# Patient Record
Sex: Female | Born: 1995
Health system: Southern US, Community
[De-identification: ages and names within clinical notes are randomized; demographics above are authoritative.]

## PROBLEM LIST (undated history)

## (undated) ENCOUNTER — Ambulatory Visit: Admission: EM | Discharge status: Absent without leave | Payer: BC Managed Care – PPO

## (undated) ENCOUNTER — Inpatient Hospital Stay (HOSPITAL_COMMUNITY): Payer: Self-pay

## (undated) DIAGNOSIS — F419 Anxiety disorder, unspecified: Secondary | ICD-10-CM

## (undated) DIAGNOSIS — F32A Depression, unspecified: Secondary | ICD-10-CM

## (undated) DIAGNOSIS — O039 Complete or unspecified spontaneous abortion without complication: Secondary | ICD-10-CM

## (undated) DIAGNOSIS — K219 Gastro-esophageal reflux disease without esophagitis: Secondary | ICD-10-CM

## (undated) DIAGNOSIS — R42 Dizziness and giddiness: Secondary | ICD-10-CM

## (undated) DIAGNOSIS — Z309 Encounter for contraceptive management, unspecified: Secondary | ICD-10-CM

## (undated) HISTORY — DX: Complete or unspecified spontaneous abortion without complication: O03.9

## (undated) HISTORY — DX: Anxiety disorder, unspecified: F41.9

## (undated) HISTORY — DX: Dizziness and giddiness: R42

## (undated) HISTORY — DX: Depression, unspecified: F32.A

## (undated) HISTORY — DX: Gastro-esophageal reflux disease without esophagitis: K21.9

## (undated) HISTORY — DX: Encounter for contraceptive management, unspecified: Z30.9

---
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
## 2016-02-15 ENCOUNTER — Ambulatory Visit (INDEPENDENT_AMBULATORY_CARE_PROVIDER_SITE_OTHER): Payer: BLUE CROSS/BLUE SHIELD | Admitting: Orthopedic Surgery

## 2016-02-15 ENCOUNTER — Ambulatory Visit (INDEPENDENT_AMBULATORY_CARE_PROVIDER_SITE_OTHER): Payer: BLUE CROSS/BLUE SHIELD

## 2016-02-15 ENCOUNTER — Encounter: Payer: Self-pay | Admitting: Orthopedic Surgery

## 2016-02-15 VITALS — BP 110/75 | Ht 62.0 in | Wt 140.0 lb

## 2016-02-15 DIAGNOSIS — M25531 Pain in right wrist: Secondary | ICD-10-CM

## 2016-02-15 DIAGNOSIS — M25331 Other instability, right wrist: Secondary | ICD-10-CM

## 2016-02-15 MED ORDER — ETODOLAC 300 MG PO CAPS
300.0000 mg | ORAL_CAPSULE | Freq: Every day | ORAL | Status: DC
Start: 2016-02-15 — End: 2016-04-25

## 2016-02-15 NOTE — Patient Instructions (Addendum)
We will schedule MRI for you and call you with appt New medication sent to your pharmacy

## 2016-02-15 NOTE — Progress Notes (Signed)
Patient ID: Morgan [name], female   DOB: [birth year], 20 y.o.   MRN: [id number]  Chief Complaint  Patient presents with  . Wrist Pain    Rt wrist pain, no recent injury    HPI Morgan [name] is a 20 y.o. female.  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 She has popping dorsal wrist pain catching giving way inability to hold her hand in extension. She's been on Aleve off and on for 3 years and consistently for 3 weeks nothing is made it better at this point    Revi ew of Systems Review of Systems  She denies any medical problems   No previous surgeries   Family History  Problem Relation Age of Onset  . Diabetes    . Diabetes Paternal Aunt   . Cancer Paternal Aunt   . Diabetes Maternal Grandmother   . Cancer Paternal Grandmother     Social History Social History  Substance Use Topics  . Smoking status: Never Smoker   . Smokeless tobacco: Never Used  . Alcohol Use: No    No Known Allergies  Current Outpatient Prescriptions  Medication Sig Dispense Refill  . norethindrone-ethinyl estradiol (JUNEL FE 1/20) 1-20 MG-MCG tablet Take 1 tablet by mouth daily. 1 Package 11   No current facility-administered medications for this visit.       Physical Exam Physical Exam Blood pressure 110/75, height  (1.575 m), weight 140 lb (63.504 kg). Appearance, there are no abnormalities in terms of appearance the patient was well-developed and well-nourished. The grooming and hygiene were normal.  Mental status orientation, there was normal alertness and orientation Mood pleasant Ambulatory status normal with no assistive  devices  Examination of theright wrist is examined first.  nspecTenderness over the dorsal wrist joint  range of motionpainful but full range of motion pain with extension  tests for stabilWatson test is positive motor strength  5/5 Skin warm dry and intact without laceration or ulceration or erythema Neurologic examination normal sensation Vascular examination normal pulses with warm extremity and normal capillary refill  The opposite extremleft wrist normal range of motion stability and strength normal alignment  Data Review  x-ray show signet ring sign but normal lunate alignment  Right wrist instability  Plan  Splint, Lodine 300 mg daily, MRI with arthrogram right wrist

## 2016-02-21 ENCOUNTER — Telehealth: Payer: Self-pay | Admitting: *Deleted

## 2016-02-21 NOTE — Telephone Encounter (Signed)
PATIENT'S INSURANCE DENIED MRI

## 2016-04-03 ENCOUNTER — Encounter: Payer: Self-pay | Admitting: Adult Health

## 2016-04-03 ENCOUNTER — Ambulatory Visit: Payer: Self-pay | Admitting: Adult Health

## 2016-04-25 ENCOUNTER — Ambulatory Visit (INDEPENDENT_AMBULATORY_CARE_PROVIDER_SITE_OTHER): Payer: BLUE CROSS/BLUE SHIELD | Admitting: Adult Health

## 2016-04-25 ENCOUNTER — Ambulatory Visit: Payer: Self-pay | Admitting: Adult Health

## 2016-04-25 ENCOUNTER — Encounter: Payer: Self-pay | Admitting: Adult Health

## 2016-04-25 VITALS — BP 130/60 | HR 82 | Ht 62.0 in | Wt 143.0 lb

## 2016-04-25 DIAGNOSIS — Z30013 Encounter for initial prescription of injectable contraceptive: Secondary | ICD-10-CM | POA: Diagnosis not present

## 2016-04-25 DIAGNOSIS — Z309 Encounter for contraceptive management, unspecified: Secondary | ICD-10-CM

## 2016-04-25 DIAGNOSIS — Z113 Encounter for screening for infections with a predominantly sexual mode of transmission: Secondary | ICD-10-CM

## 2016-04-25 HISTORY — DX: Encounter for contraceptive management, unspecified: Z30.9

## 2016-04-25 MED ORDER — MEDROXYPROGESTERONE ACETATE 150 MG/ML IM SUSP: 150.0000 mg | INTRAMUSCULAR | Status: DC

## 2016-04-25 NOTE — Progress Notes (Signed)
Subjective:     Patient ID: Morgan [name], female   DOB: 03-Apr-1996, 20 y.o.   MRN: [id number]  HPI Macario CarlsKelsea is a 20 year old biracial female, in wanting to switch from OCs to depo, she forgets to take the pill sometimes, she says she has not had sex since first of April and would like to be checked for GC/CHL. Denies any discharge, itching or burning or odor. See graduates this year.  Review of Systems Patient denies any headaches, hearing loss, fatigue, blurred vision, shortness of breath, chest pain, abdominal pain, problems with bowel movements, urination, or intercourse. No joint pain or mood swings. See HPI. Reviewed past medical,surgical, social and family history. Reviewed medications and allergies.     Objective:   Physical Exam BP 130/60 mmHg  Pulse 82  Ht 5\' 2"  (1.575 m)  Wt 143 lb (64.864 kg)  BMI 26.15 kg/m2  LMP 04/09/2016 Skin warm and dry, no rashes. Neck: mid line trachea, normal thyroid, good ROM, no lymphadenopathy noted. Lungs: clear to ausculation bilaterally. Cardiovascular: regular rate and rhythm. Pelvic: external genitalia is normal in appearance no lesions, vagina:pink with good moisture and rugae,urethra has no lesions or masses noted, cervix:smooth, uterus: normal size, shape and contour, non tender, no masses felt, adnexa: no masses or tenderness noted. Bladder is non tender and no masses felt.  GC/CHL obtained.     Assessment:     Contraceptive management STD screening    Plan:    No sex Continue OCs GC/CHL sent  Rx Depoprovera 150 mg # 1 vial, with 4 refills for Im injection every 12 weeks [facility] am and return in pm for depo(lab order given) Use condoms when has sex

## 2016-04-25 NOTE — Patient Instructions (Signed)
No sex Return in 1 week for stat QHCg in am and depo in pm

## 2016-04-27 LAB — GC/CHLAMYDIA PROBE AMP
Chlamydia trachomatis, NAA: NEGATIVE
Neisseria gonorrhoeae by PCR: NEGATIVE

## 2016-05-02 ENCOUNTER — Ambulatory Visit: Payer: BLUE CROSS/BLUE SHIELD

## 2016-05-02 DIAGNOSIS — Z30013 Encounter for initial prescription of injectable contraceptive: Secondary | ICD-10-CM | POA: Diagnosis not present

## 2016-05-02 LAB — BETA HCG QUANT (REF LAB): hCG Quant: <1 m[IU]/mL

## 2016-08-28 ENCOUNTER — Encounter: Payer: Self-pay | Admitting: Adult Health

## 2016-08-28 ENCOUNTER — Ambulatory Visit (INDEPENDENT_AMBULATORY_CARE_PROVIDER_SITE_OTHER): Payer: BLUE CROSS/BLUE SHIELD | Admitting: Adult Health

## 2016-08-28 VITALS — BP 108/50 | HR 78 | Ht 62.0 in | Wt 141.0 lb

## 2016-08-28 DIAGNOSIS — Z3202 Encounter for pregnancy test, result negative: Secondary | ICD-10-CM | POA: Diagnosis not present

## 2016-08-28 DIAGNOSIS — Z3009 Encounter for other general counseling and advice on contraception: Secondary | ICD-10-CM

## 2016-08-28 DIAGNOSIS — Z113 Encounter for screening for infections with a predominantly sexual mode of transmission: Secondary | ICD-10-CM

## 2016-08-28 LAB — POCT URINE PREGNANCY: Preg Test, Ur: NEGATIVE

## 2016-08-28 NOTE — Patient Instructions (Signed)
Call with period for nexplanon insertion

## 2016-08-28 NOTE — Progress Notes (Signed)
Subjective:     Patient ID: Morgan [name], female   DOB: 11/28/1996, 20 y.o.   MRN: [id number]  HPI Morgan [name] is a 20 year old biracial female, in for STD screening and wants to talk birth control options, either wants nexplanon or depo.   Review of Systems Patient denies any headaches, hearing loss, fatigue, blurred vision, shortness of breath, chest pain, abdominal pain, problems with bowel movements, urination, or intercourse. No joint pain or mood swings.   Reviewed past medical,surgical, social and family history. Reviewed medications and allergies.  Objective:   Physical Exam BP (!) 108/50 (BP Location: Left Arm, Patient Position: Sitting, Cuff Size: Normal)   Pulse 78   Ht 5\' 2"  (1.575 m)   Wt 141 lb (64 kg)   LMP 07/17/2016   BMI 25.79 kg/m UPT negative.  Skin warm and dry.Pelvic: external genitalia is normal in appearance, has razor bumps, vagina: scant discharge without odor,urethra has no lesions or masses noted, cervix:smooth, uterus: normal size, shape and contour, non tender, no masses felt, adnexa: no masses or tenderness noted. Bladder is non tender and no masses felt.  GC/CHL obtained.    Discussed nexplanon and depo, she has had depo before but wants something that is covered by insurance.Use clippers and bar soap in peri area, do not shave.  Face time 15 minutes, with 50% counseling.   Assessment:     Screening examination for STD (sexually transmitted disease) - Plan: GC/Chlamydia Probe Amp, HIV antibody, HSV 2 antibody, IgG, RPR  Encounter for education about contraceptive use  Pregnancy examination or test, negative result - Plan: POCT urine pregnancy     Plan:     GC/CHL sent Check HIV,RPR and HSV 2 Call with next period for nexplanon insertion Will have Angie check insurance on nexplanon and depo   Review handout on nexplanon

## 2016-08-29 LAB — GC/CHLAMYDIA PROBE AMP
Chlamydia trachomatis, NAA: NEGATIVE
NEISSERIA GONORRHOEAE BY PCR: NEGATIVE

## 2016-08-29 LAB — RPR: RPR: NONREACTIVE

## 2016-08-29 LAB — HIV ANTIBODY (ROUTINE TESTING W REFLEX): HIV SCREEN 4TH GENERATION: NONREACTIVE

## 2016-08-29 LAB — HSV 2 ANTIBODY, IGG

## 2016-10-08 ENCOUNTER — Encounter (HOSPITAL_COMMUNITY): Payer: Self-pay | Admitting: Emergency Medicine

## 2016-10-08 ENCOUNTER — Emergency Department (HOSPITAL_COMMUNITY): Payer: BLUE CROSS/BLUE SHIELD

## 2016-10-08 ENCOUNTER — Emergency Department (HOSPITAL_COMMUNITY)
Admission: EM | Admit: 2016-10-08 | Discharge: 2016-10-08 | Disposition: A | Discharge status: Home / Self Care | Payer: BLUE CROSS/BLUE SHIELD | Attending: Emergency Medicine | Admitting: Emergency Medicine

## 2016-10-08 DIAGNOSIS — J209 Acute bronchitis, unspecified: Secondary | ICD-10-CM | POA: Diagnosis not present

## 2016-10-08 DIAGNOSIS — J40 Bronchitis, not specified as acute or chronic: Secondary | ICD-10-CM | POA: Diagnosis not present

## 2016-10-08 DIAGNOSIS — R0602 Shortness of breath: Secondary | ICD-10-CM | POA: Diagnosis not present

## 2016-10-08 DIAGNOSIS — R05 Cough: Secondary | ICD-10-CM | POA: Diagnosis not present

## 2016-10-08 DIAGNOSIS — J9801 Acute bronchospasm: Secondary | ICD-10-CM | POA: Diagnosis not present

## 2016-10-08 MED ORDER — ALBUTEROL SULFATE HFA 108 (90 BASE) MCG/ACT IN AERS
2.0000 | INHALATION_SPRAY | Freq: Once | RESPIRATORY_TRACT | Status: AC
  Administered 2016-10-08: 2 via RESPIRATORY_TRACT
  Filled 2016-10-08: qty 6.7

## 2016-10-08 MED ORDER — DEXAMETHASONE 4 MG PO TABS: 4.0000 mg | ORAL_TABLET | Freq: Two times a day (BID) | ORAL | 0 refills | Status: DC

## 2016-10-08 MED ORDER — PREDNISONE 20 MG PO TABS
40.0000 mg | ORAL_TABLET | Freq: Once | ORAL | Status: AC
  Administered 2016-10-08: 40 mg via ORAL
  Filled 2016-10-08: qty 2

## 2016-10-08 MED ORDER — PROMETHAZINE-PHENYLEPH-CODEINE 6.25-5-10 MG/5ML PO SYRP: 5.0000 mL | ORAL_SOLUTION | Freq: Four times a day (QID) | ORAL | 0 refills | Status: DC

## 2016-10-08 MED ORDER — ONDANSETRON HCL 4 MG PO TABS
4.0000 mg | ORAL_TABLET | Freq: Once | ORAL | Status: AC
  Administered 2016-10-08: 4 mg via ORAL
  Filled 2016-10-08: qty 1

## 2016-10-08 NOTE — Discharge Instructions (Signed)
Your oxygen level is 100%. You seem to have some spasm with prolonged coughing. Please use 2 puffs of albuterol every 4 hours as needed for cough or spasm. Please use Decadron 2 times daily until all taken. Use promethazine codeine cough medication for cough. This medication may cause drowsiness, please use with caution. Please see Dr Rulon SeraE. Hawkins for lung evaluation/management if symptoms continue after using the medications prescribed.

## 2016-10-08 NOTE — ED Triage Notes (Signed)
Pt reports cough x 2 months, non productive. Pt states she has difficulty catching her breath after she coughs, sometimes causing her to vomit.

## 2016-10-08 NOTE — ED Provider Notes (Signed)
[facility] DEPT Provider Note   CSN: [id number] Arrival date & time: 10/08/16  1939     History   Chief Complaint Chief Complaint  Patient presents with  . Cough    HPI Morgan [name] is a 20 y.o. female.  The history is provided by the patient.  Cough  This is a new problem. The current episode started more than 1 week ago. The problem has been gradually worsening. The cough is non-productive. There has been no fever. Associated symptoms include myalgias and shortness of breath. She has tried nothing for the symptoms. Risk factors include travel to endemic areas. She is not a smoker. Her past medical history does not include pneumonia or asthma.    Past Medical History:  Diagnosis Date  . Contraceptive management 04/25/2016    Patient Active Problem List   Diagnosis Date Noted  . Contraceptive management 04/25/2016  . GERD (gastroesophageal reflux disease) 11/18/2013  . Radial styloid fracture 02/24/2013  . Scaphoid fracture of wrist 02/24/2013  . CLOSED FRACTURE OF CUBOID BONE 11/07/2009    History reviewed. No pertinent surgical history.  OB History    Gravida Para Term Preterm AB Living   0 0 0 0 0 0   SAB TAB Ectopic Multiple Live Births   0 0 0 0         Home Medications    Prior to Admission medications   Not on File    Family History Family History  Problem Relation Age of Onset  . Diabetes Paternal Aunt   . Cancer Paternal Aunt   . Alzheimer's disease Paternal Aunt   . Diabetes Maternal Grandmother   . Cancer Paternal Grandmother   . Stroke Paternal Grandmother   . Heart attack Paternal Grandmother   . Diabetes Paternal Grandmother   . Diabetes      Social History Social History  Substance Use Topics  . Smoking status: Never Smoker  . Smokeless tobacco: Never Used  . Alcohol use No     Allergies   Review of patient's allergies indicates no known allergies.   Review of Systems Review of Systems  Respiratory: Positive for  cough and shortness of breath.   Musculoskeletal: Positive for myalgias.  All other systems reviewed and are negative.    Physical Exam Updated Vital Signs BP 121/77 (BP Location: Right Arm)   Pulse 88   Temp 98.6 F (37 C) (Oral)   Resp 17   Ht 5\' 2"  (1.575 m)   Wt 65.8 kg   LMP 09/27/2016   SpO2 100%   BMI 26.52 kg/m   Physical Exam  Constitutional: Vital signs are normal. She appears well-developed and well-nourished. She is active.  HENT:  Head: Normocephalic and atraumatic.  Right Ear: Tympanic membrane, external ear and ear canal normal.  Left Ear: Tympanic membrane, external ear and ear canal normal.  Nose: Nose normal.  Mouth/Throat: Uvula is midline, oropharynx is clear and moist and mucous membranes are normal.  Eyes: Conjunctivae, EOM and lids are normal. Pupils are equal, round, and reactive to light.  Neck: Trachea normal, normal range of motion and phonation normal. Neck supple. Carotid bruit is not present.  Cardiovascular: Normal rate, regular rhythm and normal pulses.   Pulmonary/Chest:  Course breath sounds present. Cough noted with deep breathing. Spasm noted with cough.  Abdominal: Soft. Normal appearance and bowel sounds are normal.  Lymphadenopathy:       Head (right side): No submental, no preauricular and no posterior auricular adenopathy  present.       Head (left side): No submental, no preauricular and no posterior auricular adenopathy present.    She has no cervical adenopathy.  Neurological: She is alert. She has normal strength. No cranial nerve deficit or sensory deficit. GCS eye subscore is 4. GCS verbal subscore is 5. GCS motor subscore is 6.  Skin: Skin is warm and dry.  Psychiatric: Her speech is normal.     ED Treatments / Results  Labs (all labs ordered are listed, but only abnormal results are displayed) Labs Reviewed - No data to display  EKG  EKG Interpretation None       Radiology Dg Chest 2 View  Result Date:  10/08/2016 CLINICAL DATA:  Nonproductive cough and shortness of breath for 2 months, initial encounter EXAM: CHEST  2 VIEW COMPARISON:  None. FINDINGS: The heart size and mediastinal contours are within normal limits. Both lungs are clear. The visualized skeletal structures are unremarkable. IMPRESSION: No active cardiopulmonary disease. Electronically Signed   By: Alcide CleverMark  Lukens M.D.   On: 10/08/2016 20:32    Procedures Procedures (including critical care time)  Medications Ordered in ED Medications - No data to display   Initial Impression / Assessment and Plan / ED Course  I have reviewed the triage vital signs and the nursing notes.  Pertinent labs & imaging results that were available during my care of the patient were reviewed by me and considered in my medical decision making (see chart for details).  Clinical Course    *I have reviewed nursing notes, vital signs, and all appropriate lab and imaging results for this patient.**  Final Clinical Impressions(s) / ED Diagnoses  Vital signs within normal limits. Pulse oximetry is 100% on room air. The patient has spasm with cough, and has to regain her breathing pattern. The patient will be started on albuterol inhaler and short course of steroid. Patient will also receive medication for cough. The patient will see Dr. Ileana Roupit Hawkins for pulmonary evaluation if not improving.    Final diagnoses:  None    New Prescriptions New Prescriptions   No medications on file     Ivery QualeHobson Dorinne Graeff, PA-C 10/08/16 [id number]    Marily MemosJason Mesner, MD 10/08/16 2350

## 2016-10-08 NOTE — ED Notes (Signed)
Signature pad in room not working. Pt given discharge instructions, verbalized understanding.

## 2016-10-21 ENCOUNTER — Emergency Department (HOSPITAL_COMMUNITY): Payer: BLUE CROSS/BLUE SHIELD

## 2016-10-21 ENCOUNTER — Emergency Department (HOSPITAL_COMMUNITY)
Admission: EM | Admit: 2016-10-21 | Discharge: 2016-10-21 | Disposition: A | Discharge status: Home / Self Care | Payer: BLUE CROSS/BLUE SHIELD | Attending: Emergency Medicine | Admitting: Emergency Medicine

## 2016-10-21 ENCOUNTER — Encounter (HOSPITAL_COMMUNITY): Payer: Self-pay | Admitting: *Deleted

## 2016-10-21 DIAGNOSIS — N739 Female pelvic inflammatory disease, unspecified: Secondary | ICD-10-CM | POA: Diagnosis not present

## 2016-10-21 DIAGNOSIS — Z79899 Other long term (current) drug therapy: Secondary | ICD-10-CM | POA: Insufficient documentation

## 2016-10-21 DIAGNOSIS — N12 Tubulo-interstitial nephritis, not specified as acute or chronic: Secondary | ICD-10-CM | POA: Diagnosis not present

## 2016-10-21 DIAGNOSIS — K5900 Constipation, unspecified: Secondary | ICD-10-CM | POA: Diagnosis not present

## 2016-10-21 DIAGNOSIS — R1032 Left lower quadrant pain: Secondary | ICD-10-CM | POA: Diagnosis not present

## 2016-10-21 DIAGNOSIS — R1030 Lower abdominal pain, unspecified: Secondary | ICD-10-CM | POA: Diagnosis present

## 2016-10-21 DIAGNOSIS — N730 Acute parametritis and pelvic cellulitis: Secondary | ICD-10-CM

## 2016-10-21 LAB — COMPREHENSIVE METABOLIC PANEL
ALT: 14 U/L (ref 14–54)
ANION GAP: 6 (ref 5–15)
AST: 13 U/L — ABNORMAL LOW (ref 15–41)
Albumin: 3.1 g/dL — ABNORMAL LOW (ref 3.5–5.0)
Alkaline Phosphatase: 63 U/L (ref 38–126)
BUN: 9 mg/dL (ref 6–20)
CHLORIDE: 103 mmol/L (ref 101–111)
CO2: 25 mmol/L (ref 22–32)
Calcium: 8.3 mg/dL — ABNORMAL LOW (ref 8.9–10.3)
Creatinine, Ser: 0.9 mg/dL (ref 0.44–1.00)
GFR calc non Af Amer: >60 mL/min (ref >60)
Glucose, Bld: 86 mg/dL (ref 65–99)
Potassium: 3.4 mmol/L — ABNORMAL LOW (ref 3.5–5.1)
SODIUM: 134 mmol/L — AB (ref 135–145)
Total Bilirubin: 0.4 mg/dL (ref 0.3–1.2)
Total Protein: 7 g/dL (ref 6.5–8.1)

## 2016-10-21 LAB — URINALYSIS, ROUTINE W REFLEX MICROSCOPIC
Bilirubin Urine: NEGATIVE
GLUCOSE, UA: NEGATIVE mg/dL
HGB URINE DIPSTICK: NEGATIVE
Ketones, ur: NEGATIVE mg/dL
Nitrite: NEGATIVE
SPECIFIC GRAVITY, URINE: 1.02 (ref 1.005–1.030)
pH: 5.5 (ref 5.0–8.0)

## 2016-10-21 LAB — WET PREP, GENITAL
SPERM: NONE SEEN
TRICH WET PREP: NONE SEEN
YEAST WET PREP: NONE SEEN

## 2016-10-21 LAB — CBC WITH DIFFERENTIAL/PLATELET
BASOS PCT: 0 %
Basophils Absolute: 0 10*3/uL (ref 0.0–0.1)
EOS ABS: 0 10*3/uL (ref 0.0–0.7)
EOS PCT: 0 %
HCT: 32.6 % — ABNORMAL LOW (ref 36.0–46.0)
Hemoglobin: 10.9 g/dL — ABNORMAL LOW (ref 12.0–15.0)
LYMPHS PCT: 16 %
Lymphs Abs: 1.6 10*3/uL (ref 0.7–4.0)
MCH: 28.2 pg (ref 26.0–34.0)
MCHC: 33.4 g/dL (ref 30.0–36.0)
MCV: 84.5 fL (ref 78.0–100.0)
MONO ABS: 1.3 10*3/uL — AB (ref 0.1–1.0)
Monocytes Relative: 13 %
NEUTROS ABS: 7.2 10*3/uL (ref 1.7–7.7)
NEUTROS PCT: 71 %
PLATELETS: 196 10*3/uL (ref 150–400)
RBC: 3.86 MIL/uL — AB (ref 3.87–5.11)
RDW: 13.9 % (ref 11.5–15.5)
WBC: 10.2 10*3/uL (ref 4.0–10.5)

## 2016-10-21 LAB — URINE MICROSCOPIC-ADD ON: RBC / HPF: NONE SEEN RBC/hpf (ref 0–5)

## 2016-10-21 LAB — LIPASE, BLOOD: Lipase: 15 U/L (ref 11–51)

## 2016-10-21 LAB — PREGNANCY, URINE: PREG TEST UR: NEGATIVE

## 2016-10-21 MED ORDER — CEFTRIAXONE SODIUM 250 MG IJ SOLR
250.0000 mg | Freq: Once | INTRAMUSCULAR | Status: AC
  Administered 2016-10-21: 250 mg via INTRAMUSCULAR
  Filled 2016-10-21: qty 250

## 2016-10-21 MED ORDER — DOXYCYCLINE HYCLATE 100 MG PO TABS
100.0000 mg | ORAL_TABLET | Freq: Once | ORAL | Status: AC
  Administered 2016-10-21: 100 mg via ORAL
  Filled 2016-10-21: qty 1

## 2016-10-21 MED ORDER — ONDANSETRON HCL 4 MG/2ML IJ SOLN
4.0000 mg | Freq: Once | INTRAMUSCULAR | Status: AC
  Administered 2016-10-21: 4 mg via INTRAVENOUS
  Filled 2016-10-21: qty 2

## 2016-10-21 MED ORDER — METRONIDAZOLE 500 MG PO TABS: 500.0000 mg | ORAL_TABLET | Freq: Two times a day (BID) | ORAL | 0 refills | Status: DC

## 2016-10-21 MED ORDER — LEVOFLOXACIN 500 MG PO TABS: 500.0000 mg | ORAL_TABLET | Freq: Every day | ORAL | 0 refills | Status: DC

## 2016-10-21 MED ORDER — AZITHROMYCIN 250 MG PO TABS
2000.0000 mg | ORAL_TABLET | Freq: Once | ORAL | Status: AC
  Administered 2016-10-21: 2000 mg via ORAL
  Filled 2016-10-21: qty 8

## 2016-10-21 MED ORDER — ONDANSETRON 4 MG PO TBDP: 4.0000 mg | ORAL_TABLET | Freq: Three times a day (TID) | ORAL | 0 refills | Status: DC | PRN

## 2016-10-21 MED ORDER — LIDOCAINE HCL (PF) 1 % IJ SOLN
INTRAMUSCULAR | Status: AC
  Administered 2016-10-21: 5 mL
  Filled 2016-10-21: qty 5

## 2016-10-21 MED ORDER — METRONIDAZOLE 500 MG PO TABS
500.0000 mg | ORAL_TABLET | Freq: Once | ORAL | Status: AC
  Administered 2016-10-21: 500 mg via ORAL
  Filled 2016-10-21: qty 1

## 2016-10-21 MED ORDER — IBUPROFEN 800 MG PO TABS: 800.0000 mg | ORAL_TABLET | Freq: Three times a day (TID) | ORAL | 0 refills | Status: DC | PRN

## 2016-10-21 MED ORDER — SODIUM CHLORIDE 0.9 % IV BOLUS (SEPSIS)
1000.0000 mL | Freq: Once | INTRAVENOUS | Status: AC
  Administered 2016-10-21: 1000 mL via INTRAVENOUS

## 2016-10-21 MED ORDER — KETOROLAC TROMETHAMINE 30 MG/ML IJ SOLN
30.0000 mg | Freq: Once | INTRAMUSCULAR | Status: AC
  Administered 2016-10-21: 30 mg via INTRAVENOUS
  Filled 2016-10-21: qty 1

## 2016-10-21 NOTE — ED Notes (Signed)
Patient returned to room. 

## 2016-10-21 NOTE — ED Triage Notes (Addendum)
Pt reports flank pain that started 4 days ago. Also increase in frequency. _Pt says she also has a headache that started the same time the symptoms started.

## 2016-10-21 NOTE — Discharge Instructions (Signed)
To find a primary care or specialty doctor please call [phone] or [phone] to access "[facility] Find a Doctor Service." ° °You may also go on the [facility] website at [email or web address] ° °There are also multiple [facility], [facility] and [facility] practices throughout the [place] that are frequently accepting new patients. You may find a clinic that is close to your home and contact them. ° °[facility] -  °[address] °[place] [address] °[phone] ° ° °[facility] -  °[address] °[place] [address] [phone] ° ° °[facility] - °[address]  °[place] [address] °[phone] ° ° °

## 2016-10-21 NOTE — ED Notes (Signed)
Report given to Dahlia ByesSusan Frye RN.

## 2016-10-21 NOTE — ED Notes (Signed)
Patient to ultrasound

## 2016-10-21 NOTE — ED Provider Notes (Addendum)
TIME SEEN: 1:40 AM  CHIEF COMPLAINT: Left-sided abdominal pain  HPI: Pt is a 20 y.o. female with no significant past medical history who presents to the emergency department with left-sided lower abdominal pain and flank pain for "months". Worsened over the past 4 days and she has had associated subjective fever, chills, headache, nausea and vomiting. No diarrhea, dysuria or hematuria. No vaginal bleeding or discharge. Last menstrual period was October 27. She has never been pregnant. Has had history of Chlamydia that was treated. No history of abdominal surgeries. No history of kidney stones.  ROS: See HPI Constitutional: no fever  Eyes: no drainage  ENT: no runny nose   Cardiovascular:  no chest pain  Resp: no SOB  GI: no vomiting GU: no dysuria Integumentary: no rash  Allergy: no hives  Musculoskeletal: no leg swelling  Neurological: no slurred speech ROS otherwise negative  PAST MEDICAL HISTORY/PAST SURGICAL HISTORY:  Past Medical History:  Diagnosis Date  . Contraceptive management 04/25/2016    MEDICATIONS:  Prior to Admission medications   Medication Sig Start Date End Date Taking? Authorizing Provider  dexamethasone (DECADRON) 4 MG tablet Take 1 tablet (4 mg total) by mouth 2 (two) times daily with a meal. 10/08/16   Ivery QualeHobson Bryant, PA-C  Promethazine-Phenyleph-Codeine 6.25-5-10 MG/5ML SYRP Take 5 mLs by mouth every 6 (six) hours. 10/08/16   Ivery QualeHobson Bryant, PA-C    ALLERGIES:  No Known Allergies  SOCIAL HISTORY:  Social History  Substance Use Topics  . Smoking status: Never Smoker  . Smokeless tobacco: Never Used  . Alcohol use No    FAMILY HISTORY: Family History  Problem Relation Age of Onset  . Diabetes Paternal Aunt   . Cancer Paternal Aunt   . Alzheimer's disease Paternal Aunt   . Diabetes Maternal Grandmother   . Cancer Paternal Grandmother   . Stroke Paternal Grandmother   . Heart attack Paternal Grandmother   . Diabetes Paternal Grandmother   .  Diabetes      EXAM: BP 103/91 (BP Location: Left Arm)   Pulse 96   Temp 99.2 F (37.3 C) (Oral)   Resp 20   Ht 5\' 2"  (1.575 m)   Wt 145 lb (65.8 kg)   LMP 10/17/2016   SpO2 98%   BMI 26.52 kg/m  CONSTITUTIONAL: Alert and oriented and responds appropriately to questions. Well-appearing; well-nourished HEAD: Normocephalic EYES: Conjunctivae clear, PERRL ENT: normal nose; no rhinorrhea; moist mucous membranes NECK: Supple, no meningismus, no LAD, no nuchal rigidity  CARD: RRR; S1 and S2 appreciated; no murmurs, no clicks, no rubs, no gallops RESP: Normal chest excursion without splinting or tachypnea; breath sounds clear and equal bilaterally; no wheezes, no rhonchi, no rales, no hypoxia or respiratory distress, speaking full sentences ABD/GI: Normal bowel sounds; non-distended; soft, tender throughout her left abdomen with some intermittent voluntary guarding, no rebound, no peritoneal signs GU:  Normal external genitalia. No lesions, rashes noted. Patient has no vaginal bleeding on exam. No significant vaginal discharge.  No adnexal mass or fullness. Patient has very minimal right adnexal tenderness. She has significant cervical motion tenderness and left adnexal tenderness. Cervix is not appear friable.  Cervix is closed.  Chaperone present for exam. BACK:  The back appears normal and is non-tender to palpation, there is no CVA tenderness EXT: Normal ROM in all joints; non-tender to palpation; no edema; normal capillary refill; no cyanosis, no calf tenderness or swelling    SKIN: Normal color for age and race; warm; no rash NEURO:  Moves all extremities equally, sensation to light touch intact diffusely, cranial nerves II through XII intact PSYCH: The patient's mood and manner are appropriate. Grooming and personal hygiene are appropriate.  MEDICAL DECISION MAKING: Patient here with left-sided abdominal pain that she states she has had for months. Worsened over the past several days.  Differential diagnosis includes UTI, pyelonephritis, kidney stone, less likely diverticulitis. She is not tender at McBurney's point. Doubt appendicitis. Patient also seems to have some pelvic tenderness on exam but no genital symptoms. We'll perform pelvic exam. Will treat symptoms with IV fluids, Toradol, Zofran and reassess.  ED PROGRESS: 2:30 AM  Patient has left adnexal tenderness on exam. Still diffusely tender throughout her entire left abdomen and into her left flank. I feel she will need an ultrasound to rule out ectopic but also a CT scan to evaluate for possible kidney stone, diverticulitis, colitis, pyelonephritis. Urine shows leukocytes and many bacteria but no other sign of infection. We'll send urine culture.  3:05 AM  Pt's labs are unremarkable. Wet prep is positive for clue cells. We'll treat with Flagyl. We'll also cover for PID with IM ceftriaxone and doxycycline.  3:45 AM Pt's CT scan shows mild edema of the left kidney and minimal fullness of the renal collecting system with no hydronephrosis. No stone identified but this could represent pyelonephritis versus recently passed stone. Her urine shows trace protein and trace leukocytes with no red blood cells, only 0-5 white blood cells and many bacteria. Culture has been sent.  5:15 AM Pt's transvaginal ultrasound shows no acute abnormalities. Normal flow to both ovaries. Given her urine has large amount of bacteria, subjective fevers and chills, I am concerned for possible pyelonephritis rather than kidney stone. She has no known history of kidney stone. [facility]  so that I can start her on appropriate regimen to cover PID, bacterial vaginosis and pyelonephritis. He recommends giving patient 2 g of azithromycin now and then discharging her on Levaquin 500 mg once a day for 2 weeks as well as Flagyl 500 mg twice a day for 1 week. This would cover her for bacterial vaginosis, pyelonephritis and  PID. She reports feeling better after Toradol and Zofran. Drinking without difficulty. No further vomiting. Will give her outpatient PCP and OB/GYN follow-up. Have advised her to avoid sexual activity for the next 2 weeks. Discussed with her return precautions. We'll discharge with prescriptions of ibuprofen for pain and Zofran for nausea. She is comfortable with this plan.   At this time, I do not feel there is any life-threatening condition present. I have reviewed and discussed all results (EKG, imaging, lab, urine as appropriate), exam findings with patient/family. I have reviewed nursing notes and appropriate previous records.  I feel the patient is safe to be discharged home without further emergent workup and can continue workup as an outpatient as needed. Discussed usual and customary return precautions. Patient/family verbalize understanding and are comfortable with this plan.  Outpatient follow-up has been provided. All questions have been answered.    Layla MawKristen N Ward, DO 10/21/16 [id number]    Layla MawKristen N Ward, DO 10/21/16 [id number]

## 2016-10-22 LAB — GC/CHLAMYDIA PROBE AMP (~~LOC~~) NOT AT ARMC
Chlamydia: NEGATIVE
Neisseria Gonorrhea: NEGATIVE

## 2016-10-24 LAB — URINE CULTURE: Culture: >=100000 — AB

## 2016-10-25 ENCOUNTER — Telehealth (HOSPITAL_COMMUNITY): Payer: Self-pay

## 2016-10-25 NOTE — Telephone Encounter (Signed)
Post ED Visit - Positive Culture Follow-up  Culture report reviewed by antimicrobial stewardship pharmacist:  []  Enzo BiNathan Batchelder, Pharm.D. []  Celedonio MiyamotoJeremy Frens, Pharm.D., BCPS []  Garvin FilaMike Maccia, Pharm.D. []  Georgina PillionElizabeth Martin, Pharm.D., BCPS []  BookerMinh Pham, [address]Pharm[address]D., BCPS, AAHIVP []  Estella HuskMichelle Turner, Pharm.D., BCPS, AAHIVP []  Tennis Mustassie Stewart, Pharm.D. []  Sherle Poeob Vincent, [address]Pharm[address] Candida PeelingX  Caroline Welles, Pharm.D.  Positive urine culture, >/= 100,000 colonies -> E Coli Treated with Levofloxacin, organism sensitive to the same and no further patient follow-up is required at this time.  Arvid RightClark, Khloe Hunkele Dorn 10/25/2016, 10:13 AM

## 2016-11-07 ENCOUNTER — Ambulatory Visit (INDEPENDENT_AMBULATORY_CARE_PROVIDER_SITE_OTHER): Payer: BLUE CROSS/BLUE SHIELD | Admitting: Obstetrics and Gynecology

## 2016-11-07 ENCOUNTER — Encounter: Payer: Self-pay | Admitting: Obstetrics and Gynecology

## 2016-11-07 DIAGNOSIS — Z113 Encounter for screening for infections with a predominantly sexual mode of transmission: Secondary | ICD-10-CM

## 2016-11-07 DIAGNOSIS — N10 Acute tubulo-interstitial nephritis: Secondary | ICD-10-CM | POA: Insufficient documentation

## 2016-11-07 NOTE — Progress Notes (Signed)
Patient ID: Morgan [name], female   DOB: 12-Feb-1996, 20 y.o.   MRN: [id number]   [facility]  @DATE @            [name] MRN [id number]  Date of birth: 12-Feb-1996  CC & HPI:   Chief Complaint  Patient presents with  . Follow-up    seemER on 10-21-16/ for PID     Morgan [name] is a 20 y.o. female presenting today for ER f/u on 10/21/16. Urine culture noted E. Coli growth. She was treated for pyelonephritis and covered for PID and BV with 2 g Azithromycin, bid 500 mg Levaquin x2w and qd 500 mg Flagyl x1w. Pt states her pain has resolved since treatment. She states she is sexually active and her birth control method is intermittent condom use. LMP ~10/12/16.   ROS:  ROS No complaints, symptoms resolved   Pertinent History Reviewed:   Reviewed Medical         Past Medical History:  Diagnosis Date  . Contraceptive management 04/25/2016                              Surgical Hx:   History reviewed. No pertinent surgical history. Medications: Reviewed & Updated - see associated section                      No current outpatient prescriptions on file.   Social History: Reviewed -  reports that she has never smoked. She has never used smokeless tobacco.  Objective Findings:  Vitals: Blood pressure 100/80, pulse 88, height 5\' 2"  (1.575 m), weight 143 lb (64.9 kg), last menstrual period 10/17/2016.  Physical Examination: General appearance - alert, well appearing, and in no distress Mental status - alert, oriented to person, place, and time Abdomen - soft, nontender, nondistended, no masses or organomegaly Pelvic -  VULVA: normal appearing vulva with no masses, tenderness or lesions,  VAGINA: normal appearing vagina with normal color and discharge, no lesions,  CERVIX: normal appearing cervix without discharge or lesions, normal appearing cervix  UTERUS: uterus is normal size, shape, consistency and nontender,  ADNEXA: normal adnexa in size,  nontender and no masses   Assessment & Plan:   A:  1. F/u from ER for tx of pyelonephritis resolved Pyelo 2. GC/CHL, urine preg negative in ED       sti screen completed, also proof of cure from [facility] P:  1. Pt to sign up for MyChart for results  2. Obtain HIV/RPR   By signing my name below, I, Doreatha MartinEva Mathews, attest that this documentation has been prepared under the direction and in the presence of Tilda BurrowJohn V Ivanell Deshotel, MD. Electronically Signed: Doreatha MartinEva Mathews, ED Scribe. 11/07/16. 11:06 AM. I personally performed the services described in this documentation, which was SCRIBED in my presence. The recorded information has been reviewed and considered accurate. It has been edited as necessary during review. Tilda BurrowFERGUSON,Palmer Fahrner V, MD   jfs

## 2016-11-08 LAB — RPR: RPR Ser Ql: NONREACTIVE

## 2016-11-08 LAB — HIV ANTIBODY (ROUTINE TESTING W REFLEX): HIV SCREEN 4TH GENERATION: NONREACTIVE

## 2017-01-25 ENCOUNTER — Ambulatory Visit: Payer: BLUE CROSS/BLUE SHIELD | Admitting: Adult Health

## 2017-01-30 ENCOUNTER — Ambulatory Visit (INDEPENDENT_AMBULATORY_CARE_PROVIDER_SITE_OTHER): Payer: BLUE CROSS/BLUE SHIELD | Admitting: Adult Health

## 2017-01-30 ENCOUNTER — Encounter: Payer: Self-pay | Admitting: Adult Health

## 2017-01-30 VITALS — BP 110/64 | HR 72 | Ht 62.0 in | Wt 136.8 lb

## 2017-01-30 DIAGNOSIS — Z30013 Encounter for initial prescription of injectable contraceptive: Secondary | ICD-10-CM

## 2017-01-30 DIAGNOSIS — N941 Unspecified dyspareunia: Secondary | ICD-10-CM | POA: Diagnosis not present

## 2017-01-30 DIAGNOSIS — N898 Other specified noninflammatory disorders of vagina: Secondary | ICD-10-CM | POA: Diagnosis not present

## 2017-01-30 DIAGNOSIS — Z113 Encounter for screening for infections with a predominantly sexual mode of transmission: Secondary | ICD-10-CM | POA: Diagnosis not present

## 2017-01-30 DIAGNOSIS — Z8742 Personal history of other diseases of the female genital tract: Secondary | ICD-10-CM

## 2017-01-30 MED ORDER — MEDROXYPROGESTERONE ACETATE 150 MG/ML IM SUSP: 150.0000 mg | INTRAMUSCULAR | 4 refills | Status: DC

## 2017-01-30 NOTE — Progress Notes (Signed)
Subjective:     Patient ID: Morgan NuttingKelsea A Karstens, female   DOB: [birth year], 21 y.o.   MRN: [id number]  HPI Macario CarlsKelsea is a 21 year old female in complaining of vaginal discharge with odor about 2 weeks ago, not now.She wants to get on depo too, has been using condoms.  Review of Systems Had vaginal discharge with odor 2 weeks ago Pain with sex at times  Reviewed past medical,surgical, social and family history. Reviewed medications and allergies.     Objective:   Physical Exam BP 110/64 (BP Location: Right Arm, Patient Position: Sitting, Cuff Size: Normal)   Pulse 72   Ht 5\' 2"  (1.575 m)   Wt 136 lb 12.8 oz (62.1 kg)   LMP 01/06/2017 (Exact Date)   BMI 25.02 kg/m  Skin warm and dry. Lungs: clear to ausculation bilaterally. Cardiovascular: regular rate and rhythm. Pelvic: external genitalia is normal in appearance no lesions, vagina: scant discharge without odor,urethra has no lesions or masses noted, cervix:smooth, uterus: normal size, shape and contour, non tender, no masses felt, adnexa: no masses or tenderness noted. Bladder is non tender and no masses felt.  GC/CHL obtained. Change positions with sex and try good lubricate.    PHQ 9 score 8, says not depressed but maybe sometimes, declines meds.Denies any suicidal ideations.  Assessment:     1. Encounter for initial prescription of injectable contraceptive   2. Screening for STD (sexually transmitted disease)   3.       History of vaginal discharge, with odor     Plan:     GC/CHL sent Meds ordered this encounter  Medications  . medroxyPROGESTERone (DEPO-PROVERA) 150 MG/ML injection    Sig: Inject 1 mL (150 mg total) into the muscle every 3 (three) months.    Dispense:  1 mL    Refill:  4    Order Specific Question:   Supervising Provider    Answer:   Lazaro ArmsEURE, LUTHER H [[id number]]  Call with period for depo Call if discharge with odor returns

## 2017-02-01 LAB — GC/CHLAMYDIA PROBE AMP
Chlamydia trachomatis, NAA: NEGATIVE
NEISSERIA GONORRHOEAE BY PCR: NEGATIVE

## 2017-03-11 ENCOUNTER — Ambulatory Visit (INDEPENDENT_AMBULATORY_CARE_PROVIDER_SITE_OTHER): Payer: BLUE CROSS/BLUE SHIELD | Admitting: *Deleted

## 2017-03-11 ENCOUNTER — Encounter: Payer: Self-pay | Admitting: Obstetrics and Gynecology

## 2017-03-11 ENCOUNTER — Encounter: Payer: Self-pay | Admitting: *Deleted

## 2017-03-11 DIAGNOSIS — Z3202 Encounter for pregnancy test, result negative: Secondary | ICD-10-CM | POA: Diagnosis not present

## 2017-03-11 DIAGNOSIS — Z3042 Encounter for surveillance of injectable contraceptive: Secondary | ICD-10-CM

## 2017-03-11 DIAGNOSIS — Z308 Encounter for other contraceptive management: Secondary | ICD-10-CM

## 2017-03-11 HISTORY — PX: ROOT CANAL: SHX2363

## 2017-03-11 LAB — POCT URINE PREGNANCY: PREG TEST UR: NEGATIVE

## 2017-03-11 MED ORDER — MEDROXYPROGESTERONE ACETATE 150 MG/ML IM SUSP
150.0000 mg | Freq: Once | INTRAMUSCULAR | Status: AC
  Administered 2017-03-11: 150 mg via INTRAMUSCULAR

## 2017-03-11 NOTE — Progress Notes (Signed)
Pt here for Depo. Pt tolerated shot well. Return in 12 weeks for next shot. JSY 

## 2017-04-09 ENCOUNTER — Encounter: Payer: Self-pay | Admitting: Adult Health

## 2017-04-09 ENCOUNTER — Ambulatory Visit (INDEPENDENT_AMBULATORY_CARE_PROVIDER_SITE_OTHER): Payer: BLUE CROSS/BLUE SHIELD | Admitting: Adult Health

## 2017-04-09 VITALS — BP 118/74 | HR 73 | Ht 62.0 in | Wt 137.0 lb

## 2017-04-09 DIAGNOSIS — B9689 Other specified bacterial agents as the cause of diseases classified elsewhere: Secondary | ICD-10-CM | POA: Insufficient documentation

## 2017-04-09 DIAGNOSIS — R300 Dysuria: Secondary | ICD-10-CM | POA: Insufficient documentation

## 2017-04-09 DIAGNOSIS — N898 Other specified noninflammatory disorders of vagina: Secondary | ICD-10-CM | POA: Diagnosis not present

## 2017-04-09 DIAGNOSIS — Z113 Encounter for screening for infections with a predominantly sexual mode of transmission: Secondary | ICD-10-CM | POA: Diagnosis not present

## 2017-04-09 DIAGNOSIS — N390 Urinary tract infection, site not specified: Secondary | ICD-10-CM | POA: Diagnosis not present

## 2017-04-09 DIAGNOSIS — N760 Acute vaginitis: Secondary | ICD-10-CM | POA: Insufficient documentation

## 2017-04-09 DIAGNOSIS — Z1389 Encounter for screening for other disorder: Secondary | ICD-10-CM

## 2017-04-09 LAB — POCT URINALYSIS DIPSTICK
GLUCOSE UA: NEGATIVE
KETONES UA: NEGATIVE
Nitrite, UA: POSITIVE
Protein, UA: NEGATIVE
RBC UA: NEGATIVE

## 2017-04-09 LAB — POCT WET PREP (WET MOUNT)
Clue Cells Wet Prep Whiff POC: POSITIVE
WBC, Wet Prep HPF POC: POSITIVE

## 2017-04-09 MED ORDER — SULFAMETHOXAZOLE-TRIMETHOPRIM 800-160 MG PO TABS: 1.0000 | ORAL_TABLET | Freq: Two times a day (BID) | ORAL | 0 refills | Status: DC

## 2017-04-09 MED ORDER — METRONIDAZOLE 500 MG PO TABS: 500.0000 mg | ORAL_TABLET | Freq: Two times a day (BID) | ORAL | 0 refills | Status: DC

## 2017-04-09 NOTE — Patient Instructions (Signed)
Bacterial Vaginosis Bacterial vaginosis is a vaginal infection that occurs when the normal balance of bacteria in the vagina is disrupted. It results from an overgrowth of certain bacteria. This is the most common vaginal infection among women ages 15-44. Because bacterial vaginosis increases your risk for STIs (sexually transmitted infections), getting treated can help reduce your risk for chlamydia, gonorrhea, herpes, and HIV (human immunodeficiency virus). Treatment is also important for preventing complications in pregnant women, because this condition can cause an early (premature) delivery. What are the causes? This condition is caused by an increase in harmful bacteria that are normally present in small amounts in the vagina. However, the reason that the condition develops is not fully understood. What increases the risk? The following factors may make you more likely to develop this condition:  Having a new sexual partner or multiple sexual partners.  Having unprotected sex.  Douching.  Having an intrauterine device (IUD).  Smoking.  Drug and alcohol abuse.  Taking certain antibiotic medicines.  Being pregnant.  You cannot get bacterial vaginosis from toilet seats, bedding, swimming pools, or contact with objects around you. What are the signs or symptoms? Symptoms of this condition include:  Grey or white vaginal discharge. The discharge can also be watery or foamy.  A fish-like odor with discharge, especially after sexual intercourse or during menstruation.  Itching in and around the vagina.  Burning or pain with urination.  Some women with bacterial vaginosis have no signs or symptoms. How is this diagnosed? This condition is diagnosed based on:  Your medical history.  A physical exam of the vagina.  Testing a sample of vaginal fluid under a microscope to look for a large amount of bad bacteria or abnormal cells. Your health care provider may use a cotton swab  or a small wooden spatula to collect the sample.  How is this treated? This condition is treated with antibiotics. These may be given as a pill, a vaginal cream, or a medicine that is put into the vagina (suppository). If the condition comes back after treatment, a second round of antibiotics may be needed. Follow these instructions at home: Medicines  Take over-the-counter and prescription medicines only as told by your health care provider.  Take or use your antibiotic as told by your health care provider. Do not stop taking or using the antibiotic even if you start to feel better. General instructions  If you have a female sexual partner, tell her that you have a vaginal infection. She should see her health care provider and be treated if she has symptoms. If you have a female sexual partner, he does not need treatment.  During treatment: ? Avoid sexual activity until you finish treatment. ? Do not douche. ? Avoid alcohol as directed by your health care provider. ? Avoid breastfeeding as directed by your health care provider.  Drink enough water and fluids to keep your urine clear or pale yellow.  Keep the area around your vagina and rectum clean. ? Wash the area daily with warm water. ? Wipe yourself from front to back after using the toilet.  Keep all follow-up visits as told by your health care provider. This is important. How is this prevented?  Do not douche.  Wash the outside of your vagina with warm water only.  Use protection when having sex. This includes latex condoms and dental dams.  Limit how many sexual partners you have. To help prevent bacterial vaginosis, it is best to have sex with just   one partner (monogamous).  Make sure you and your sexual partner are tested for STIs.  Wear cotton or cotton-lined underwear.  Avoid wearing tight pants and pantyhose, especially during summer.  Limit the amount of alcohol that you drink.  Do not use any products that  contain nicotine or tobacco, such as cigarettes and e-cigarettes. If you need help quitting, ask your health care provider.  Do not use illegal drugs. Where to find more information:  Centers for Disease Control and Prevention: www.cdc.gov/std  American Sexual Health Association (ASHA): www.ashastd.org  [place] Department of Health and Human Services, Office on Women's Health: www.womenshealth.gov/ or https://www.womenshealth.gov/a-z-topics/bacterial-vaginosis Contact a health care provider if:  Your symptoms do not improve, even after treatment.  You have more discharge or pain when urinating.  You have a fever.  You have pain in your abdomen.  You have pain during sex.  You have vaginal bleeding between periods. Summary  Bacterial vaginosis is a vaginal infection that occurs when the normal balance of bacteria in the vagina is disrupted.  Because bacterial vaginosis increases your risk for STIs (sexually transmitted infections), getting treated can help reduce your risk for chlamydia, gonorrhea, herpes, and HIV (human immunodeficiency virus). Treatment is also important for preventing complications in pregnant women, because the condition can cause an early (premature) delivery.  This condition is treated with antibiotic medicines. These may be given as a pill, a vaginal cream, or a medicine that is put into the vagina (suppository). This information is not intended to replace advice given to you by your health care provider. Make sure you discuss any questions you have with your health care provider. Document Released: 12/10/2005 Document Revised: 08/25/2016 Document Reviewed: 08/25/2016 Elsevier Interactive Patient Education  2017 [employer]  

## 2017-04-09 NOTE — Progress Notes (Signed)
Subjective:     Patient ID: Morgan [name], female   DOB: [birth year], 21 y.o.   MRN: [id number]  HPI Morgan [name] is a 21 year old white female in complaining of burning with urination for about 10 days and vaginal discharge with some odor and itching.   Review of Systems Burning with urination for about 10 days Vaginal discharge with some odor and itching Reviewed past medical,surgical, social and family history. Reviewed medications and allergies.     Objective:   Physical Exam BP 118/74 (BP Location: Right Arm, Patient Position: Sitting, Cuff Size: Normal)   Pulse 73   Ht  (1.575 m)   Wt 137 lb (62.1 kg)   LMP 03/11/2017 (Exact Date)   BMI 25.06 kg/m Urine dipstick: +nitrates, mod leuks.  Skin warm and dry.Pelvic: external genitalia is normal in appearance no lesions, vagina: white discharge with odor, side walls red,urethra has no lesions or masses noted, cervix:smooth, uterus: normal size, shape and contour, non tender, no masses felt, adnexa: no masses or tenderness noted. Bladder is non tender and no masses felt. Wet prep: + for clue cells and +WBCs.     Assessment:     1. Burning with urination   2. Screening for genitourinary condition   3. Vaginal discharge   4. Screening for STD (sexually transmitted disease)   5. Urinary tract infection without hematuria, site unspecified   6. BV (bacterial vaginosis)       Plan:     Meds ordered this encounter  Medications  . sulfamethoxazole-trimethoprim (BACTRIM DS,SEPTRA DS) 800-160 MG tablet    Sig: Take 1 tablet by mouth 2 (two) times daily.    Dispense:  14 tablet    Refill:  0    Order Specific Question:   Supervising Provider    Answer:   Despina Hidden, LUTHER H [[id number]]  . metroNIDAZOLE (FLAGYL) 500 MG tablet    Sig: Take 1 tablet (500 mg total) by mouth 2 (two) times daily.    Dispense:  14 tablet    Refill:  0    Order Specific Question:   Supervising Provider    Answer:   Duane Lope H [[id number]]  UA C&S sent GC/CHL sent on  urine No alcohol or sex while in meds  Review handout on BV Push fluids Use condoms Follow up prn

## 2017-04-10 LAB — GC/CHLAMYDIA PROBE AMP
CHLAMYDIA, DNA PROBE: NEGATIVE
NEISSERIA GONORRHOEAE BY PCR: NEGATIVE

## 2017-04-10 LAB — MICROSCOPIC EXAMINATION: Casts: NONE SEEN /lpf

## 2017-04-10 LAB — URINALYSIS, ROUTINE W REFLEX MICROSCOPIC
Bilirubin, UA: NEGATIVE
Glucose, UA: NEGATIVE
Ketones, UA: NEGATIVE
Nitrite, UA: POSITIVE — AB
PH UA: 5.5 (ref 5.0–7.5)
PROTEIN UA: NEGATIVE
RBC, UA: NEGATIVE
Specific Gravity, UA: 1.023 (ref 1.005–1.030)
UUROB: 0.2 mg/dL (ref 0.2–1.0)

## 2017-04-12 LAB — URINE CULTURE

## 2017-06-03 ENCOUNTER — Ambulatory Visit: Payer: BLUE CROSS/BLUE SHIELD

## 2017-06-10 ENCOUNTER — Ambulatory Visit: Payer: BLUE CROSS/BLUE SHIELD

## 2017-07-18 ENCOUNTER — Ambulatory Visit (INDEPENDENT_AMBULATORY_CARE_PROVIDER_SITE_OTHER): Payer: BLUE CROSS/BLUE SHIELD | Admitting: Adult Health

## 2017-07-18 ENCOUNTER — Encounter: Payer: Self-pay | Admitting: Adult Health

## 2017-07-18 VITALS — BP 100/58 | HR 78 | Ht 62.0 in | Wt 138.5 lb

## 2017-07-18 DIAGNOSIS — N921 Excessive and frequent menstruation with irregular cycle: Secondary | ICD-10-CM | POA: Diagnosis not present

## 2017-07-18 DIAGNOSIS — Z113 Encounter for screening for infections with a predominantly sexual mode of transmission: Secondary | ICD-10-CM

## 2017-07-18 DIAGNOSIS — Z3202 Encounter for pregnancy test, result negative: Secondary | ICD-10-CM

## 2017-07-18 DIAGNOSIS — Z30011 Encounter for initial prescription of contraceptive pills: Secondary | ICD-10-CM

## 2017-07-18 LAB — POCT URINE PREGNANCY: Preg Test, Ur: NEGATIVE

## 2017-07-18 MED ORDER — NORETHIN ACE-ETH ESTRAD-FE 1-20 MG-MCG(24) PO CAPS: 1.0000 | ORAL_CAPSULE | Freq: Every day | ORAL | 3 refills | Status: DC

## 2017-07-18 NOTE — Progress Notes (Signed)
Subjective:     Patient ID: Morgan [name], female   DOB: 1996-09-03, 21 y.o.   MRN: [id number]  HPI Morgan [name] is a 21 year old white female in complaining of bleeding with depo, but did not get last shot and wants to get on OCs, and wants STD testing.   Review of Systems Bleeding with depo No sex lately   Reviewed past medical,surgical, social and family history. Reviewed medications and allergies.     Objective:   Physical Exam BP (!) 100/58 (BP Location: Left Arm, Patient Position: Sitting, Cuff Size: Normal)   Pulse 78   Ht 5\' 2"  (1.575 m)   Wt 138 lb 8 oz (62.8 kg)   BMI 25.33 kg/m UPT negative, Skin warm and dry.Pelvic: external genitalia is normal in appearance no lesions, vagina: tan discharge without odor,urethra has no lesions or masses noted, cervix:smooth, uterus: normal size, shape and contour, non tender, no masses felt, adnexa: no masses or tenderness noted. Bladder is non tender and no masses felt. GC/CHL obtained.     Assessment:     1. Irregular intermenstrual bleeding   2. Screening for STD (sexually transmitted disease)   3. Encounter for initial prescription of contraceptive pills   4. Pregnancy examination or test, negative result       Plan:     Check HIV and RPR GC/CHL sent Meds ordered this encounter  Medications  . Norethin Ace-Eth Estrad-FE (TAYTULLA) 1-20 MG-MCG(24) CAPS    Sig: Take 1 tablet by mouth daily.    Dispense:  84 capsule    Refill:  3    Order Specific Question:   Supervising Provider    Answer:   Lazaro ArmsEURE, LUTHER H [[id number]]  Start today,3 packs given,  use condoms if has sex Follow up prn

## 2017-07-19 LAB — RPR: RPR: NONREACTIVE

## 2017-07-19 LAB — HIV ANTIBODY (ROUTINE TESTING W REFLEX): HIV Screen 4th Generation wRfx: NONREACTIVE

## 2017-07-20 LAB — GC/CHLAMYDIA PROBE AMP
CHLAMYDIA, DNA PROBE: NEGATIVE
NEISSERIA GONORRHOEAE BY PCR: NEGATIVE

## 2017-08-19 ENCOUNTER — Emergency Department (HOSPITAL_COMMUNITY)
Admission: EM | Admit: 2017-08-19 | Discharge: 2017-08-20 | Disposition: A | Discharge status: Home / Self Care | Payer: BLUE CROSS/BLUE SHIELD | Attending: Emergency Medicine | Admitting: Emergency Medicine

## 2017-08-19 ENCOUNTER — Encounter (HOSPITAL_COMMUNITY): Payer: Self-pay | Admitting: Emergency Medicine

## 2017-08-19 DIAGNOSIS — R102 Pelvic and perineal pain: Secondary | ICD-10-CM | POA: Diagnosis present

## 2017-08-19 DIAGNOSIS — Z5321 Procedure and treatment not carried out due to patient leaving prior to being seen by health care provider: Secondary | ICD-10-CM | POA: Insufficient documentation

## 2017-08-19 LAB — URINALYSIS, ROUTINE W REFLEX MICROSCOPIC
Bilirubin Urine: NEGATIVE
GLUCOSE, UA: NEGATIVE mg/dL
Hgb urine dipstick: NEGATIVE
Ketones, ur: NEGATIVE mg/dL
Leukocytes, UA: NEGATIVE
Nitrite: POSITIVE — AB
PROTEIN: NEGATIVE mg/dL
SPECIFIC GRAVITY, URINE: 1.028 (ref 1.005–1.030)
pH: 5 (ref 5.0–8.0)

## 2017-08-19 NOTE — ED Triage Notes (Signed)
Patient states she was seen by her OGBYN 2 weeks ago and screened for STDs, diagnosed with bacterial vaginosis, and given Rx for treatment. Patient states the pain is worse and wants to be screened for everything again due to pain and symptoms not improving.

## 2017-08-20 NOTE — ED Notes (Signed)
Informed registration she was leaving 

## 2017-11-20 ENCOUNTER — Encounter: Payer: Self-pay | Admitting: Obstetrics and Gynecology

## 2017-11-20 ENCOUNTER — Ambulatory Visit (INDEPENDENT_AMBULATORY_CARE_PROVIDER_SITE_OTHER): Payer: BLUE CROSS/BLUE SHIELD | Admitting: Obstetrics and Gynecology

## 2017-11-20 ENCOUNTER — Other Ambulatory Visit: Payer: Self-pay

## 2017-11-20 VITALS — BP 102/60 | HR 69 | Ht 62.0 in | Wt 140.0 lb

## 2017-11-20 DIAGNOSIS — N61 Mastitis without abscess: Secondary | ICD-10-CM

## 2017-11-20 DIAGNOSIS — N898 Other specified noninflammatory disorders of vagina: Secondary | ICD-10-CM

## 2017-11-20 DIAGNOSIS — R350 Frequency of micturition: Secondary | ICD-10-CM

## 2017-11-20 LAB — POCT URINALYSIS DIPSTICK
Glucose, UA: NEGATIVE
KETONES UA: NEGATIVE
Leukocytes, UA: NEGATIVE
Nitrite, UA: NEGATIVE
PROTEIN UA: NEGATIVE
RBC UA: NEGATIVE

## 2017-11-20 LAB — POCT WET PREP (WET MOUNT): KOH WET PREP POC: NEGATIVE

## 2017-11-20 MED ORDER — CEPHALEXIN 500 MG PO CAPS: 500.0000 mg | ORAL_CAPSULE | Freq: Four times a day (QID) | ORAL | 0 refills | Status: DC

## 2017-11-20 NOTE — Progress Notes (Signed)
The note JVF this date

## 2017-11-20 NOTE — Progress Notes (Signed)
Patient ID: Morgan [name], female   DOB: 03/22/1996, 21 y.o.   MRN: [id number]   [facility]  @DATE @            [name] MRN [id number]  Date of birth: 03/22/1996  CC & HPI:  Morgan [name] is a 21 y.o. female who complains that she has urinary frequency. Associated symptoms include a incomplete voiding, nocturia x 2, and a white, clumpy vaginal discharge. She also complains of a lump to her right breast that was found by her mother around three week ago. The patient reports having her nipples pierced in December 2017 and notes no infections to the piercing sites. She has noticed a mild discharge to the site. The lump to her breast has stayed the same size, except for when her breasts swell. She endorses pain to palpation to her breast. She is currently on birth control pills. The patient has not had intercourse for the last few months and was last checked for STD's on 07/18/2017, which resulted negative. She denies any other symptoms or complaints at this time.   ROS:  ROS +urinary frequency +white, clumpy vaginal discharge +lump to right breast +right breast discharge All systems are negative except as noted in the HPI and PMH.   Pertinent History Reviewed:   Reviewed Medical         Past Medical History:  Diagnosis Date  . Contraceptive management 04/25/2016                              Surgical Hx:    Past Surgical History:  Procedure Laterality Date  . ROOT CANAL  03/11/2017   Medications: Reviewed & Updated - see associated section                       Current Outpatient Medications:  .  ibuprofen (ADVIL,MOTRIN) 800 MG tablet, Take 800 mg by mouth every 8 (eight) hours as needed., Disp: , Rfl:  .  Norethin Ace-Eth Estrad-FE (TAYTULLA) 1-20 MG-MCG(24) CAPS, Take 1 tablet by mouth daily., Disp: 84 capsule, Rfl: 3   Social History: Reviewed -  reports that  has never smoked. she has never used smokeless tobacco.  Objective Findings:   Vitals: There were no vitals taken for this visit.  PHYSICAL EXAMINATION General appearance - alert, well appearing, and in no distress and oriented to person, place, and time Mental status - alert, oriented to person, place, and time, normal mood, behavior, speech, dress, motor activity, and thought processes, affect appropriate to mood Breasts - industrial in each nipple, with right nipple inflamed and discharge at industrial, tenderness towards axilla, no redness  PELVIC External genitalia - normal Vagina - clear, whitish discharge, normal range Wet [place] - Epitheliales only, no clue cells, KOH negative, no yeast   Assessment & Plan:   A:  1. Mastitis nipple 2. No evidence of UTI or vaginal infection  P:  1. Neosporin to right breast 2. Rx Keflex  3. F/u PRN  By signing my name below, I, Diona BrownerJennifer Gorman, attest that this documentation has been prepared under the direction and in the presence of Tilda BurrowFerguson, Cayson Kalb V, MD. Electronically Signed: Diona BrownerJennifer Gorman, Medical Scribe. 11/20/17. 1:22 PM.  I personally performed the services described in this documentation, which was SCRIBED in my presence. The recorded information has been reviewed and considered accurate. It has been edited as necessary  during review. Jonnie Kind, MD

## 2018-02-10 ENCOUNTER — Emergency Department (HOSPITAL_COMMUNITY)
Admission: EM | Admit: 2018-02-10 | Discharge: 2018-02-10 | Disposition: A | Discharge status: Home / Self Care | Payer: BLUE CROSS/BLUE SHIELD | Attending: Emergency Medicine | Admitting: Emergency Medicine

## 2018-02-10 ENCOUNTER — Other Ambulatory Visit: Payer: Self-pay

## 2018-02-10 ENCOUNTER — Encounter (HOSPITAL_COMMUNITY): Payer: Self-pay | Admitting: Emergency Medicine

## 2018-02-10 DIAGNOSIS — R05 Cough: Secondary | ICD-10-CM | POA: Insufficient documentation

## 2018-02-10 DIAGNOSIS — J3489 Other specified disorders of nose and nasal sinuses: Secondary | ICD-10-CM | POA: Diagnosis not present

## 2018-02-10 DIAGNOSIS — R0982 Postnasal drip: Secondary | ICD-10-CM | POA: Diagnosis not present

## 2018-02-10 DIAGNOSIS — R059 Cough, unspecified: Secondary | ICD-10-CM

## 2018-02-10 MED ORDER — AMOXICILLIN-POT CLAVULANATE 875-125 MG PO TABS: 1.0000 | ORAL_TABLET | Freq: Two times a day (BID) | ORAL | 0 refills | Status: DC

## 2018-02-10 MED ORDER — ONDANSETRON HCL 4 MG PO TABS: 4.0000 mg | ORAL_TABLET | Freq: Three times a day (TID) | ORAL | 0 refills | Status: DC | PRN

## 2018-02-10 MED ORDER — BENZONATATE 100 MG PO CAPS: 100.0000 mg | ORAL_CAPSULE | Freq: Three times a day (TID) | ORAL | 0 refills | Status: DC | PRN

## 2018-02-10 NOTE — ED Triage Notes (Signed)
Pt complaining of flu like symptoms since Tuesday. Pt states sister was diagnosed with flu.

## 2018-02-10 NOTE — ED Provider Notes (Signed)
Emergency Department Provider Note   I have reviewed the triage vital signs and the nursing notes.   HISTORY  Chief Complaint Cough   HPI Morgan [name] is a 22 y.o. female without significant past medical history presents the emergency department with multiple complaints.  It seems that the main reason why she is here is that she is had almost a worth of cough.  Is been associated with post nasal drip, rhinorrhea, sinus pain.  She has not measured her temperature but she has felt febrile multiple times during this.  She states that she feels that she is continually getting worse and not getting any better.  States this feels similar to previous sinus infections.  She does not smoke or have a history of any drug use.  She does have a sister who tested positive for influenza and she did not get a flu shot this year.  She does not have any body aches or joint aches.  No rashes.  Has had vomiting a couple times secondary to the drainage.  She is tried Claritin, DayQuil and NyQuil without relief. No other associated or modifying symptoms.    Past Medical History:  Diagnosis Date  . Contraceptive management 04/25/2016    Patient Active Problem List   Diagnosis Date Noted  . Burning with urination 04/09/2017  . Vaginal discharge 04/09/2017  . Urinary tract infection without hematuria 04/09/2017  . BV (bacterial vaginosis) 04/09/2017  . Pyelonephritis, acute 11/07/2016  . Screening for STD (sexually transmitted disease) 11/07/2016  . Contraceptive management 04/25/2016  . GERD (gastroesophageal reflux disease) 11/18/2013  . Radial styloid fracture 02/24/2013  . Scaphoid fracture of wrist 02/24/2013  . CLOSED FRACTURE OF CUBOID BONE 11/07/2009    Past Surgical History:  Procedure Laterality Date  . ROOT CANAL  03/11/2017    Current Outpatient Rx  . Order #: [id number] Class: Print  . Order #: [id number] Class: Print  . Order #: [id number] Class: Normal  . Order #: [id number] Class:  Print    Allergies Patient has no known allergies.  Family History  Problem Relation Age of Onset  . Diabetes Paternal Aunt   . Cancer Paternal Aunt   . Alzheimer's disease Paternal Aunt   . Diabetes Maternal Grandmother   . Cancer Paternal Grandmother   . Stroke Paternal Grandmother   . Heart attack Paternal Grandmother   . Diabetes Paternal Grandmother   . Diabetes Unknown     Social History Social History   Tobacco Use  . Smoking status: Never Smoker  . Smokeless tobacco: Never Used  Substance Use Topics  . Alcohol use: No  . Drug use: No    Review of Systems  All other systems negative except as documented in the HPI. All pertinent positives and negatives as reviewed in the HPI. ____________________________________________   PHYSICAL EXAM:  VITAL SIGNS: ED Triage Vitals  Enc Vitals Group     BP 02/10/18 0841 116/73     Pulse Rate 02/10/18 0841 79     Resp 02/10/18 0841 14     Temp 02/10/18 0841 98.2 F (36.8 C)     Temp Source 02/10/18 0841 Oral     SpO2 02/10/18 0841 100 %    Constitutional: Alert and oriented. Well appearing and in no acute distress. Eyes: Conjunctivae are normal. PERRL. EOMI. Head: Atraumatic. Nose: + rhinnorhea. Mouth/Throat: Mucous membranes are moist.  Oropharynx posterior erythema. Sinuses: Frontal sinus tenderness.  Neck: No stridor.  No meningeal signs. Mild lymphadenopathy  Cardiovascular: Normal rate,  regular rhythm. Good peripheral circulation. Grossly normal heart sounds.   Respiratory: Normal respiratory effort.  No retractions. Lungs CTAB. Gastrointestinal: Soft and nontender. No distention.  Musculoskeletal: No lower extremity tenderness nor edema. No gross deformities of extremities. Neurologic:  Normal speech and language. No gross focal neurologic deficits are appreciated.  Skin:  Skin is warm, dry and intact. No rash noted.  ____________________________________________   INITIAL IMPRESSION / ASSESSMENT AND  PLAN / ED COURSE  Suspect sinusitis.  She is almost a week into her symptoms so we will give a prescription for antibiotics and she get filled in the next couple days if she is not improving.  We will also try Tessalon and give her a prescription for Zofran to help with her nausea.  I think these are probably related to postnasal drip rather than a primary intra-abdominal pathology.  Pertinent labs & imaging results that were available during my care of the patient were reviewed by me and considered in my medical decision making (see chart for details).  ____________________________________________  FINAL CLINICAL IMPRESSION(S) / ED DIAGNOSES  Final diagnoses:  Cough    MEDICATIONS GIVEN DURING THIS VISIT:  Medications - No data to display   NEW OUTPATIENT MEDICATIONS STARTED DURING THIS VISIT:  New Prescriptions   AMOXICILLIN-CLAVULANATE (AUGMENTIN) 875-125 MG TABLET    Take 1 tablet by mouth 2 (two) times daily. One po bid x 7 days   BENZONATATE (TESSALON) 100 MG CAPSULE    Take 1 capsule (100 mg total) by mouth 3 (three) times daily as needed for cough.   ONDANSETRON (ZOFRAN) 4 MG TABLET    Take 1 tablet (4 mg total) by mouth every 8 (eight) hours as needed for nausea or vomiting.    Note:  This note was prepared with assistance of Dragon voice recognition software. Occasional wrong-word or sound-a-like substitutions may have occurred due to the inherent limitations of voice recognition software.   Georgian Mcclory, Barbara CowerJason, MD 02/10/18 0900

## 2018-02-15 ENCOUNTER — Other Ambulatory Visit: Payer: Self-pay

## 2018-02-15 ENCOUNTER — Emergency Department (HOSPITAL_COMMUNITY): Payer: BLUE CROSS/BLUE SHIELD

## 2018-02-15 ENCOUNTER — Emergency Department (HOSPITAL_COMMUNITY)
Admission: EM | Admit: 2018-02-15 | Discharge: 2018-02-15 | Disposition: A | Discharge status: Home / Self Care | Payer: BLUE CROSS/BLUE SHIELD | Attending: Emergency Medicine | Admitting: Emergency Medicine

## 2018-02-15 ENCOUNTER — Encounter (HOSPITAL_COMMUNITY): Payer: Self-pay | Admitting: Emergency Medicine

## 2018-02-15 DIAGNOSIS — M25522 Pain in left elbow: Secondary | ICD-10-CM | POA: Insufficient documentation

## 2018-02-15 DIAGNOSIS — Y9389 Activity, other specified: Secondary | ICD-10-CM | POA: Insufficient documentation

## 2018-02-15 DIAGNOSIS — M79642 Pain in left hand: Secondary | ICD-10-CM | POA: Diagnosis not present

## 2018-02-15 DIAGNOSIS — S6992XA Unspecified injury of left wrist, hand and finger(s), initial encounter: Secondary | ICD-10-CM | POA: Diagnosis not present

## 2018-02-15 DIAGNOSIS — Y92410 Unspecified street and highway as the place of occurrence of the external cause: Secondary | ICD-10-CM | POA: Insufficient documentation

## 2018-02-15 DIAGNOSIS — R51 Headache: Secondary | ICD-10-CM | POA: Diagnosis not present

## 2018-02-15 DIAGNOSIS — Y999 Unspecified external cause status: Secondary | ICD-10-CM | POA: Diagnosis not present

## 2018-02-15 DIAGNOSIS — S0990XA Unspecified injury of head, initial encounter: Secondary | ICD-10-CM | POA: Diagnosis not present

## 2018-02-15 DIAGNOSIS — S59902A Unspecified injury of left elbow, initial encounter: Secondary | ICD-10-CM | POA: Diagnosis not present

## 2018-02-15 LAB — PREGNANCY, URINE: PREG TEST UR: NEGATIVE
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
## 2018-02-15 NOTE — Discharge Instructions (Signed)
X-rays were all within normal limits.  You will be sore for several days.  Tylenol or ibuprofen for pain.  Elevate hand and apply ice pack.

## 2018-02-15 NOTE — ED Notes (Signed)
Pt transported to Xray. 

## 2018-02-16 ENCOUNTER — Encounter (HOSPITAL_COMMUNITY): Payer: Self-pay | Admitting: Emergency Medicine

## 2018-02-16 ENCOUNTER — Emergency Department (HOSPITAL_COMMUNITY)
Admission: EM | Admit: 2018-02-16 | Discharge: 2018-02-16 | Disposition: A | Discharge status: Home / Self Care | Payer: BLUE CROSS/BLUE SHIELD | Attending: Emergency Medicine | Admitting: Emergency Medicine

## 2018-02-16 DIAGNOSIS — Z041 Encounter for examination and observation following transport accident: Secondary | ICD-10-CM | POA: Diagnosis not present

## 2018-02-16 DIAGNOSIS — M79602 Pain in left arm: Secondary | ICD-10-CM | POA: Insufficient documentation

## 2018-02-16 DIAGNOSIS — Z79899 Other long term (current) drug therapy: Secondary | ICD-10-CM | POA: Insufficient documentation

## 2018-02-16 MED ORDER — CYCLOBENZAPRINE HCL 5 MG PO TABS: 5.0000 mg | ORAL_TABLET | Freq: Every evening | ORAL | 0 refills | Status: DC | PRN
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
## 2018-02-16 NOTE — Discharge Instructions (Signed)
Continue taking aleve 2 times a day with meals.  Do not take other anti-inflammatories at the same time open (Advil, Motrin, ibuprofen, Aleve). You may supplement with Tylenol if you need further pain control. Use Flexeril as needed for muscle stiffness or soreness.  Have caution, this may make you tired or groggy.  Do not drive or operate heavy machinery while taking this medicine. Use ice packs or heating pads if this helps control your pain. You likely have continued muscle stiffness and soreness over the next couple days.  Follow-up with orthopedics in 1 week if your symptoms are not improving. Return to the emergency room if you develop persistent numbness, your hand turns white/gray, you are unable to move your arm/fingers, or any new or worsening symptoms.

## 2018-02-16 NOTE — ED Notes (Signed)
Pt understood dc material. NAD noted. Scripts given at dc 
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
## 2018-02-26 ENCOUNTER — Telehealth: Payer: Self-pay | Admitting: Orthopedic Surgery
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
## 2018-03-14 DIAGNOSIS — F439 Reaction to severe stress, unspecified: Secondary | ICD-10-CM | POA: Diagnosis not present

## 2018-03-15 ENCOUNTER — Encounter (HOSPITAL_COMMUNITY): Payer: Self-pay | Admitting: Emergency Medicine

## 2018-03-15 ENCOUNTER — Emergency Department (HOSPITAL_COMMUNITY)
Admission: EM | Admit: 2018-03-15 | Discharge: 2018-03-15 | Disposition: A | Discharge status: Home / Self Care | Payer: BLUE CROSS/BLUE SHIELD | Attending: Emergency Medicine | Admitting: Emergency Medicine

## 2018-03-15 DIAGNOSIS — Z79899 Other long term (current) drug therapy: Secondary | ICD-10-CM | POA: Diagnosis not present

## 2018-03-15 DIAGNOSIS — M79602 Pain in left arm: Secondary | ICD-10-CM | POA: Diagnosis not present

## 2018-03-15 DIAGNOSIS — M25422 Effusion, left elbow: Secondary | ICD-10-CM | POA: Diagnosis not present

## 2018-03-15 DIAGNOSIS — M25522 Pain in left elbow: Secondary | ICD-10-CM

## 2018-03-15 MED ORDER — MELOXICAM 7.5 MG PO TABS: 15.0000 mg | ORAL_TABLET | Freq: Every day | ORAL | 0 refills | Status: DC
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
## 2018-03-15 NOTE — Discharge Instructions (Signed)
Wear the sling for 2 days Mobic as prescribed ER for worsening symptoms.
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
## 2018-03-27 ENCOUNTER — Telehealth: Payer: Self-pay | Admitting: Orthopedic Surgery
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
## 2018-03-28 DIAGNOSIS — F419 Anxiety disorder, unspecified: Secondary | ICD-10-CM | POA: Diagnosis not present

## 2018-04-17 DIAGNOSIS — F419 Anxiety disorder, unspecified: Secondary | ICD-10-CM | POA: Diagnosis not present

## 2018-04-23 DIAGNOSIS — F329 Major depressive disorder, single episode, unspecified: Secondary | ICD-10-CM | POA: Diagnosis not present

## 2018-05-13 ENCOUNTER — Ambulatory Visit: Payer: BLUE CROSS/BLUE SHIELD | Admitting: Advanced Practice Midwife

## 2018-05-27 ENCOUNTER — Ambulatory Visit: Payer: BLUE CROSS/BLUE SHIELD | Admitting: Advanced Practice Midwife

## 2018-06-04 ENCOUNTER — Ambulatory Visit: Payer: BLUE CROSS/BLUE SHIELD | Admitting: Advanced Practice Midwife

## 2018-06-12 ENCOUNTER — Ambulatory Visit (INDEPENDENT_AMBULATORY_CARE_PROVIDER_SITE_OTHER): Payer: BLUE CROSS/BLUE SHIELD | Admitting: Adult Health

## 2018-06-12 ENCOUNTER — Encounter: Payer: Self-pay | Admitting: Adult Health

## 2018-06-12 VITALS — BP 96/55 | HR 71 | Ht 62.0 in | Wt 129.0 lb

## 2018-06-12 DIAGNOSIS — N898 Other specified noninflammatory disorders of vagina: Secondary | ICD-10-CM

## 2018-06-12 DIAGNOSIS — Z113 Encounter for screening for infections with a predominantly sexual mode of transmission: Secondary | ICD-10-CM | POA: Diagnosis not present

## 2018-06-12 DIAGNOSIS — N760 Acute vaginitis: Secondary | ICD-10-CM

## 2018-06-12 DIAGNOSIS — B9689 Other specified bacterial agents as the cause of diseases classified elsewhere: Secondary | ICD-10-CM | POA: Diagnosis not present

## 2018-06-12 DIAGNOSIS — Z3202 Encounter for pregnancy test, result negative: Secondary | ICD-10-CM | POA: Diagnosis not present

## 2018-06-12 DIAGNOSIS — B379 Candidiasis, unspecified: Secondary | ICD-10-CM | POA: Diagnosis not present

## 2018-06-12 DIAGNOSIS — Z30013 Encounter for initial prescription of injectable contraceptive: Secondary | ICD-10-CM

## 2018-06-12 LAB — POCT WET PREP (WET MOUNT)
CLUE CELLS WET PREP WHIFF POC: POSITIVE
WBC WET PREP: POSITIVE

## 2018-06-12 LAB — POCT URINE PREGNANCY: PREG TEST UR: NEGATIVE

## 2018-06-12 MED ORDER — METRONIDAZOLE 500 MG PO TABS: 500.0000 mg | ORAL_TABLET | Freq: Two times a day (BID) | ORAL | 0 refills | Status: DC

## 2018-06-12 MED ORDER — MEDROXYPROGESTERONE ACETATE 150 MG/ML IM SUSP: 150.0000 mg | INTRAMUSCULAR | 3 refills | Status: DC

## 2018-06-12 MED ORDER — FLUCONAZOLE 150 MG PO TABS: ORAL_TABLET | ORAL | 1 refills | Status: DC

## 2018-06-12 NOTE — Progress Notes (Signed)
  Subjective:     Patient ID: Morgan [name], female   DOB: 1996/09/06, 22 y.o.   MRN: [id number]  HPI Morgan [name] is a 22 year old white female in complaining of vaginal discharge with odor after using Dove soap and she wants to get back on Depo.   Review of Systems +vaginal discharge with odor after using dove soap Not currently having sex  Reviewed past medical,surgical, social and family history. Reviewed medications and allergies.     Objective:   Physical Exam BP (!) 96/55 (BP Location: Left Arm, Patient Position: Sitting, Cuff Size: Small)   Pulse 71   Ht 5\' 2"  (1.575 m)   Wt 129 lb (58.5 kg)   LMP 05/17/2018   BMI 23.59 kg/m UPT negative. Skin warm and dry.Pelvic: external genitalia is normal in appearance no lesions, vagina: white discharge with odor,tender to touch,urethra has no lesions or masses noted, cervix:smooth, uterus: normal size, shape and contour, non tender, no masses felt, adnexa: no masses or tenderness noted. Bladder is non tender and no masses felt. Wet prep: + for clue cells and +WBCs and yeast. GC/CHL obtained.    She declines HIV testing.   Assessment:     1. Vaginal discharge   2. BV (bacterial vaginosis)   3. Yeast infection   4. Screening examination for STD (sexually transmitted disease)   5. Encounter for initial prescription of injectable contraceptive   6. Pregnancy examination or test, negative result       Plan:    GC/CHL sent  Use dial soap on bottom  Meds ordered this encounter  Medications  . medroxyPROGESTERone (DEPO-PROVERA) 150 MG/ML injection    Sig: Inject 1 mL (150 mg total) into the muscle every 3 (three) months.    Dispense:  1 mL    Refill:  3    Order Specific Question:   Supervising Provider    Answer:   EURE, LUTHER H [[id number]]  . metroNIDAZOLE (FLAGYL) 500 MG tablet    Sig: Take 1 tablet (500 mg total) by mouth 2 (two) times daily.    Dispense:  14 tablet    Refill:  0    Order Specific Question:   Supervising Provider     Answer:   Despina HiddenEURE, LUTHER H [[id number]]  . fluconazole (DIFLUCAN) 150 MG tablet    Sig: Take 1 now and 1 in 3 days    Dispense:  2 tablet    Refill:  1    Order Specific Question:   Supervising Provider    Answer:   Duane LopeEURE, LUTHER H [[id number]]  Return in 1 week for depo when on period

## 2018-06-15 LAB — GC/CHLAMYDIA PROBE AMP
Chlamydia trachomatis, NAA: NEGATIVE
NEISSERIA GONORRHOEAE BY PCR: NEGATIVE

## 2018-06-19 ENCOUNTER — Ambulatory Visit: Payer: BLUE CROSS/BLUE SHIELD

## 2018-07-17 DIAGNOSIS — F419 Anxiety disorder, unspecified: Secondary | ICD-10-CM | POA: Diagnosis not present

## 2018-07-22 ENCOUNTER — Ambulatory Visit: Payer: BLUE CROSS/BLUE SHIELD | Admitting: Orthopaedic Surgery

## 2018-08-03 ENCOUNTER — Emergency Department (HOSPITAL_COMMUNITY)
Admission: EM | Admit: 2018-08-03 | Discharge: 2018-08-04 | Disposition: A | Discharge status: Home / Self Care | Payer: BLUE CROSS/BLUE SHIELD | Attending: Emergency Medicine | Admitting: Emergency Medicine

## 2018-08-03 ENCOUNTER — Encounter (HOSPITAL_COMMUNITY): Payer: Self-pay | Admitting: Emergency Medicine

## 2018-08-03 ENCOUNTER — Other Ambulatory Visit: Payer: Self-pay

## 2018-08-03 DIAGNOSIS — Z79899 Other long term (current) drug therapy: Secondary | ICD-10-CM | POA: Insufficient documentation

## 2018-08-03 DIAGNOSIS — R55 Syncope and collapse: Secondary | ICD-10-CM | POA: Insufficient documentation

## 2018-08-03 NOTE — ED Triage Notes (Signed)
Pt states she stood up from a seated position and passed out

## 2018-08-03 NOTE — ED Provider Notes (Signed)
[facility] EMERGENCY DEPARTMENT Provider Note   CSN: [id number] Arrival date & time: 08/03/18  2232     History   Chief Complaint Chief Complaint  Patient presents with  . Near Syncope    HPI Morgan [name] is a 22 y.o. female.  Patient is a 22 year old female with no significant past medical history.  She presents today for evaluation of syncope.  She reports moving boxes and cleaning throughout most of the day.  This evening she was sitting down, then stood up too fast, felt dizzy, then passed out for a short period of time.  She states when she woke up she had ringing in her ears and was somewhat disoriented.  She denies any seizure-like activity.  She denies any biting of the tongue or cheek.  She denies any bowel or bladder incontinence.  She now feels fine and has no further complaints.  The history is provided by the patient.  Near Syncope  This is a new problem. The current episode started 1 to 2 hours ago. The problem has been resolved. Pertinent negatives include no chest pain, no headaches and no shortness of breath. Nothing aggravates the symptoms. Nothing relieves the symptoms. She has tried nothing for the symptoms.    Past Medical History:  Diagnosis Date  . Contraceptive management 04/25/2016    Patient Active Problem List   Diagnosis Date Noted  . Burning with urination 04/09/2017  . Vaginal discharge 04/09/2017  . Urinary tract infection without hematuria 04/09/2017  . BV (bacterial vaginosis) 04/09/2017  . Pyelonephritis, acute 11/07/2016  . Screening for STD (sexually transmitted disease) 11/07/2016  . Contraceptive management 04/25/2016  . GERD (gastroesophageal reflux disease) 11/18/2013  . Radial styloid fracture 02/24/2013  . Scaphoid fracture of wrist 02/24/2013  . CLOSED FRACTURE OF CUBOID BONE 11/07/2009    Past Surgical History:  Procedure Laterality Date  . ROOT CANAL  03/11/2017     OB History    Gravida  0   Para  0   Term  0   Preterm  0   AB  0   Living  0     SAB  0   TAB  0   Ectopic  0   Multiple  0   Live Births               Home Medications    Prior to Admission medications   Medication Sig Start Date End Date Taking? Authorizing Provider  fluconazole (DIFLUCAN) 150 MG tablet Take 1 now and 1 in 3 days 06/12/18   Adline PotterGriffin, Jennifer A, NP  medroxyPROGESTERone (DEPO-PROVERA) 150 MG/ML injection Inject 1 mL (150 mg total) into the muscle every 3 (three) months. 06/12/18   Adline PotterGriffin, Jennifer A, NP  meloxicam (MOBIC) 7.5 MG tablet Take 2 tablets (15 mg total) by mouth daily. Patient not taking: Reported on 06/12/2018 03/15/18   Eber HongMiller, Brian, MD  metroNIDAZOLE (FLAGYL) 500 MG tablet Take 1 tablet (500 mg total) by mouth 2 (two) times daily. 06/12/18   Adline PotterGriffin, Jennifer A, NP    Family History Family History  Problem Relation Age of Onset  . Diabetes Paternal Aunt   . Cancer Paternal Aunt   . Alzheimer's disease Paternal Aunt   . Diabetes Maternal Grandmother   . Cancer Paternal Grandmother   . Stroke Paternal Grandmother   . Heart attack Paternal Grandmother   . Diabetes Paternal Grandmother   . Diabetes Unknown     Social History Social History  Tobacco Use  . Smoking status: Never Smoker  . Smokeless tobacco: Never Used  Substance Use Topics  . Alcohol use: No  . Drug use: No     Allergies   Patient has no known allergies.   Review of Systems Review of Systems  Respiratory: Negative for shortness of breath.   Cardiovascular: Positive for near-syncope. Negative for chest pain.  Neurological: Negative for headaches.  All other systems reviewed and are negative.    Physical Exam Updated Vital Signs BP (!) 116/56 (BP Location: Right Arm)   Pulse (!) 101   Temp 98.4 F (36.9 C) (Oral)   Resp 18   Ht 5\' 2"  (1.575 m)   Wt 56.7 kg   LMP 07/08/2018 (Approximate)   SpO2 100%   BMI 22.86 kg/m   Physical Exam  Constitutional: She is oriented to person, place, and  time. She appears well-developed and well-nourished. No distress.  HENT:  Head: Normocephalic and atraumatic.  Eyes: Pupils are equal, round, and reactive to light. EOM are normal.  Neck: Normal range of motion. Neck supple.  Cardiovascular: Normal rate and regular rhythm. Exam reveals no gallop and no friction rub.  No murmur heard. Pulmonary/Chest: Effort normal and breath sounds normal. No respiratory distress. She has no wheezes.  Abdominal: Soft. Bowel sounds are normal. She exhibits no distension. There is no tenderness.  Musculoskeletal: Normal range of motion.  Neurological: She is alert and oriented to person, place, and time. No cranial nerve deficit. She exhibits normal muscle tone. Coordination normal.  Skin: Skin is warm and dry. She is not diaphoretic.  Nursing note and vitals reviewed.    ED Treatments / Results  Labs (all labs ordered are listed, but only abnormal results are displayed) Labs Reviewed  BASIC METABOLIC PANEL  CBC WITH DIFFERENTIAL/PLATELET  URINALYSIS, ROUTINE W REFLEX MICROSCOPIC  PREGNANCY, URINE    EKG None  Radiology No results found.  Procedures Procedures (including critical care time)  Medications Ordered in ED Medications - No data to display   Initial Impression / Assessment and Plan / ED Course  I have reviewed the triage vital signs and the nursing notes.  Pertinent labs & imaging results that were available during my care of the patient were reviewed by me and considered in my medical decision making (see chart for details).  Patient is an otherwise healthy 22 year old female presenting after a syncopal episode.  This occurred when she stood up quickly after sitting.  I highly suspect a blood pressure drop as the cause.  Her laboratory studies are unremarkable and EKG is normal.  She has had no further issues while in the ER.  At this point, I feel as though she is appropriate for discharge and outpatient follow-up as  needed.  Final Clinical Impressions(s) / ED Diagnoses   Final diagnoses:  None    ED Discharge Orders    None       Geoffery Lyons, MD 08/04/18 [id number]

## 2018-08-04 LAB — BASIC METABOLIC PANEL
Anion gap: 7 (ref 5–15)
BUN: 11 mg/dL (ref 6–20)
CALCIUM: 8.9 mg/dL (ref 8.9–10.3)
CO2: 27 mmol/L (ref 22–32)
Chloride: 104 mmol/L (ref 98–111)
Creatinine, Ser: 1.04 mg/dL — ABNORMAL HIGH (ref 0.44–1.00)
Glucose, Bld: 82 mg/dL (ref 70–99)
Potassium: 4 mmol/L (ref 3.5–5.1)
Sodium: 138 mmol/L (ref 135–145)

## 2018-08-04 LAB — URINALYSIS, ROUTINE W REFLEX MICROSCOPIC
Bilirubin Urine: NEGATIVE
Glucose, UA: NEGATIVE mg/dL
Hgb urine dipstick: NEGATIVE
KETONES UR: NEGATIVE mg/dL
LEUKOCYTES UA: NEGATIVE
Nitrite: NEGATIVE
PH: 7 (ref 5.0–8.0)
Protein, ur: NEGATIVE mg/dL
SPECIFIC GRAVITY, URINE: 1.002 — AB (ref 1.005–1.030)

## 2018-08-04 LAB — CBC WITH DIFFERENTIAL/PLATELET
BASOS ABS: 0 10*3/uL (ref 0.0–0.1)
BASOS PCT: 0 %
Eosinophils Absolute: 0 10*3/uL (ref 0.0–0.7)
Eosinophils Relative: 0 %
HEMATOCRIT: 34.8 % — AB (ref 36.0–46.0)
HEMOGLOBIN: 12 g/dL (ref 12.0–15.0)
Lymphocytes Relative: 30 %
Lymphs Abs: 1.6 10*3/uL (ref 0.7–4.0)
MCH: 31.3 pg (ref 26.0–34.0)
MCHC: 34.5 g/dL (ref 30.0–36.0)
MCV: 90.6 fL (ref 78.0–100.0)
Monocytes Absolute: 0.5 10*3/uL (ref 0.1–1.0)
Monocytes Relative: 10 %
NEUTROS ABS: 3.2 10*3/uL (ref 1.7–7.7)
NEUTROS PCT: 60 %
Platelets: 244 10*3/uL (ref 150–400)
RBC: 3.84 MIL/uL — ABNORMAL LOW (ref 3.87–5.11)
RDW: 12.6 % (ref 11.5–15.5)
WBC: 5.4 10*3/uL (ref 4.0–10.5)

## 2018-08-04 LAB — PREGNANCY, URINE: PREG TEST UR: NEGATIVE

## 2018-08-04 MED ORDER — MORPHINE SULFATE (PF) 4 MG/ML IV SOLN: 4.0000 mg | Freq: Once | INTRAVENOUS | Status: DC

## 2018-08-04 NOTE — Discharge Instructions (Addendum)
Drink plenty of fluids and get plenty of rest.  Return to the emergency department if you develop any new and concerning symptoms.

## 2018-11-05 ENCOUNTER — Other Ambulatory Visit: Payer: BLUE CROSS/BLUE SHIELD | Admitting: Obstetrics and Gynecology

## 2018-12-15 ENCOUNTER — Ambulatory Visit: Payer: BLUE CROSS/BLUE SHIELD | Admitting: Obstetrics & Gynecology

## 2018-12-18 ENCOUNTER — Ambulatory Visit: Payer: BLUE CROSS/BLUE SHIELD | Admitting: Women's Health

## 2018-12-18 ENCOUNTER — Encounter: Payer: Self-pay | Admitting: Women's Health

## 2018-12-18 VITALS — BP 119/74 | HR 116 | Ht 62.0 in | Wt 129.0 lb

## 2018-12-18 DIAGNOSIS — Z1389 Encounter for screening for other disorder: Secondary | ICD-10-CM

## 2018-12-18 DIAGNOSIS — H53143 Visual discomfort, bilateral: Secondary | ICD-10-CM | POA: Diagnosis not present

## 2018-12-18 DIAGNOSIS — J111 Influenza due to unidentified influenza virus with other respiratory manifestations: Secondary | ICD-10-CM | POA: Diagnosis not present

## 2018-12-18 DIAGNOSIS — H5713 Ocular pain, bilateral: Secondary | ICD-10-CM | POA: Diagnosis not present

## 2018-12-18 DIAGNOSIS — R350 Frequency of micturition: Secondary | ICD-10-CM

## 2018-12-18 DIAGNOSIS — J209 Acute bronchitis, unspecified: Secondary | ICD-10-CM | POA: Diagnosis not present

## 2018-12-18 DIAGNOSIS — Z331 Pregnant state, incidental: Secondary | ICD-10-CM

## 2018-12-18 LAB — POCT URINALYSIS DIPSTICK OB
Blood, UA: NEGATIVE
GLUCOSE, UA: NEGATIVE
Ketones, UA: NEGATIVE
LEUKOCYTES UA: NEGATIVE
Nitrite, UA: NEGATIVE
POC,PROTEIN,UA: NEGATIVE

## 2018-12-18 NOTE — Progress Notes (Signed)
   GYN VISIT [name] MRN [id number]  Date of birth: 24-Mar-1996 Chief Complaint:   Urinary Frequency (x 1 week/ took azo)  History of Present Illness:   Morgan [name] is a 22 y.o. G0P0000 African American female being seen today for report of urinary frequency x 1 week, only dribbles. No dysuria. Took AZO last night, feels better.      Patient's last menstrual period was 11/13/2018. Review of Systems:   Pertinent items are noted in HPI Denies fever/chills, dizziness, headaches, visual disturbances, fatigue, shortness of breath, chest pain, abdominal pain, vomiting, abnormal vaginal discharge/itching/odor/irritation, problems with periods, bowel movements, urination, or intercourse unless otherwise stated above.  Pertinent History Reviewed:  Reviewed past medical,surgical, social, obstetrical and family history.  Reviewed problem list, medications and allergies. Physical Assessment:   Vitals:   12/18/18 1349  BP: 119/74  Pulse: (!) 116  Weight: 129 lb (58.5 kg)  Height: 5\' 2"  (1.575 m)  Body mass index is 23.59 kg/m.       Physical Examination:   General appearance: alert, well appearing, and in no distress  Mental status: alert, oriented to person, place, and time  Skin: warm & dry   Cardiovascular: normal heart rate noted  Respiratory: normal respiratory effort, no distress  Abdomen: soft, non-tender   Pelvic: examination not indicated  Extremities: no edema   Results for orders placed or performed in visit on 12/18/18 (from the past 24 hour(s))  POC Urinalysis Dipstick OB   Collection Time: 12/18/18  2:26 PM  Result Value Ref Range   Color, UA     Clarity, UA     Glucose, UA Negative Negative   Bilirubin, UA     Ketones, UA neg    Spec Grav, UA     Blood, UA neg    pH, UA     POC,PROTEIN,UA Negative Negative, Trace, Small (1+), Moderate (2+), Large (3+), 4+   Urobilinogen, UA     Nitrite, UA neg    Leukocytes, UA Negative Negative   Appearance     Odor      Assessment & Plan:  1) Urinary frequency> improved today, will send cx and gc/ct  Meds: No orders of the defined types were placed in this encounter.   Orders Placed This Encounter  Procedures  . Urine Culture  . GC/Chlamydia Probe Amp  . POC Urinalysis Dipstick OB    Return in about 1 month (around 01/18/2019) for Pap & physical.  Cheral MarkerKimberly R Javonni Macke CNM, [facility]NP-BC 12/18/2018 2:27 PM

## 2018-12-20 LAB — URINE CULTURE

## 2018-12-22 ENCOUNTER — Other Ambulatory Visit: Payer: Self-pay | Admitting: Women's Health

## 2018-12-22 MED ORDER — SULFAMETHOXAZOLE-TRIMETHOPRIM 800-160 MG PO TABS
1.0000 | ORAL_TABLET | Freq: Two times a day (BID) | ORAL | 0 refills | Status: DC
Start: 2018-12-22 — End: 2019-02-04

## 2018-12-23 ENCOUNTER — Telehealth: Payer: Self-pay | Admitting: *Deleted

## 2018-12-23 NOTE — Telephone Encounter (Signed)
Numbers not working

## 2018-12-23 NOTE — Telephone Encounter (Signed)
Phone numbers not working

## 2018-12-24 LAB — GC/CHLAMYDIA PROBE AMP
Chlamydia trachomatis, NAA: UNDETERMINED — AB
Neisseria gonorrhoeae by PCR: NEGATIVE

## 2018-12-29 ENCOUNTER — Encounter: Payer: Self-pay | Admitting: Women's Health

## 2019-01-01 ENCOUNTER — Telehealth: Payer: Self-pay | Admitting: Adult Health

## 2019-01-01 NOTE — Telephone Encounter (Signed)
Patient called, stated she received a letter stating that she needs to come in and leave a urine sample.  She wants to know when she does, can she be tested for everything.  [phone]

## 2019-01-08 ENCOUNTER — Other Ambulatory Visit: Payer: BLUE CROSS/BLUE SHIELD

## 2019-01-08 DIAGNOSIS — Z113 Encounter for screening for infections with a predominantly sexual mode of transmission: Secondary | ICD-10-CM | POA: Diagnosis not present

## 2019-01-10 LAB — GC/CHLAMYDIA PROBE AMP
CHLAMYDIA, DNA PROBE: NEGATIVE
NEISSERIA GONORRHOEAE BY PCR: NEGATIVE

## 2019-01-19 DIAGNOSIS — R350 Frequency of micturition: Secondary | ICD-10-CM | POA: Diagnosis not present

## 2019-01-19 DIAGNOSIS — R1032 Left lower quadrant pain: Secondary | ICD-10-CM | POA: Diagnosis not present

## 2019-01-19 DIAGNOSIS — N3000 Acute cystitis without hematuria: Secondary | ICD-10-CM | POA: Diagnosis not present

## 2019-01-19 DIAGNOSIS — K5900 Constipation, unspecified: Secondary | ICD-10-CM | POA: Diagnosis not present

## 2019-01-22 ENCOUNTER — Other Ambulatory Visit: Payer: BLUE CROSS/BLUE SHIELD | Admitting: Adult Health

## 2019-01-22 ENCOUNTER — Telehealth: Payer: Self-pay | Admitting: *Deleted

## 2019-01-22 NOTE — Telephone Encounter (Signed)
Pt informed of negative gc.ch probe.

## 2019-01-27 ENCOUNTER — Encounter (HOSPITAL_COMMUNITY): Payer: Self-pay

## 2019-01-27 ENCOUNTER — Other Ambulatory Visit: Payer: Self-pay

## 2019-01-27 ENCOUNTER — Emergency Department (HOSPITAL_COMMUNITY)
Admission: EM | Admit: 2019-01-27 | Discharge: 2019-01-27 | Disposition: A | Discharge status: Home / Self Care | Payer: BLUE CROSS/BLUE SHIELD | Attending: Emergency Medicine | Admitting: Emergency Medicine

## 2019-01-27 DIAGNOSIS — Z5321 Procedure and treatment not carried out due to patient leaving prior to being seen by health care provider: Secondary | ICD-10-CM | POA: Insufficient documentation

## 2019-01-27 DIAGNOSIS — R112 Nausea with vomiting, unspecified: Secondary | ICD-10-CM | POA: Diagnosis not present

## 2019-01-27 LAB — COMPREHENSIVE METABOLIC PANEL
ALBUMIN: 4.1 g/dL (ref 3.5–5.0)
ALT: 15 U/L (ref 0–44)
ANION GAP: 10 (ref 5–15)
AST: 20 U/L (ref 15–41)
Alkaline Phosphatase: 59 U/L (ref 38–126)
BILIRUBIN TOTAL: 1.1 mg/dL (ref 0.3–1.2)
BUN: 15 mg/dL (ref 6–20)
CO2: 25 mmol/L (ref 22–32)
Calcium: 9.2 mg/dL (ref 8.9–10.3)
Chloride: 102 mmol/L (ref 98–111)
Creatinine, Ser: 0.95 mg/dL (ref 0.44–1.00)
GFR calc non Af Amer: >60 mL/min (ref >60)
GLUCOSE: 94 mg/dL (ref 70–99)
POTASSIUM: 4.4 mmol/L (ref 3.5–5.1)
Sodium: 137 mmol/L (ref 135–145)
TOTAL PROTEIN: 7.7 g/dL (ref 6.5–8.1)

## 2019-01-27 LAB — URINALYSIS, ROUTINE W REFLEX MICROSCOPIC
Bacteria, UA: NONE SEEN
Bilirubin Urine: NEGATIVE
Glucose, UA: NEGATIVE mg/dL
Hgb urine dipstick: NEGATIVE
KETONES UR: 5 mg/dL — AB
Leukocytes, UA: NEGATIVE
Nitrite: NEGATIVE
Protein, ur: 30 mg/dL — AB
Specific Gravity, Urine: 1.034 — ABNORMAL HIGH (ref 1.005–1.030)
pH: 5 (ref 5.0–8.0)

## 2019-01-27 LAB — CBC
HCT: 38.9 % (ref 36.0–46.0)
Hemoglobin: 12.7 g/dL (ref 12.0–15.0)
MCH: 30 pg (ref 26.0–34.0)
MCHC: 32.6 g/dL (ref 30.0–36.0)
MCV: 91.7 fL (ref 80.0–100.0)
Platelets: 238 10*3/uL (ref 150–400)
RBC: 4.24 MIL/uL (ref 3.87–5.11)
RDW: 12.3 % (ref 11.5–15.5)
WBC: 4.6 10*3/uL (ref 4.0–10.5)
nRBC: 0 % (ref 0.0–0.2)

## 2019-01-27 LAB — PREGNANCY, URINE: Preg Test, Ur: NEGATIVE

## 2019-01-27 LAB — LIPASE, BLOOD: Lipase: 21 U/L (ref 11–51)

## 2019-01-27 NOTE — ED Triage Notes (Signed)
Pt reports one episode of diarrhea this morning. Reports multiple episodes of vomiting, last time at 3 pm. Pt reports having chills. Symptoms started yesterday.

## 2019-01-28 DIAGNOSIS — J111 Influenza due to unidentified influenza virus with other respiratory manifestations: Secondary | ICD-10-CM | POA: Diagnosis not present

## 2019-01-28 DIAGNOSIS — K529 Noninfective gastroenteritis and colitis, unspecified: Secondary | ICD-10-CM | POA: Diagnosis not present

## 2019-01-28 DIAGNOSIS — J329 Chronic sinusitis, unspecified: Secondary | ICD-10-CM | POA: Diagnosis not present

## 2019-01-30 ENCOUNTER — Other Ambulatory Visit: Payer: BLUE CROSS/BLUE SHIELD | Admitting: Adult Health

## 2019-02-04 ENCOUNTER — Encounter: Payer: Self-pay | Admitting: Adult Health

## 2019-02-04 ENCOUNTER — Other Ambulatory Visit (HOSPITAL_COMMUNITY)
Admission: RE | Admit: 2019-02-04 | Discharge: 2019-02-04 | Disposition: A | Discharge status: Home / Self Care | Payer: BLUE CROSS/BLUE SHIELD | Source: Ambulatory Visit | Attending: Adult Health | Admitting: Adult Health

## 2019-02-04 ENCOUNTER — Ambulatory Visit (INDEPENDENT_AMBULATORY_CARE_PROVIDER_SITE_OTHER): Payer: BLUE CROSS/BLUE SHIELD | Admitting: Adult Health

## 2019-02-04 VITALS — BP 111/70 | HR 95 | Ht 62.0 in | Wt 128.0 lb

## 2019-02-04 DIAGNOSIS — Z30013 Encounter for initial prescription of injectable contraceptive: Secondary | ICD-10-CM

## 2019-02-04 DIAGNOSIS — Z01419 Encounter for gynecological examination (general) (routine) without abnormal findings: Secondary | ICD-10-CM

## 2019-02-04 MED ORDER — MEDROXYPROGESTERONE ACETATE 150 MG/ML IM SUSP: 150.0000 mg | INTRAMUSCULAR | 4 refills | Status: DC
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
## 2019-02-06 LAB — CYTOLOGY - PAP
Chlamydia: NEGATIVE
Diagnosis: NEGATIVE
Neisseria Gonorrhea: NEGATIVE

## 2019-02-12 ENCOUNTER — Ambulatory Visit: Payer: BLUE CROSS/BLUE SHIELD

## 2019-02-13 ENCOUNTER — Ambulatory Visit: Payer: BLUE CROSS/BLUE SHIELD

## 2019-02-13 ENCOUNTER — Encounter: Payer: Self-pay | Admitting: Obstetrics & Gynecology

## 2019-02-16 ENCOUNTER — Ambulatory Visit (INDEPENDENT_AMBULATORY_CARE_PROVIDER_SITE_OTHER): Payer: BLUE CROSS/BLUE SHIELD | Admitting: *Deleted

## 2019-02-16 ENCOUNTER — Encounter: Payer: Self-pay | Admitting: Obstetrics & Gynecology

## 2019-02-16 ENCOUNTER — Encounter: Payer: Self-pay | Admitting: *Deleted

## 2019-02-16 DIAGNOSIS — Z308 Encounter for other contraceptive management: Secondary | ICD-10-CM

## 2019-02-16 DIAGNOSIS — Z3042 Encounter for surveillance of injectable contraceptive: Secondary | ICD-10-CM | POA: Diagnosis not present

## 2019-02-16 DIAGNOSIS — Z3202 Encounter for pregnancy test, result negative: Secondary | ICD-10-CM

## 2019-02-16 LAB — POCT URINE PREGNANCY: Preg Test, Ur: NEGATIVE

## 2019-02-16 MED ORDER — MEDROXYPROGESTERONE ACETATE 150 MG/ML IM SUSP
150.0000 mg | Freq: Once | INTRAMUSCULAR | Status: AC
  Administered 2019-02-16: 150 mg via INTRAMUSCULAR

## 2019-02-16 NOTE — Progress Notes (Signed)
Pt here for Depo. Pt received shot in left hip. Pt tolerated shot well. Return in 12 weeks for next shot. JSY

## 2019-02-22 ENCOUNTER — Ambulatory Visit (HOSPITAL_COMMUNITY)
Admission: EM | Admit: 2019-02-22 | Discharge: 2019-02-22 | Disposition: A | Discharge status: Home / Self Care | Payer: BLUE CROSS/BLUE SHIELD | Attending: Family Medicine | Admitting: Family Medicine

## 2019-02-22 ENCOUNTER — Encounter (HOSPITAL_COMMUNITY): Payer: Self-pay | Admitting: Emergency Medicine

## 2019-02-22 DIAGNOSIS — R6889 Other general symptoms and signs: Secondary | ICD-10-CM

## 2019-02-22 MED ORDER — ONDANSETRON HCL 4 MG PO TABS: 4.0000 mg | ORAL_TABLET | Freq: Four times a day (QID) | ORAL | 0 refills | Status: DC

## 2019-02-22 NOTE — ED Provider Notes (Signed)
[facility]    CSN: [id number] Arrival date & time: 02/22/19  1746     History   Chief Complaint Chief Complaint  Patient presents with  . Emesis  . Diarrhea    HPI Morgan [name] is a 23 y.o. female.   HPI  Patient states she has been sick since Thursday, 02/19/2019.  She started off with nausea vomiting and diarrhea.  She then developed some head congestion and chest congestion.  She has had a lot of body aches and fatigue.  She has not taken her temperature but did have sweats and chills.  She was exposed to a coworker with flu.  She states she still feels quite tired, and is having decreased appetite, intermittent loose bowels.  Past Medical History:  Diagnosis Date  . Contraceptive management 04/25/2016    Patient Active Problem List   Diagnosis Date Noted  . Encounter for initial prescription of injectable contraceptive 02/04/2019  . Encounter for gynecological examination with Papanicolaou smear of cervix 02/04/2019  . Urinary tract infection without hematuria 04/09/2017  . BV (bacterial vaginosis) 04/09/2017  . Pyelonephritis, acute 11/07/2016  . GERD (gastroesophageal reflux disease) 11/18/2013  . Radial styloid fracture 02/24/2013  . Scaphoid fracture of wrist 02/24/2013  . CLOSED FRACTURE OF CUBOID BONE 11/07/2009    Past Surgical History:  Procedure Laterality Date  . ROOT CANAL  03/11/2017    OB History    Gravida  0   Para  0   Term  0   Preterm  0   AB  0   Living  0     SAB  0   TAB  0   Ectopic  0   Multiple  0   Live Births               Home Medications    Prior to Admission medications   Medication Sig Start Date End Date Taking? Authorizing Provider  medroxyPROGESTERone (DEPO-PROVERA) 150 MG/ML injection Inject 1 mL (150 mg total) into the muscle every 3 (three) months. 02/04/19   Adline Potter, NP  ondansetron (ZOFRAN) 4 MG tablet Take 1 tablet (4 mg total) by mouth every 6 (six) hours. 02/22/19   Eustace Moore, MD    Family History Family History  Problem Relation Age of Onset  . Diabetes Paternal Aunt   . Cancer Paternal Aunt   . Alzheimer's disease Paternal Aunt   . Diabetes Maternal Grandmother   . Cancer Paternal Grandmother   . Stroke Paternal Grandmother   . Heart attack Paternal Grandmother   . Diabetes Paternal Grandmother   . Diabetes Other     Social History Social History   Tobacco Use  . Smoking status: Never Smoker  . Smokeless tobacco: Never Used  Substance Use Topics  . Alcohol use: No  . Drug use: No     Allergies   Patient has no known allergies.   Review of Systems Review of Systems  Constitutional: Positive for fatigue.  HENT: Positive for rhinorrhea.   Respiratory: Positive for cough.   Gastrointestinal: Positive for diarrhea, nausea and vomiting.  Musculoskeletal: Positive for myalgias.     Physical Exam Triage Vital Signs ED Triage Vitals [02/22/19 1755]  Enc Vitals Group     BP 109/63     Pulse Rate 83     Resp 18     Temp 98.4 F (36.9 C)     Temp src      SpO2 99 %  Weight      Height      Head Circumference      Peak Flow      Pain Score 3     Pain Loc      Pain Edu?      Excl. in GC?    No data found.  Updated Vital Signs BP 109/63   Pulse 83   Temp 98.4 F (36.9 C)   Resp 18   LMP 02/10/2019   SpO2 99%      Physical Exam Constitutional:      General: She is not in acute distress.    Appearance: She is well-developed and normal weight. She is not ill-appearing.  HENT:     Head: Normocephalic and atraumatic.     Right Ear: Tympanic membrane, ear canal and external ear normal.     Left Ear: Tympanic membrane, ear canal and external ear normal.     Nose: Nose normal. No rhinorrhea.     Mouth/Throat:     Mouth: Mucous membranes are moist.     Pharynx: No posterior oropharyngeal erythema.  Eyes:     Conjunctiva/sclera: Conjunctivae normal.     Pupils: Pupils are equal, round, and reactive to light.    Neck:     Musculoskeletal: Normal range of motion.  Cardiovascular:     Rate and Rhythm: Normal rate and regular rhythm.     Heart sounds: Normal heart sounds.  Pulmonary:     Effort: Pulmonary effort is normal. No respiratory distress.     Breath sounds: Normal breath sounds.  Abdominal:     General: Bowel sounds are normal. There is no distension.     Palpations: Abdomen is soft.     Tenderness: There is abdominal tenderness.     Comments: Generalized mild tenderness  Musculoskeletal: Normal range of motion.  Skin:    General: Skin is warm and dry.  Neurological:     Mental Status: She is alert.      UC Treatments / Results  Labs (all labs ordered are listed, but only abnormal results are displayed) Labs Reviewed - No data to display  EKG None  Radiology No results found.  Procedures Procedures (including critical care time)  Medications Ordered in UC Medications - No data to display  Initial Impression / Assessment and Plan / UC Course  I have reviewed the triage vital signs and the nursing notes.  Pertinent labs & imaging results that were available during my care of the patient were reviewed by me and considered in my medical decision making (see chart for details).    Viral illness.  Possible influenza.  Patient is recovering.  Discussed symptomatic care and treatment Final Clinical Impressions(s) / UC Diagnoses   Final diagnoses:  Flu-like symptoms     Discharge Instructions     Rest Drink lots of fluids Take zofran as needed nausea and vomiting Bland diet as tolerated   ED Prescriptions    Medication Sig Dispense Auth. Provider   ondansetron (ZOFRAN) 4 MG tablet Take 1 tablet (4 mg total) by mouth every 6 (six) hours. 12 tablet Eustace Moore, MD     Controlled Substance Prescriptions [place] Controlled Substance Registry consulted? Not Applicable   Eustace Moore, MD 02/22/19 1815

## 2019-02-22 NOTE — ED Triage Notes (Signed)
Pt c/o vomiting and diarrhea since Thursday.

## 2019-02-22 NOTE — Discharge Instructions (Addendum)
Rest Drink lots of fluids Take zofran as needed nausea and vomiting Bland diet as tolerated

## 2019-02-27 DIAGNOSIS — K5900 Constipation, unspecified: Secondary | ICD-10-CM | POA: Diagnosis not present

## 2019-02-27 DIAGNOSIS — Z6824 Body mass index (BMI) 24.0-24.9, adult: Secondary | ICD-10-CM | POA: Diagnosis not present

## 2019-03-16 ENCOUNTER — Encounter: Payer: Self-pay | Admitting: Adult Health

## 2019-03-16 ENCOUNTER — Ambulatory Visit (INDEPENDENT_AMBULATORY_CARE_PROVIDER_SITE_OTHER): Payer: BLUE CROSS/BLUE SHIELD | Admitting: Adult Health

## 2019-03-16 ENCOUNTER — Other Ambulatory Visit: Payer: Self-pay

## 2019-03-16 VITALS — BP 103/68 | HR 79 | Temp 99.0°F | Ht 62.0 in | Wt 135.0 lb

## 2019-03-16 DIAGNOSIS — R350 Frequency of micturition: Secondary | ICD-10-CM

## 2019-03-16 DIAGNOSIS — J069 Acute upper respiratory infection, unspecified: Secondary | ICD-10-CM | POA: Diagnosis not present

## 2019-03-16 DIAGNOSIS — J329 Chronic sinusitis, unspecified: Secondary | ICD-10-CM | POA: Diagnosis not present

## 2019-03-16 DIAGNOSIS — R309 Painful micturition, unspecified: Secondary | ICD-10-CM

## 2019-03-16 DIAGNOSIS — J40 Bronchitis, not specified as acute or chronic: Secondary | ICD-10-CM | POA: Diagnosis not present

## 2019-03-16 LAB — POCT URINALYSIS DIPSTICK OB
Blood, UA: NEGATIVE
GLUCOSE, UA: NEGATIVE
Ketones, UA: NEGATIVE
Leukocytes, UA: NEGATIVE
Nitrite, UA: NEGATIVE
POC,PROTEIN,UA: NEGATIVE

## 2019-03-16 MED ORDER — SULFAMETHOXAZOLE-TRIMETHOPRIM 800-160 MG PO TABS: 1.0000 | ORAL_TABLET | Freq: Two times a day (BID) | ORAL | 0 refills | Status: DC

## 2019-03-16 MED ORDER — PHENAZOPYRIDINE HCL 200 MG PO TABS: 200.0000 mg | ORAL_TABLET | Freq: Three times a day (TID) | ORAL | 0 refills | Status: DC | PRN

## 2019-03-16 NOTE — Progress Notes (Signed)
Patient ID: Morgan [name], female   DOB: Nov 21, 1996, 23 y.o.   MRN: [id number] History of Present Illness: Morgan [name] is a 23 year old white female in complaining of pain with urination and frequency, was more intense last night and had pain low stomach. She is on depo.    Current Medications, Allergies, Past Medical History, Past Surgical History, Family History and Social History were reviewed in [employer] record.     Review of Systems: Pain with urination and frequency Low stomach pain last night  No new partners    Physical Exam:BP 103/68 (BP Location: Left Arm, Patient Position: Sitting, Cuff Size: Normal)   Pulse 79   Temp 99 F (37.2 C)   Ht 5\' 2"  (1.575 m)   Wt 135 lb (61.2 kg)   LMP 02/04/2019 (Approximate)   BMI 24.69 kg/m urine dipstick is negative. General:  Well developed, well nourished, no acute distress Skin:  Warm and dry Pelvic:  External genitalia is normal in appearance, no lesions.  The vagina is normal in appearance. Urethra has no lesions or masses. The cervix is smooth.  Uterus is felt to be normal size, shape, and contour.  No adnexal masses or tenderness noted.Bladder is  tender, no masses felt. No CVAT. Psych:  No mood changes, alert and cooperative,seems happy Examination chaperoned by Federico Flake CMA.   Impression: 1. Pain with urination   2. Frequency of urination       Plan: UA C&S sent Meds ordered this encounter  Medications  . sulfamethoxazole-trimethoprim (BACTRIM DS,SEPTRA DS) 800-160 MG tablet    Sig: Take 1 tablet by mouth 2 (two) times daily. Take 1 bid    Dispense:  14 tablet    Refill:  0    Order Specific Question:   Supervising Provider    Answer:   Despina Hidden, LUTHER H [[id number]]  . phenazopyridine (PYRIDIUM) 200 MG tablet    Sig: Take 1 tablet (200 mg total) by mouth 3 (three) times daily as needed for pain.    Dispense:  10 tablet    Refill:  0    Order Specific Question:   Supervising Provider    Answer:    Duane Lope H [[id number]]  F/U in 2 weeks will recheck urine Push fluids No sex

## 2019-03-17 LAB — URINALYSIS, ROUTINE W REFLEX MICROSCOPIC
Bilirubin, UA: NEGATIVE
Glucose, UA: NEGATIVE
Ketones, UA: NEGATIVE
Leukocytes, UA: NEGATIVE
Nitrite, UA: NEGATIVE
Protein, UA: NEGATIVE
RBC, UA: NEGATIVE
Specific Gravity, UA: 1.027 (ref 1.005–1.030)
UUROB: 0.2 mg/dL (ref 0.2–1.0)
pH, UA: 5 (ref 5.0–7.5)

## 2019-03-18 LAB — URINE CULTURE

## 2019-03-23 DIAGNOSIS — J329 Chronic sinusitis, unspecified: Secondary | ICD-10-CM | POA: Diagnosis not present

## 2019-03-27 ENCOUNTER — Telehealth: Payer: Self-pay | Admitting: *Deleted

## 2019-03-27 NOTE — Telephone Encounter (Signed)
Pt aware she don't need to see [facility] Monday. Just come by office and leave urine for culture per Jenn. Pt voiced understanding. JSY

## 2019-03-30 ENCOUNTER — Encounter: Payer: Self-pay | Admitting: Obstetrics & Gynecology

## 2019-03-30 ENCOUNTER — Encounter: Payer: BLUE CROSS/BLUE SHIELD | Admitting: Adult Health

## 2019-03-30 ENCOUNTER — Other Ambulatory Visit: Payer: BLUE CROSS/BLUE SHIELD

## 2019-03-31 ENCOUNTER — Ambulatory Visit: Payer: BLUE CROSS/BLUE SHIELD

## 2019-04-01 ENCOUNTER — Ambulatory Visit: Payer: BLUE CROSS/BLUE SHIELD

## 2019-05-11 ENCOUNTER — Ambulatory Visit: Payer: Self-pay

## 2019-05-20 DIAGNOSIS — R350 Frequency of micturition: Secondary | ICD-10-CM | POA: Diagnosis not present

## 2019-05-20 DIAGNOSIS — R109 Unspecified abdominal pain: Secondary | ICD-10-CM | POA: Diagnosis not present

## 2019-05-20 DIAGNOSIS — R102 Pelvic and perineal pain: Secondary | ICD-10-CM | POA: Diagnosis not present

## 2019-05-28 ENCOUNTER — Telehealth: Payer: Self-pay | Admitting: Adult Health

## 2019-05-28 NOTE — Telephone Encounter (Signed)
Pt requesting an appt for a check up and possible kidney infection.

## 2019-05-28 NOTE — Telephone Encounter (Signed)
Call can't be completed @ 3:02 pm. JSY

## 2019-05-28 NOTE — Telephone Encounter (Signed)
Call can't be completed @ 4:51 pm. JSY

## 2019-05-29 NOTE — Telephone Encounter (Signed)
Call can't be completed @ 12:04 pm. 3 attempts to reach pt. Encounter closed. JSY

## 2019-06-02 ENCOUNTER — Other Ambulatory Visit: Payer: Self-pay

## 2019-06-02 ENCOUNTER — Ambulatory Visit
Admission: EM | Admit: 2019-06-02 | Discharge: 2019-06-02 | Disposition: A | Discharge status: Home / Self Care | Payer: BC Managed Care – PPO | Attending: Emergency Medicine | Admitting: Emergency Medicine

## 2019-06-02 DIAGNOSIS — W57XXXA Bitten or stung by nonvenomous insect and other nonvenomous arthropods, initial encounter: Secondary | ICD-10-CM | POA: Diagnosis not present

## 2019-06-02 DIAGNOSIS — R519 Headache, unspecified: Secondary | ICD-10-CM

## 2019-06-02 DIAGNOSIS — S60861A Insect bite (nonvenomous) of right wrist, initial encounter: Secondary | ICD-10-CM | POA: Diagnosis not present

## 2019-06-02 MED ORDER — IBUPROFEN 800 MG PO TABS
800.0000 mg | ORAL_TABLET | Freq: Once | ORAL | Status: AC
  Administered 2019-06-02: 800 mg via ORAL

## 2019-06-02 MED ORDER — MUPIROCIN CALCIUM 2 % EX CREA: 1.0000 "application " | TOPICAL_CREAM | Freq: Two times a day (BID) | CUTANEOUS | 0 refills | Status: DC

## 2019-06-02 NOTE — ED Provider Notes (Addendum)
[place]   [id number] 06/02/19 Arrival Time: 3474  CC: Insect bite and HA  SUBJECTIVE:  Morgan [name] is a 23 y.o. female who presents with possible insect bite to right wrist that occurred last night.  States she thought a mosquito bit her, and then she scratched it.  Localizes bite to right posterior wrist.  Describes it as painful, red and itchy.  Symptoms are made worse to the touch.  Denies similar symptoms in the past.    Also mentions HA and mild nausea that began this morning.  Has not tried OTC medications.    Denies fever, chills, photophobia, vomiting, swelling, discharge, chest pain, abdominal pain, changes in bowel or bladder function.    ROS: As per HPI.  Past Medical History:  Diagnosis Date  . Contraceptive management 04/25/2016   Past Surgical History:  Procedure Laterality Date  . ROOT CANAL  03/11/2017   No Known Allergies No current facility-administered medications on file prior to encounter.    Current Outpatient Medications on File Prior to Encounter  Medication Sig Dispense Refill  . medroxyPROGESTERone (DEPO-PROVERA) 150 MG/ML injection Inject 1 mL (150 mg total) into the muscle every 3 (three) months. 1 mL 4  . ondansetron (ZOFRAN) 4 MG tablet Take 1 tablet (4 mg total) by mouth every 6 (six) hours. (Patient not taking: Reported on 03/16/2019) 12 tablet 0   Social History   Socioeconomic History  . Marital status: Single    Spouse name: Not on file  . Number of children: Not on file  . Years of education: Not on file  . Highest education level: Not on file  Occupational History  . Not on file  Social Needs  . Financial resource strain: Not on file  . Food insecurity:    Worry: Not on file    Inability: Not on file  . Transportation needs:    Medical: Not on file    Non-medical: Not on file  Tobacco Use  . Smoking status: Never Smoker  . Smokeless tobacco: Never Used  Substance and Sexual Activity  . Alcohol use: No  . Drug  use: No  . Sexual activity: Yes    Birth control/protection: Condom, Injection  Lifestyle  . Physical activity:    Days per week: Not on file    Minutes per session: Not on file  . Stress: Not on file  Relationships  . Social connections:    Talks on phone: Not on file    Gets together: Not on file    Attends religious service: Not on file    Active member of club or organization: Not on file    Attends meetings of clubs or organizations: Not on file    Relationship status: Not on file  . Intimate partner violence:    Fear of current or ex partner: Not on file    Emotionally abused: Not on file    Physically abused: Not on file    Forced sexual activity: Not on file  Other Topics Concern  . Not on file  Social History Narrative  . Not on file   Family History  Problem Relation Age of Onset  . Diabetes Paternal Aunt   . Cancer Paternal Aunt   . Alzheimer's disease Paternal Aunt   . Diabetes Maternal Grandmother   . Cancer Paternal Grandmother   . Stroke Paternal Grandmother   . Heart attack Paternal Grandmother   . Diabetes Paternal Grandmother   . Diabetes Other  OBJECTIVE: Vitals:   06/02/19 1525  BP: 109/73  Pulse: 95  Resp: 20  Temp: 99.1 F (37.3 C)  SpO2: 97%    General appearance: alert; no distress HENT: NCAT; PERRL, EOMI grossly; nares patent without rhinorrhea; oropharynx clear Lungs: clear to auscultation bilaterally Heart: regular rate and rhythm.  Radial pulse 2+ bilaterally Abdomen: soft, nondistended, normal active bowel sounds; nontender to palpation; no guarding  Extremities: no edema Neuro: CN 2-12 grossly intact; normal gait; moves all extremities without difficulty Skin: warm and dry; 2 mm scab to posterior RT wrist with mild erythema in linear pattern, mildly TTP, no obvious bleeding or discharge (see picture below) Psychological: alert and cooperative; normal mood and affect      ASSESSMENT & PLAN:  1. Insect bite of right  wrist, initial encounter   2. Acute nonintractable headache, unspecified headache type     Meds ordered this encounter  Medications  . ibuprofen (ADVIL) tablet 800 mg  . mupirocin cream (BACTROBAN) 2 %    Sig: Apply 1 application topically 2 (two) times daily.    Dispense:  15 g    Refill:  0    Order Specific Question:   Supervising Provider    Answer:   Eustace MooreELSON, YVONNE SUE [[id number]]   Declines tetanus prophylaxis  Ibuprofen 800mg  given in office for headache Rest and push fluids Bactroban cream prescribed.  Apply to insect bite twice daily for 7-10 days Elevated wrist and applied cold compresses as needed Follow up with PCP if symptoms persists.  If you do not have a PCP I have attached a list of local PCPs that you may follow up with Return or go to the ER if you have any new or worsening symptoms such as fever, chills, nausea, vomiting, redness, swelling, discharge, if symptoms do not improve with medications, abdominal pain, vomiting, muscle cramps, muscle spasms, etc...  Reviewed expectations re: course of current medical issues. Questions answered. Outlined signs and symptoms indicating need for more acute intervention. Patient verbalized understanding. After Visit Summary given.     Rennis HardingWurst, Dayan Kreis, PA-C 06/02/19 1551

## 2019-06-02 NOTE — Discharge Instructions (Signed)
Ibuprofen given in office for headache Rest and push fluids Bactroban cream prescribed.  Apply to insect bite twice daily for 7-10 days Elevated wrist and applied cold compresses as needed Follow up with PCP if symptoms persists.  If you do not have a PCP I have attached a list of local PCPs that you may follow up with Return or go to the ER if you have any new or worsening symptoms such as fever, chills, nausea, vomiting, redness, swelling, discharge, if symptoms do not improve with medications, abdominal pain, vomiting, muscle cramps, muscle spasms, etc..Marland Kitchen

## 2019-06-02 NOTE — ED Triage Notes (Signed)
Pt has red area on right wrist, woke up with bite and states that it is painful

## 2019-06-22 ENCOUNTER — Encounter: Payer: Self-pay | Admitting: *Deleted

## 2019-06-23 ENCOUNTER — Encounter: Payer: Self-pay | Admitting: Obstetrics & Gynecology

## 2019-06-23 ENCOUNTER — Other Ambulatory Visit: Payer: Self-pay

## 2019-06-23 ENCOUNTER — Ambulatory Visit (INDEPENDENT_AMBULATORY_CARE_PROVIDER_SITE_OTHER): Payer: BC Managed Care – PPO | Admitting: Obstetrics & Gynecology

## 2019-06-23 VITALS — BP 118/74 | HR 68 | Ht 61.0 in | Wt 143.0 lb

## 2019-06-23 DIAGNOSIS — Z3042 Encounter for surveillance of injectable contraceptive: Secondary | ICD-10-CM | POA: Diagnosis not present

## 2019-06-23 DIAGNOSIS — N764 Abscess of vulva: Secondary | ICD-10-CM | POA: Diagnosis not present

## 2019-06-23 DIAGNOSIS — Z113 Encounter for screening for infections with a predominantly sexual mode of transmission: Secondary | ICD-10-CM

## 2019-06-23 MED ORDER — DOXYCYCLINE HYCLATE 100 MG PO TABS: 100.0000 mg | ORAL_TABLET | Freq: Two times a day (BID) | ORAL | 0 refills | Status: DC

## 2019-06-23 MED ORDER — MEDROXYPROGESTERONE ACETATE 150 MG/ML IM SUSP: 150.0000 mg | INTRAMUSCULAR | 3 refills | Status: DC

## 2019-06-23 MED ORDER — SILVER SULFADIAZINE 1 % EX CREA: TOPICAL_CREAM | CUTANEOUS | 11 refills | Status: DC

## 2019-06-23 NOTE — Progress Notes (Signed)
Chief Complaint  Patient presents with  . Contraception    depo, also has spot to look at       23 y.o. G0P0000 No LMP recorded (lmp unknown). The current method of family planning is none.  Outpatient Encounter Medications as of 06/23/2019  Medication Sig  . doxycycline (VIBRA-TABS) 100 MG tablet Take 1 tablet (100 mg total) by mouth 2 (two) times daily.  . medroxyPROGESTERone (DEPO-PROVERA) 150 MG/ML injection Inject 1 mL (150 mg total) into the muscle every 3 (three) months.  . medroxyPROGESTERone (DEPO-PROVERA) 150 MG/ML injection Inject 1 mL (150 mg total) into the muscle every 3 (three) months.  . silver sulfADIAZINE (SILVADENE) 1 % cream Use to are 3 times and as directed  . [DISCONTINUED] mupirocin cream (BACTROBAN) 2 % Apply 1 application topically 2 (two) times daily.  . [DISCONTINUED] ondansetron (ZOFRAN) 4 MG tablet Take 1 tablet (4 mg total) by mouth every 6 (six) hours. (Patient not taking: Reported on 03/16/2019)   No facility-administered encounter medications on file as of 06/23/2019.     Subjective Morgan [name] is here for 3 reasons: 1) wants STI screen: routine no symptoms  2)restart Depo provera for cycle management: no sexual activity but wants it for cycle management  3) bump on her labia for past month or so, has never had before, normally gets waxed but has been shaving which has probably led to this  Past Medical History:  Diagnosis Date  . Contraceptive management 04/25/2016    Past Surgical History:  Procedure Laterality Date  . ROOT CANAL  03/11/2017    OB History    Gravida  0   Para  0   Term  0   Preterm  0   AB  0   Living  0     SAB  0   TAB  0   Ectopic  0   Multiple  0   Live Births              No Known Allergies  Social History   Socioeconomic History  . Marital status: Single    Spouse name: Not on file  . Number of children: Not on file  . Years of education: Not on file  . Highest education  level: Not on file  Occupational History  . Not on file  Social Needs  . Financial resource strain: Not on file  . Food insecurity    Worry: Not on file    Inability: Not on file  . Transportation needs    Medical: Not on file    Non-medical: Not on file  Tobacco Use  . Smoking status: Never Smoker  . Smokeless tobacco: Never Used  Substance and Sexual Activity  . Alcohol use: No  . Drug use: No  . Sexual activity: Yes    Birth control/protection: Condom, Injection  Lifestyle  . Physical activity    Days per week: Not on file    Minutes per session: Not on file  . Stress: Not on file  Relationships  . Social Herbalist on phone: Not on file    Gets together: Not on file    Attends religious service: Not on file    Active member of club or organization: Not on file    Attends meetings of clubs or organizations: Not on file    Relationship status: Not on file  Other Topics Concern  . Not on file  Social  History Narrative  . Not on file    Family History  Problem Relation Age of Onset  . Diabetes Paternal Aunt   . Cancer Paternal Aunt   . Alzheimer's disease Paternal Aunt   . Diabetes Maternal Grandmother   . Cancer Paternal Grandmother   . Stroke Paternal Grandmother   . Heart attack Paternal Grandmother   . Diabetes Paternal Grandmother   . Diabetes Other     Medications:       Current Outpatient Medications:  .  doxycycline (VIBRA-TABS) 100 MG tablet, Take 1 tablet (100 mg total) by mouth 2 (two) times daily., Disp: 20 tablet, Rfl: 0 .  medroxyPROGESTERone (DEPO-PROVERA) 150 MG/ML injection, Inject 1 mL (150 mg total) into the muscle every 3 (three) months., Disp: 1 mL, Rfl: 4 .  medroxyPROGESTERone (DEPO-PROVERA) 150 MG/ML injection, Inject 1 mL (150 mg total) into the muscle every 3 (three) months., Disp: 1 mL, Rfl: 3 .  silver sulfADIAZINE (SILVADENE) 1 % cream, Use to are 3 times and as directed, Disp: 50 g, Rfl: 11  Objective Blood pressure  118/74, pulse 68, height 5\' 1"  (1.549 m), weight 143 lb (64.9 kg).  General WDWN female NAD Vulva:  Mons pubis infected hair follicle Vagina:  normal mucosa, no discharge Cervix:  no cervical motion tenderness and no lesions Uterus:  not examined Adnexa: ovaries:,     Pertinent ROS Per HPI No burning with urination, frequency or urgency No nausea, vomiting or diarrhea Nor fever chills or other constitutional symptoms   Labs or studies pending    Impression Diagnoses this Encounter::   ICD-10-CM   1. Boil of mons pubis  N76.4    doxycycline + silvadene  2. Screening examination for venereal disease  Z11.3 GC/Chlamydia Probe Amp    RPR    HIV antibody (with reflex)    Hepatitis B Surface AntiGEN  3. Encounter for management and injection of injectable progestin contraceptive  Z30.42    pt to bring back for injection    Established relevant diagnosis(es):   Plan/Recommendations: Meds ordered this encounter  Medications  . doxycycline (VIBRA-TABS) 100 MG tablet    Sig: Take 1 tablet (100 mg total) by mouth 2 (two) times daily.    Dispense:  20 tablet    Refill:  0  . silver sulfADIAZINE (SILVADENE) 1 % cream    Sig: Use to are 3 times and as directed    Dispense:  50 g    Refill:  11  . medroxyPROGESTERone (DEPO-PROVERA) 150 MG/ML injection    Sig: Inject 1 mL (150 mg total) into the muscle every 3 (three) months.    Dispense:  1 mL    Refill:  3    Labs or Scans Ordered: Orders Placed This Encounter  Procedures  . GC/Chlamydia Probe Amp  . RPR  . HIV antibody (with reflex)  . Hepatitis B Surface AntiGEN    Management:: >shave more frequently >restart depo >antibiotic oral and topical >STI results pending  Follow up Return if symptoms worsen or fail to improve.      All questions were answered.

## 2019-06-25 LAB — GC/CHLAMYDIA PROBE AMP
Chlamydia trachomatis, NAA: NEGATIVE
Neisseria Gonorrhoeae by PCR: NEGATIVE

## 2019-07-24 ENCOUNTER — Telehealth: Payer: Self-pay | Admitting: Obstetrics & Gynecology

## 2019-07-24 NOTE — Telephone Encounter (Signed)
Pt states that she is having a foul odor and discharge. She states that she may have a UTI. Please advise.

## 2019-07-27 ENCOUNTER — Other Ambulatory Visit: Payer: BC Managed Care – PPO

## 2019-10-06 ENCOUNTER — Other Ambulatory Visit: Payer: Self-pay

## 2019-10-06 ENCOUNTER — Other Ambulatory Visit (HOSPITAL_COMMUNITY)
Admission: RE | Admit: 2019-10-06 | Discharge: 2019-10-06 | Disposition: A | Discharge status: Home / Self Care | Payer: BC Managed Care – PPO | Source: Ambulatory Visit | Attending: Adult Health | Admitting: Adult Health

## 2019-10-06 ENCOUNTER — Encounter: Payer: Self-pay | Admitting: Obstetrics & Gynecology

## 2019-10-06 ENCOUNTER — Other Ambulatory Visit (INDEPENDENT_AMBULATORY_CARE_PROVIDER_SITE_OTHER): Payer: BC Managed Care – PPO | Admitting: *Deleted

## 2019-10-06 ENCOUNTER — Telehealth: Payer: Self-pay | Admitting: *Deleted

## 2019-10-06 ENCOUNTER — Emergency Department (HOSPITAL_COMMUNITY)
Admission: EM | Admit: 2019-10-06 | Discharge: 2019-10-06 | Discharge status: Absent without leave | Payer: No Typology Code available for payment source

## 2019-10-06 VITALS — BP 122/77 | HR 92

## 2019-10-06 DIAGNOSIS — B9689 Other specified bacterial agents as the cause of diseases classified elsewhere: Secondary | ICD-10-CM | POA: Insufficient documentation

## 2019-10-06 DIAGNOSIS — Z113 Encounter for screening for infections with a predominantly sexual mode of transmission: Secondary | ICD-10-CM | POA: Insufficient documentation

## 2019-10-06 DIAGNOSIS — N898 Other specified noninflammatory disorders of vagina: Secondary | ICD-10-CM | POA: Diagnosis not present

## 2019-10-06 DIAGNOSIS — N760 Acute vaginitis: Secondary | ICD-10-CM | POA: Insufficient documentation

## 2019-10-06 DIAGNOSIS — R300 Dysuria: Secondary | ICD-10-CM

## 2019-10-06 LAB — POCT URINALYSIS DIPSTICK OB
Blood, UA: NEGATIVE
Glucose, UA: NEGATIVE
Ketones, UA: NEGATIVE
Nitrite, UA: NEGATIVE
POC,PROTEIN,UA: NEGATIVE

## 2019-10-06 NOTE — Telephone Encounter (Signed)
Pt requesting visit for self swab, uti and breast lump. Pt to come today for self swab and urine. Scheduled with Anderson Malta for lump check on Friday.

## 2019-10-06 NOTE — Progress Notes (Signed)
   NURSE VISIT- UTI SYMPTOMS   SUBJECTIVE:  Morgan [name] is a 23 y.o. G0P0000 female here for UTI symptoms. She is a GYN patient. She reports dysuria.  OBJECTIVE:  There were no vitals taken for this visit.  Appears well, in no apparent distress  No results found for this or any previous visit (from the past 24 hour(s)).  ASSESSMENT: GYN patient with UTI symptoms and negative nitrites  PLAN: Note routed to Derrek Monaco, AGNP   Rx sent by provider today: No Urine culture sent Call or return to clinic prn if these symptoms worsen or fail to improve as anticipated. Follow-up: as needed   Rash, Celene Squibb  10/06/2019 11:16 AM    NURSE VISIT- VAGINITIS/STD/POC  SUBJECTIVE:  Morgan [name] is a 23 y.o. G0P0000 GYN patientfemale here for a vaginal swab for STD screen.  She reports the following symptoms: discharge described as white for 3 days. Denies abnormal vaginal bleeding, significant pelvic pain, fever, or UTI symptoms.  OBJECTIVE:  There were no vitals taken for this visit.  Appears well, in no apparent distress  ASSESSMENT: Vaginal swab for STD screen  PLAN: Self-collected vaginal probe for Gonorrhea, Chlamydia, Trichomonas, Bacterial Vaginosis, Yeast sent to lab Treatment: to be determined once results are received Follow-up as needed if symptoms persist/worsen, or new symptoms develop  Rash, Celene Squibb  10/06/2019 11:16 AM

## 2019-10-06 NOTE — Progress Notes (Signed)
Chart reviewed for nurse visit. Agree with plan of care.  Derrek Monaco A, NP 10/06/2019 1:20 PM

## 2019-10-08 LAB — URINE CULTURE

## 2019-10-09 ENCOUNTER — Encounter: Payer: Self-pay | Admitting: Adult Health

## 2019-10-09 ENCOUNTER — Other Ambulatory Visit: Payer: Self-pay

## 2019-10-09 ENCOUNTER — Ambulatory Visit (INDEPENDENT_AMBULATORY_CARE_PROVIDER_SITE_OTHER): Payer: BC Managed Care – PPO | Admitting: Adult Health

## 2019-10-09 VITALS — BP 113/66 | HR 87 | Ht 62.0 in | Wt 135.5 lb

## 2019-10-09 DIAGNOSIS — N6310 Unspecified lump in the right breast, unspecified quadrant: Secondary | ICD-10-CM | POA: Diagnosis not present

## 2019-10-09 DIAGNOSIS — N644 Mastodynia: Secondary | ICD-10-CM | POA: Diagnosis not present

## 2019-10-09 DIAGNOSIS — N6452 Nipple discharge: Secondary | ICD-10-CM | POA: Diagnosis not present

## 2019-10-09 LAB — CERVICOVAGINAL ANCILLARY ONLY
Bacterial Vaginitis (gardnerella): POSITIVE — AB
Candida Glabrata: NEGATIVE
Candida Vaginitis: NEGATIVE
Chlamydia: NEGATIVE
Comment: NEGATIVE
Comment: NEGATIVE
Comment: NEGATIVE
Comment: NEGATIVE
Comment: NEGATIVE
Comment: NORMAL
Neisseria Gonorrhea: NEGATIVE
Trichomonas: NEGATIVE

## 2019-10-09 MED ORDER — SULFAMETHOXAZOLE-TRIMETHOPRIM 800-160 MG PO TABS: 1.0000 | ORAL_TABLET | Freq: Two times a day (BID) | ORAL | 0 refills | Status: DC

## 2019-10-09 NOTE — Progress Notes (Signed)
  Subjective:     Patient ID: Morgan [name], female   DOB: [birth year], 23 y.o.   MRN: [id number]  HPI Morgan [name] is a 23 year old white female, single, G0P0, in complaining of breast pain for 1-2 weeks and discharge from nipple at rod insertion and looks like snot. She had rods changed out about 2 weeks ago.  Review of Systems Pain in right breast for 1-2 weeks Has noticed snotty sticky discharge for nipple where rod inserts  Has lump in right breast   Reviewed past medical,surgical, social and family history. Reviewed medications and allergies.     Objective:   Physical Exam BP 113/66 (BP Location: Left Arm, Patient Position: Sitting, Cuff Size: Normal)   Pulse 87   Ht 5\' 2"  (1.575 m)   Wt 135 lb 8 oz (61.5 kg)   LMP 09/29/2019   BMI 24.78 kg/m    Skin warm and dry,  Breasts:no dominate palpable mass, retraction or nipple discharge on left, has nipple rod, on right has pea sized mass about 6-7 0' clock and tenderness from 6-12 o'clock and underarm with ?swollen lymph node, no retraction or nipple discharge today. Co exam with Weyman Croon FNP student.     Assessment:     1. Breast pain   2. Nipple discharge   3. Mass of breast, right       Plan:     -will rx septra ds Meds ordered this encounter  Medications  . sulfamethoxazole-trimethoprim (BACTRIM DS) 800-160 MG tablet    Sig: Take 1 tablet by mouth 2 (two) times daily. Take 1 bid    Dispense:  28 tablet    Refill:  0    Order Specific Question:   Supervising Provider    Answer:   Florian Buff [[id number]]   Change nipple rod, may be reaction to it Will get right breast US at [facility] 10/20 at 11:40 am Return in 1 week for recheck , will consider TSH and Prolactin then Leave breasts alone

## 2019-10-12 ENCOUNTER — Other Ambulatory Visit: Payer: Self-pay | Admitting: Adult Health

## 2019-10-12 MED ORDER — METRONIDAZOLE 500 MG PO TABS: 500.0000 mg | ORAL_TABLET | Freq: Two times a day (BID) | ORAL | 0 refills | Status: DC

## 2019-10-12 NOTE — Progress Notes (Signed)
+  BV on CV swab, will rx flagyl  

## 2019-10-13 ENCOUNTER — Ambulatory Visit (HOSPITAL_COMMUNITY): Payer: BC Managed Care – PPO

## 2019-10-16 ENCOUNTER — Ambulatory Visit (INDEPENDENT_AMBULATORY_CARE_PROVIDER_SITE_OTHER): Payer: BC Managed Care – PPO | Admitting: Adult Health

## 2019-10-16 ENCOUNTER — Encounter: Payer: Self-pay | Admitting: Adult Health

## 2019-10-16 ENCOUNTER — Other Ambulatory Visit: Payer: Self-pay

## 2019-10-16 VITALS — BP 108/73 | HR 95 | Ht 62.0 in | Wt 135.0 lb

## 2019-10-16 DIAGNOSIS — N644 Mastodynia: Secondary | ICD-10-CM | POA: Diagnosis not present

## 2019-10-16 DIAGNOSIS — N6310 Unspecified lump in the right breast, unspecified quadrant: Secondary | ICD-10-CM

## 2019-10-16 NOTE — Progress Notes (Signed)
  Subjective:     Patient ID: Morgan [name], female   DOB: 1996-03-31, 23 y.o.   MRN: [id number]  HPI Morgan [name] is a 23 year old white female, single, G0P0 back in follow up on right breast pain and nipple discharge, no nipple discharge since changing rods, still tender but not has bad.   Review of Systems Right breast still tender, no nipple discharge Reviewed past medical,surgical, social and family history. Reviewed medications and allergies.     Objective:   Physical Exam BP 108/73 (BP Location: Left Arm, Patient Position: Sitting, Cuff Size: Normal)   Pulse 95   Ht 5\' 2"  (1.575 m)   Wt 135 lb (61.2 kg)   LMP 09/29/2019   BMI 24.69 kg/m    Skin warm and dry,  Breasts:no dominate palpable mass, retraction or nipple discharge on the left, right under arm not as tender but still has pea sized mass at about 7 0'clcock, no retraction and since changing nipple rods no sticky nipple discharge on the right    Assessment:     1. Breast pain   2. Mass of breast, right       Plan:     Right breast US rescheduled for 11/3 at 4:10 pm Continue septra ds  Follow up for breast check 11/9 at 4pm

## 2019-10-27 ENCOUNTER — Ambulatory Visit (HOSPITAL_COMMUNITY): Admission: RE | Admit: 2019-10-27 | Payer: BC Managed Care – PPO | Source: Ambulatory Visit

## 2019-11-02 ENCOUNTER — Ambulatory Visit: Payer: BC Managed Care – PPO | Admitting: Adult Health

## 2019-11-17 ENCOUNTER — Encounter: Payer: Self-pay | Admitting: Adult Health

## 2019-11-17 ENCOUNTER — Other Ambulatory Visit: Payer: Self-pay

## 2019-11-17 ENCOUNTER — Other Ambulatory Visit (HOSPITAL_COMMUNITY)
Admission: RE | Admit: 2019-11-17 | Discharge: 2019-11-17 | Disposition: A | Discharge status: Home / Self Care | Payer: BC Managed Care – PPO | Source: Ambulatory Visit | Attending: Adult Health | Admitting: Adult Health

## 2019-11-17 ENCOUNTER — Ambulatory Visit (INDEPENDENT_AMBULATORY_CARE_PROVIDER_SITE_OTHER): Payer: BC Managed Care – PPO | Admitting: Adult Health

## 2019-11-17 VITALS — BP 120/69 | HR 74 | Temp 98.2°F | Ht 62.0 in | Wt 134.0 lb

## 2019-11-17 DIAGNOSIS — R102 Pelvic and perineal pain: Secondary | ICD-10-CM | POA: Diagnosis not present

## 2019-11-17 DIAGNOSIS — R109 Unspecified abdominal pain: Secondary | ICD-10-CM | POA: Insufficient documentation

## 2019-11-17 DIAGNOSIS — R1011 Right upper quadrant pain: Secondary | ICD-10-CM | POA: Insufficient documentation

## 2019-11-17 DIAGNOSIS — Z113 Encounter for screening for infections with a predominantly sexual mode of transmission: Secondary | ICD-10-CM | POA: Insufficient documentation

## 2019-11-17 DIAGNOSIS — R300 Dysuria: Secondary | ICD-10-CM | POA: Diagnosis not present

## 2019-11-17 DIAGNOSIS — N730 Acute parametritis and pelvic cellulitis: Secondary | ICD-10-CM | POA: Insufficient documentation

## 2019-11-17 LAB — POCT URINALYSIS DIPSTICK OB
Blood, UA: NEGATIVE
Glucose, UA: NEGATIVE
Ketones, UA: NEGATIVE
Leukocytes, UA: NEGATIVE
Nitrite, UA: NEGATIVE
POC,PROTEIN,UA: NEGATIVE

## 2019-11-17 MED ORDER — DOXYCYCLINE HYCLATE 100 MG PO CAPS: 100.0000 mg | ORAL_CAPSULE | Freq: Two times a day (BID) | ORAL | 0 refills | Status: DC

## 2019-11-17 MED ORDER — MEDROXYPROGESTERONE ACETATE 150 MG/ML IM SUSP: 150.0000 mg | INTRAMUSCULAR | 3 refills | Status: DC

## 2019-11-17 MED ORDER — CEFTRIAXONE SODIUM 250 MG IJ SOLR
250.0000 mg | Freq: Once | INTRAMUSCULAR | Status: AC
  Administered 2019-11-17: 16:00:00 250 mg via INTRAMUSCULAR

## 2019-11-17 NOTE — Progress Notes (Addendum)
Patient ID: Morgan [name], female   DOB: Feb 04, 1996, 23 y.o.   MRN: [id number] History of Present Illness: Morgan [name] is a 23 year old white female, single, G0P0 in complaining of right side pain into back and low abdomen for 2 days now, denies fever,or odor, has nausea, she thinks it is UTI. Wants STD testing,    Current Medications, Allergies, Past Medical History, Past Surgical History, Family History and Social History were reviewed in [employer] record.     Review of Systems: +pain right side into back x 2 days,  Low abdomen pain Denies fever +nausea  No new sex partners Denies odor or itching  BM is normal, has daily     Physical Exam:BP 120/69 (BP Location: Right Arm, Patient Position: Sitting, Cuff Size: Normal)   Pulse 74   Temp 98.2 F (36.8 C)   Ht _0  (1.575 m)   Wt 134 lb (60.8 kg)   BMI 24.51 kg/m urine dipstick is negative.LMP first of November  General:  Well developed, well nourished, no acute distress Skin:  Warm and dry Lungs; Clear to auscultation bilaterally Cardiovascular: Regular rate and rhythm Abdomen:  Soft, +RUQ tenderness, no hepatosplenomegaly, +pain right flank,  Pelvic:  External genitalia is normal in appearance, healing hair bump.  The vagina is normal in appearance. Urethra has no lesions or masses. The cervix is smooth, +CMT, +Vaginal side wall pain.  Uterus is felt to be normal size, shape, and contour, +tenderness.  No adnexal masses, +RLQ  tenderness noted, no rebound..Bladder is mildly tender, no masses felt. Extremities/musculoskeletal:  No swelling or varicosities noted, no clubbing or cyanosis Psych:  No mood changes, alert and cooperative,seems happy Co exam with Morgan Croon FNP student and  Dr Glo Herring. Too get rocephin 250 mg IM in office.   Impression and Plan: 1. Dysuria  2. Screening examination for STD (sexually transmitted disease) CV sent for GC/CH, trich, BV and yeast Check HIV and and RPR  3.  Pelvic pain Pelvic US 11/25 at [facility]  Check CBC with diff and ESR   4. RUQ pain Abd complete 11/25 at 7:30 am at [facility]   5. Right flank pain  6. PID (acute pelvic inflammatory disease) Meds ordered this encounter  Medications  . doxycycline (VIBRAMYCIN) 100 MG capsule    Sig: Take 1 capsule (100 mg total) by mouth 2 (two) times daily.    Dispense:  28 capsule    Refill:  0    Order Specific Question:   Supervising Provider    Answer:   EURE, LUTHER H [[id number]]  . cefTRIAXone (ROCEPHIN) injection 250 mg  Follow up in 6 days Go to ER if pain increases, fever, or vomiting, she voiced understanding.

## 2019-11-18 ENCOUNTER — Ambulatory Visit (HOSPITAL_COMMUNITY): Admission: RE | Admit: 2019-11-18 | Payer: BC Managed Care – PPO | Source: Ambulatory Visit

## 2019-11-18 ENCOUNTER — Telehealth: Payer: Self-pay | Admitting: Adult Health

## 2019-11-18 LAB — CBC WITH DIFFERENTIAL/PLATELET
Basophils Absolute: 0.1 10*3/uL (ref 0.0–0.2)
Basos: 1 %
EOS (ABSOLUTE): 0 10*3/uL (ref 0.0–0.4)
Eos: 1 %
Hematocrit: 41.5 % (ref 34.0–46.6)
Hemoglobin: 14.1 g/dL (ref 11.1–15.9)
Immature Grans (Abs): 0 10*3/uL (ref 0.0–0.1)
Immature Granulocytes: 0 %
Lymphocytes Absolute: 2.2 10*3/uL (ref 0.7–3.1)
Lymphs: 30 %
MCH: 32.6 pg (ref 26.6–33.0)
MCHC: 34 g/dL (ref 31.5–35.7)
MCV: 96 fL (ref 79–97)
Monocytes Absolute: 0.6 10*3/uL (ref 0.1–0.9)
Monocytes: 8 %
Neutrophils Absolute: 4.2 10*3/uL (ref 1.4–7.0)
Neutrophils: 60 %
Platelets: 256 10*3/uL (ref 150–450)
RBC: 4.33 x10E6/uL (ref 3.77–5.28)
RDW: 11.8 % (ref 11.7–15.4)
WBC: 7.1 10*3/uL (ref 3.4–10.8)

## 2019-11-18 LAB — HIV ANTIBODY (ROUTINE TESTING W REFLEX): HIV Screen 4th Generation wRfx: NONREACTIVE

## 2019-11-18 LAB — SEDIMENTATION RATE: Sed Rate: 13 mm/hr (ref 0–32)

## 2019-11-18 LAB — RPR: RPR Ser Ql: NONREACTIVE

## 2019-11-18 NOTE — Telephone Encounter (Signed)
Pt forgot [place] appt this am and rescheduled for Friday, sounds better and still has  some pain, more to front now, she is aware that WBC normal and ESR 13 which is normal and RPR is negative and HIV not back yet

## 2019-11-20 ENCOUNTER — Ambulatory Visit (HOSPITAL_COMMUNITY): Admission: RE | Admit: 2019-11-20 | Payer: BC Managed Care – PPO | Source: Ambulatory Visit

## 2019-11-20 ENCOUNTER — Ambulatory Visit (HOSPITAL_COMMUNITY): Payer: BC Managed Care – PPO | Attending: Adult Health

## 2019-11-20 LAB — CERVICOVAGINAL ANCILLARY ONLY
Bacterial Vaginitis (gardnerella): POSITIVE — AB
Candida Glabrata: NEGATIVE
Candida Vaginitis: NEGATIVE
Chlamydia: NEGATIVE
Comment: NEGATIVE
Comment: NEGATIVE
Comment: NEGATIVE
Comment: NEGATIVE
Comment: NEGATIVE
Comment: NORMAL
Neisseria Gonorrhea: NEGATIVE
Trichomonas: NEGATIVE

## 2019-11-23 ENCOUNTER — Ambulatory Visit: Payer: BC Managed Care – PPO | Admitting: Adult Health

## 2019-11-23 ENCOUNTER — Other Ambulatory Visit: Payer: Self-pay | Admitting: Adult Health

## 2019-11-23 ENCOUNTER — Telehealth: Payer: Self-pay | Admitting: Adult Health

## 2019-11-23 MED ORDER — METRONIDAZOLE 500 MG PO TABS: 500.0000 mg | ORAL_TABLET | Freq: Two times a day (BID) | ORAL | 0 refills | Status: DC

## 2019-11-23 NOTE — Progress Notes (Signed)
CV swab +BV will rx flagyl  

## 2019-11-23 NOTE — Telephone Encounter (Signed)
I called pt as she missed appt today, she says she feels bad, and is short of breath and is going to ER. Pt did not get [place] on Friday because she felt bad then too, like COVID symptoms, hope she feels better and keep me posted.

## 2020-01-26 ENCOUNTER — Other Ambulatory Visit: Payer: Self-pay

## 2020-01-26 ENCOUNTER — Encounter: Payer: Self-pay | Admitting: Adult Health

## 2020-01-26 ENCOUNTER — Ambulatory Visit (INDEPENDENT_AMBULATORY_CARE_PROVIDER_SITE_OTHER): Payer: BC Managed Care – PPO | Admitting: Adult Health

## 2020-01-26 VITALS — BP 113/71 | HR 79 | Ht 61.0 in | Wt 131.0 lb

## 2020-01-26 DIAGNOSIS — R350 Frequency of micturition: Secondary | ICD-10-CM

## 2020-01-26 DIAGNOSIS — B9689 Other specified bacterial agents as the cause of diseases classified elsewhere: Secondary | ICD-10-CM

## 2020-01-26 DIAGNOSIS — Z30013 Encounter for initial prescription of injectable contraceptive: Secondary | ICD-10-CM

## 2020-01-26 DIAGNOSIS — Z113 Encounter for screening for infections with a predominantly sexual mode of transmission: Secondary | ICD-10-CM

## 2020-01-26 DIAGNOSIS — N760 Acute vaginitis: Secondary | ICD-10-CM | POA: Diagnosis not present

## 2020-01-26 DIAGNOSIS — Z3202 Encounter for pregnancy test, result negative: Secondary | ICD-10-CM | POA: Diagnosis not present

## 2020-01-26 DIAGNOSIS — N898 Other specified noninflammatory disorders of vagina: Secondary | ICD-10-CM | POA: Diagnosis not present

## 2020-01-26 DIAGNOSIS — R8290 Unspecified abnormal findings in urine: Secondary | ICD-10-CM | POA: Diagnosis not present

## 2020-01-26 LAB — POCT URINALYSIS DIPSTICK
Blood, UA: NEGATIVE
Glucose, UA: NEGATIVE
Ketones, UA: NEGATIVE
Leukocytes, UA: NEGATIVE
Nitrite, UA: NEGATIVE
Protein, UA: NEGATIVE

## 2020-01-26 LAB — POCT WET PREP (WET MOUNT)
Clue Cells Wet Prep Whiff POC: POSITIVE
Trichomonas Wet Prep HPF POC: ABSENT
WBC, Wet Prep HPF POC: POSITIVE

## 2020-01-26 LAB — POCT URINE PREGNANCY: Preg Test, Ur: NEGATIVE

## 2020-01-26 MED ORDER — METRONIDAZOLE 500 MG PO TABS: 500.0000 mg | ORAL_TABLET | Freq: Two times a day (BID) | ORAL | 1 refills | Status: DC

## 2020-01-26 MED ORDER — MEDROXYPROGESTERONE ACETATE 150 MG/ML IM SUSP: 150.0000 mg | INTRAMUSCULAR | 4 refills | Status: DC

## 2020-01-26 NOTE — Progress Notes (Signed)
  Subjective:     Patient ID: Morgan [name], female   DOB: [birth year], 24 y.o.   MRN: [id number]  HPI Morgan [name] is a 24 year old black female, single, G0P0, in complaining of vaginal burning, odor, vaginal discharge and urinary frequency.Had just finished flagyl not long ago.  She wants to get back on depo.  Review of Systems +vaginal burning +vaginal discharge with odor +urine odor +urinary frequency  Some pain in vagina a times Reviewed past medical,surgical, social and family history. Reviewed medications and allergies.      Objective:   Physical Exam BP 113/71 (BP Location: Left Arm, Patient Position: Sitting, Cuff Size: Normal)   Pulse 79   Ht 5\' 1"  (1.549 m)   Wt 131 lb (59.4 kg)   LMP 01/19/2020   BMI 24.75 kg/m UPT is negative, Urine dipstick urine. Skin warm and dry.Pelvic: external genitalia is normal in appearance no lesions, vagina: white discharge with odor,urethra has no lesions or masses noted, cervix:smooth, uterus: normal size, shape and contour, mildly tender, no masses felt, adnexa: no masses or tenderness noted. Bladder is non tender and no masses felt. Wet prep: + for clue cells and +WBCs. Nuswab obtained  Fall risk is low. Examination chaperoned by 01/21/2020 NP student    Assessment:     1. Pregnancy examination or test, negative result  2. Abnormal urine odor UA C&S sent  3. Urinary frequency UA C&S sent  4. Vaginal discharge Will rx flagyl Nuswab sent   5. BV (bacterial vaginosis) Will rx flagyl  Meds ordered this encounter  Medications  . medroxyPROGESTERone (DEPO-PROVERA) 150 MG/ML injection    Sig: Inject 1 mL (150 mg total) into the muscle every 3 (three) months.    Dispense:  1 mL    Refill:  4    Order Specific Question:   Supervising Provider    Answer:   Richelle Ito, LUTHER H [[id number]]  . metroNIDAZOLE (FLAGYL) 500 MG tablet    Sig: Take 1 tablet (500 mg total) by mouth 2 (two) times daily.    Dispense:  14 tablet    Refill:  1   Order Specific Question:   Supervising Provider    Answer:   Despina Hidden, LUTHER H [[id number]]    6. Screening examination for STD (sexually transmitted disease) Nuswab sent   7. Encounter for initial prescription of injectable contraceptive Meds ordered this encounter  Medications  . medroxyPROGESTERone (DEPO-PROVERA) 150 MG/ML injection    Sig: Inject 1 mL (150 mg total) into the muscle every 3 (three) months.    Dispense:  1 mL    Refill:  4    Order Specific Question:   Supervising Provider    Answer:   Despina Hidden, LUTHER H [[id number]]  . metroNIDAZOLE (FLAGYL) 500 MG tablet    Sig: Take 1 tablet (500 mg total) by mouth 2 (two) times daily.    Dispense:  14 tablet    Refill:  1    Order Specific Question:   Supervising Provider    Answer:   Despina Hidden [[id number]]  Call with next period for depo   8. Vaginal odor Will rx flagyl    Plan:     Call with period for depo Use condoms

## 2020-01-27 LAB — URINALYSIS, ROUTINE W REFLEX MICROSCOPIC
Bilirubin, UA: NEGATIVE
Glucose, UA: NEGATIVE
Leukocytes,UA: NEGATIVE
Nitrite, UA: NEGATIVE
Protein,UA: NEGATIVE
RBC, UA: NEGATIVE
Specific Gravity, UA: 1.029 (ref 1.005–1.030)
Urobilinogen, Ur: 1 mg/dL (ref 0.2–1.0)
pH, UA: 6 (ref 5.0–7.5)

## 2020-01-28 ENCOUNTER — Telehealth: Payer: Self-pay | Admitting: Adult Health

## 2020-01-28 ENCOUNTER — Encounter: Payer: Self-pay | Admitting: Adult Health

## 2020-01-28 DIAGNOSIS — A749 Chlamydial infection, unspecified: Secondary | ICD-10-CM

## 2020-01-28 HISTORY — DX: Chlamydial infection, unspecified: A74.9

## 2020-01-28 LAB — NUSWAB VAGINITIS PLUS (VG+)
Atopobium vaginae: HIGH Score — AB
BVAB 2: HIGH Score — AB
Candida albicans, NAA: NEGATIVE
Candida glabrata, NAA: NEGATIVE
Chlamydia trachomatis, NAA: POSITIVE — AB
Neisseria gonorrhoeae, NAA: NEGATIVE
Trich vag by NAA: NEGATIVE

## 2020-01-28 LAB — URINE CULTURE

## 2020-01-28 MED ORDER — AZITHROMYCIN 500 MG PO TABS: ORAL_TABLET | ORAL | 0 refills | Status: DC

## 2020-01-28 NOTE — Telephone Encounter (Signed)
Can't get through on cell, Nuswab +BV is already being treated, and +chalmydia, rx sent for azithromycin to [facility], no sex, needs appt in 4 weeks for POC, [facility] sent, and partner needs to be treated, so need his name dob and allergies and drug store or he can go to PCP or clinic

## 2020-01-28 NOTE — Telephone Encounter (Signed)
Opened in error

## 2020-01-29 ENCOUNTER — Telehealth: Payer: Self-pay | Admitting: Adult Health

## 2020-01-29 NOTE — Telephone Encounter (Signed)
Can not be completed at this time  [facility] and she has picked up rx for azithromycin

## 2020-04-04 ENCOUNTER — Ambulatory Visit: Payer: BC Managed Care – PPO | Attending: Internal Medicine

## 2020-04-04 ENCOUNTER — Other Ambulatory Visit: Payer: Self-pay

## 2020-04-04 DIAGNOSIS — Z20822 Contact with and (suspected) exposure to covid-19: Secondary | ICD-10-CM

## 2020-04-05 LAB — NOVEL CORONAVIRUS, NAA: SARS-CoV-2, NAA: NOT DETECTED

## 2020-04-05 LAB — SARS-COV-2, NAA 2 DAY TAT

## 2020-04-06 ENCOUNTER — Ambulatory Visit: Payer: BC Managed Care – PPO | Admitting: Adult Health

## 2020-04-07 ENCOUNTER — Ambulatory Visit: Payer: BC Managed Care – PPO | Admitting: Adult Health

## 2020-04-13 ENCOUNTER — Ambulatory Visit: Payer: BC Managed Care – PPO | Admitting: Orthopedic Surgery

## 2020-04-15 ENCOUNTER — Ambulatory Visit: Payer: BC Managed Care – PPO | Admitting: Adult Health

## 2020-04-21 ENCOUNTER — Other Ambulatory Visit: Payer: Self-pay

## 2020-04-21 ENCOUNTER — Encounter: Payer: Self-pay | Admitting: Adult Health

## 2020-04-21 ENCOUNTER — Ambulatory Visit (INDEPENDENT_AMBULATORY_CARE_PROVIDER_SITE_OTHER): Payer: BC Managed Care – PPO | Admitting: Adult Health

## 2020-04-21 VITALS — BP 111/76 | HR 74 | Ht 61.0 in | Wt 129.0 lb

## 2020-04-21 DIAGNOSIS — N760 Acute vaginitis: Secondary | ICD-10-CM | POA: Diagnosis not present

## 2020-04-21 DIAGNOSIS — N898 Other specified noninflammatory disorders of vagina: Secondary | ICD-10-CM

## 2020-04-21 DIAGNOSIS — Z8619 Personal history of other infectious and parasitic diseases: Secondary | ICD-10-CM

## 2020-04-21 DIAGNOSIS — B9689 Other specified bacterial agents as the cause of diseases classified elsewhere: Secondary | ICD-10-CM | POA: Diagnosis not present

## 2020-04-21 LAB — POCT WET PREP (WET MOUNT)
Clue Cells Wet Prep Whiff POC: POSITIVE
WBC, Wet Prep HPF POC: POSITIVE

## 2020-04-21 MED ORDER — METRONIDAZOLE 0.75 % VA GEL: 1.0000 | Freq: Every day | VAGINAL | 0 refills | Status: DC

## 2020-04-21 NOTE — Progress Notes (Signed)
  Subjective:     Patient ID: Morgan [name], female   DOB: 08-Jan-1996, 24 y.o.   MRN: [id number]  HPI Morgan [name] is a 24 year old white female,single,G0P0, in for proof of treatment for chlamydia in February, has discharge with itching and odor.   Review of Systems Has vaginal itching and discharge with odor No sex since treatment for chlamydia Reviewed past medical,surgical, social and family history. Reviewed medications and allergies.     Objective:   Physical Exam BP 111/76 (BP Location: Left Arm, Patient Position: Sitting, Cuff Size: Normal)   Pulse 74   Ht 5\' 1"  (1.549 m)   Wt 129 lb (58.5 kg)   LMP 04/09/2020   BMI 24.37 kg/m  Skin warm and dry.Pelvic: external genitalia is normal in appearance no lesions, vagina: white discharge with odor,urethra has no lesions or masses noted, cervix:smooth, uterus: normal size, shape and contour, non tender, no masses felt, adnexa: no masses or tenderness noted. Bladder is non tender and no masses felt. Wet prep: + for clue cells and +WBCs. Nuswab obtained. Examination chaperoned by 04/11/2020 LPN    Assessment:     1. Vaginal discharge nuswab sent  2. Vaginal odor nuswab sent  3. BV (bacterial vaginosis) nuswab sent Will Rx Metrogel Meds ordered this encounter  Medications  . metroNIDAZOLE (METROGEL VAGINAL) 0.75 % vaginal gel    Sig: Place 1 Applicatorful vaginally at bedtime.    Dispense:  70 g    Refill:  0    Order Specific Question:   Supervising Provider    Answer:   EURE, LUTHER H [[id number]]    4. History of chlamydia Nuswab sent Use condoms  5. Vaginal itching nuswab sent     Plan:     Nuswab sent Use condoms if has sex Follow up prn

## 2020-04-24 LAB — NUSWAB VAGINITIS PLUS (VG+)
Atopobium vaginae: HIGH Score — AB
BVAB 2: HIGH Score — AB
Candida albicans, NAA: NEGATIVE
Candida glabrata, NAA: NEGATIVE
Chlamydia trachomatis, NAA: NEGATIVE
Megasphaera 1: HIGH Score — AB
Neisseria gonorrhoeae, NAA: NEGATIVE
Trich vag by NAA: NEGATIVE

## 2020-04-25 ENCOUNTER — Telehealth: Payer: Self-pay | Admitting: Adult Health

## 2020-04-25 NOTE — Telephone Encounter (Signed)
Telephoned patient at home number and patient states is having some abdominal pain on left side with Metrogel. Advised patient may be able to change Rx to Flagyl and patient states unable to take that. One more day left on Metrogel if problems after finishing will schedule appointment for follow up. Patient voiced understanding.

## 2020-04-25 NOTE — Telephone Encounter (Signed)
Having an issue with the gel for BV and having some abd pain she needs to discuss

## 2020-04-29 ENCOUNTER — Encounter: Payer: Self-pay | Admitting: Orthopedic Surgery

## 2020-04-29 ENCOUNTER — Ambulatory Visit (INDEPENDENT_AMBULATORY_CARE_PROVIDER_SITE_OTHER): Payer: BC Managed Care – PPO | Admitting: Orthopedic Surgery

## 2020-04-29 ENCOUNTER — Other Ambulatory Visit: Payer: Self-pay

## 2020-04-29 ENCOUNTER — Ambulatory Visit: Payer: BC Managed Care – PPO

## 2020-04-29 VITALS — BP 117/70 | HR 77 | Ht 61.0 in | Wt 132.0 lb

## 2020-04-29 DIAGNOSIS — M25531 Pain in right wrist: Secondary | ICD-10-CM

## 2020-04-29 DIAGNOSIS — S6980XA Other specified injuries of unspecified wrist, hand and finger(s), initial encounter: Secondary | ICD-10-CM | POA: Diagnosis not present

## 2020-04-29 DIAGNOSIS — M25532 Pain in left wrist: Secondary | ICD-10-CM | POA: Diagnosis not present

## 2020-04-29 MED ORDER — MELOXICAM 7.5 MG PO TABS: 7.5000 mg | ORAL_TABLET | Freq: Every day | ORAL | 5 refills | Status: DC

## 2020-04-29 NOTE — Patient Instructions (Addendum)
Patellofemoral Pain Syndrome  Patellofemoral pain syndrome is a condition in which the tissue (cartilage) on the underside of the kneecap (patella) softens or breaks down. This causes pain in the front of the knee. The condition is also called runner's knee or chondromalacia patella. Patellofemoral pain syndrome is most common in young adults who are active in sports. The knee is the largest joint in the body. The patella covers the front of the knee and is attached to muscles above and below the knee. The underside of the patella is covered with a smooth type of cartilage (synovium). The smooth surface helps the patella to glide easily when you move your knee. Patellofemoral pain syndrome causes swelling in the joint linings and bone surfaces in the knee. What are the causes? This condition may be caused by:  Overuse of the knee.  Poor alignment of your knee joints.  Weak leg muscles.  A direct blow to your kneecap. What increases the risk? You are more likely to develop this condition if:  You do a lot of activities that can wear down your kneecap. These include: ? Running. ? Squatting. ? Climbing stairs.  You start a new physical activity or exercise program.  You wear shoes that do not fit well.  You do not have good leg strength.  You are overweight. What are the signs or symptoms? The main symptom of this condition is knee pain. This may feel like a dull, aching pain underneath your patella, in the front of your knee. There may be a popping or cracking sound when you move your knee. Pain may get worse with:  Exercise.  Climbing stairs.  Running.  Jumping.  Squatting.  Kneeling.  Sitting for a long time.  Moving or pushing on your patella. How is this diagnosed? This condition may be diagnosed based on:  Your symptoms and medical history. You may be asked about your recent physical activities and which ones cause knee pain.  A physical exam. This may  include: ? Moving your patella back and forth. ? Checking your range of knee motion. ? Having you squat or jump to see if you have pain. ? Checking the strength of your leg muscles.  Imaging tests to confirm the diagnosis. These may include an MRI of your knee. How is this treated? This condition may be treated at home with rest, ice, compression, and elevation (RICE).  Other treatments may include:  Nonsteroidal anti-inflammatory drugs (NSAIDs).  Physical therapy to stretch and strengthen your leg muscles.  Shoe inserts (orthotics) to take stress off your knee.  A knee brace or knee support.  Adhesive tapes to the skin.  Surgery to remove damaged cartilage or move the patella to a better position. This is rare. Follow these instructions at home: If you have a shoe or brace:  Wear the shoe or brace as told by your health care provider. Remove it only as told by your health care provider.  Loosen the shoe or brace if your toes tingle, become numb, or turn cold and blue.  Keep the shoe or brace clean.  If the shoe or brace is not waterproof: ? Do not let it get wet. ? Cover it with a watertight covering when you take a bath or a shower. Managing pain, stiffness, and swelling  If directed, put ice on the painful area. ? If you have a removable shoe or brace, remove it as told by your health care provider. ? Put ice in a plastic bag. ?  Place a towel between your skin and the bag. ? Leave the ice on for 20 minutes, 2-3 times a day.  Move your toes often to avoid stiffness and to lessen swelling.  Rest your knee: ? Avoid activities that cause knee pain. ? When sitting or lying down, raise (elevate) the injured area above the level of your heart, whenever possible. General instructions  Take over-the-counter and prescription medicines only as told by your health care provider.  Use splints, braces, knee supports, or walking aids as directed by your health care  provider.  Perform stretching and strengthening exercises as told by your health care provider or physical therapist.  Do not use any products that contain nicotine or tobacco, such as cigarettes and e-cigarettes. These can delay healing. If you need help quitting, ask your health care provider.  Return to your normal activities as told by your health care provider. Ask your health care provider what activities are safe for you.  Keep all follow-up visits as told by your health care provider. This is important. Contact a health care provider if:  Your symptoms get worse.  You are not improving with home care. Summary  Patellofemoral pain syndrome is a condition in which the tissue (cartilage) on the underside of the kneecap (patella) softens or breaks down.  This condition causes swelling in the joint linings and bone surfaces in the knee. This leads to pain in the front of the knee.  This condition may be treated at home with rest, ice, compression, and elevation (RICE).  Use splints, braces, knee supports, or walking aids as directed by your health care provider. This information is not intended to replace advice given to you by your health care provider. Make sure you discuss any questions you have with your health care provider. Document Revised: 01/20/2018 Document Reviewed: 01/20/2018 Elsevier Patient Education  2020 [employer]  Triangular Fibrocartilage Tear  A triangular fibrocartilage tear is a tear in cartilage or tissues that connect bones to each other (ligaments) along the pinkie side of your wrist. The cartilage and ligaments in your wrist help to cushion and support the bones of your wrist. What are the causes? This condition may be caused by:  Falling onto an outstretched hand and overextending your wrist.  Doing repetitive motions (overuse) that put too much pressure on your wrist. What increases the risk? You are more likely to develop this condition if  you:  Have one forearm bone that is shorter than the other.  Participate in sports that put pressure on the wrist, such as: ? Gymnastics. ? Tennis. ? Golf. ? Baseball. ? Racquetball. ? Hockey. What are the signs or symptoms? Symptoms of this condition include:  Pain or tenderness on the pinkie side of your wrist.  A clicking or popping sensation in the wrist.  Reduced grip strength. How is this diagnosed? This condition may be diagnosed based on:  Your symptoms.  Your medical history.  A physical exam. During the exam, your health care provider may move your hand and wrist to determine what is causing your pain.  Tests, such as: ? An X-ray. This may be done to check for broken bones. ? An MRI. This may be done to check ligaments and cartilage and to look for broken bones that did not show up on your X-ray. ? An arthrogram. This is a kind of X-ray that is taken after a dye is injected into your joint. ? Diagnostic arthroscopy. This is a surgical procedure that  lets your health care provider see inside your wrist joint. It may be done if the cause of your wrist pain is not clear after you have other tests. How is this treated? Treatment for this condition may include:  Resting the wrist. You may need to avoid or modify your participation in sports or other physical activity for a period of time.  Icing the wrist. This can help with swelling and pain.  Keeping the wrist raised (elevated) above your heart. This helps reduce swelling.  Using a splint or cast. This helps keep the wrist still so it can heal.  Doing physical therapy. This helps restore strength and range of motion in the wrist.  Taking anti-inflammatory medicine, such as ibuprofen. These medicines can help reduce pain and swelling.  Having surgery. More serious tears or complete tears may require surgery to repair a ligament. Follow these instructions at home: If you have a splint:  Wear it as told by your  health care provider. Remove it only as told by your health care provider.  Loosen it if your fingers tingle, become numb, or turn cold and blue. If you have a cast:  Do not stick anything inside it to scratch your skin. Doing that increases your risk of infection.  You may put lotion on dry skin around the edges of the cast. Do not put lotion on the skin underneath it. If you have a splint or cast:  Do not put pressure on any part of the splint or cast until it is fully hardened. This may take several hours.  Check the skin around it every day. Tell your health care provider about any concerns.  Keep it clean.  If your splint or cast is not waterproof: ? Do not let it get wet. ? Cover it with a watertight covering when you take a bath or a shower.  Ask your health care provider when it is safe to drive if you have a splint or cast on your hand. Managing pain, stiffness, and swelling   If directed, put ice on the injured area. ? If you have a removable splint, remove it as told by your health care provider. ? Put ice in a plastic bag. ? Place a towel between your skin and the bag or between your cast and the bag. ? Leave the ice on for 20 minutes, 2-3 times a day or as needed.  Move your fingers often to reduce stiffness and swelling.  Elevate the injured area above the level of your heart while you are sitting or lying down. Activity  Return to your normal activities as told by your health care provider. Ask your health care provider what activities are safe for you.  Do not use your wrist to support your body weight until your health care provider says that you can.  Do exercises only as told by your health care provider. General instructions  Take over-the-counter and prescription medicines only as told by your health care provider.  Do not use any products that contain nicotine or tobacco, such as cigarettes, e-cigarettes, and chewing tobacco. These can delay healing.  If you need help quitting, ask your health care provider.  Keep all follow-up visits as told by your health care provider. This is important. How is this prevented?  Warm up and stretch before being active.  Cool down and stretch after being active.  Give your body time to rest between periods of activity.  Be safe and responsible while being active.  This will help you avoid falls.  Maintain physical fitness, including: ? Strength. ? Flexibility. Contact a health care provider if:  Your wrist pain does not improve or gets worse. Get help right away if:  You have severe pain.  You cannot move your wrist. Summary  A triangular fibrocartilage tear is a tear in cartilage or tissues that connect bones to each other (ligaments) along the pinkie side of your wrist.  This condition may be caused by an injury from a fall or by overuse of your wrist due to repetitive movements.  Treatment includes rest, ice, wearing a splint or cast, medicine for pain and swelling, physical therapy, and surgery if needed.  Contact a health care provider if your wrist pain does not improve or gets worse. This information is not intended to replace advice given to you by your health care provider. Make sure you discuss any questions you have with your health care provider. Document Revised: 03/31/2019 Document Reviewed: 11/24/2018 Elsevier Patient Education  2020 [employer]  Journal for Nurse Practitioners, 15(4), [phone]. Retrieved September 29, 2018 from http://clinicalkey.com/nursing">  Knee Exercises Ask your health care provider which exercises are safe for you. Do exercises exactly as told by your health care provider and adjust them as directed. It is normal to feel mild stretching, pulling, tightness, or discomfort as you do these exercises. Stop right away if you feel sudden pain or your pain gets worse. Do not begin these exercises until told by your health care provider. Stretching and  range-of-motion exercises These exercises warm up your muscles and joints and improve the movement and flexibility of your knee. These exercises also help to relieve pain and swelling. Knee extension, prone 15 1. Lie on your abdomen (prone position) on a bed. 2. Place your left / right knee just beyond the edge of the surface so your knee is not on the bed. You can put a towel under your left / right thigh just above your kneecap for comfort. 3. Relax your leg muscles and allow gravity to straighten your knee (extension). You should feel a stretch behind your left / right knee. 4. Hold this position for __________ seconds. 5. Scoot up so your knee is supported between repetitions. Repeat __________ times. Complete this exercise __________ times a day. Knee flexion, active 15                 1. Lie on your back with both legs straight. If this causes back discomfort, bend your left / right knee so your foot is flat on the floor. 2. Slowly slide your left / right heel back toward your buttocks. Stop when you feel a gentle stretch in the front of your knee or thigh (flexion). 3. Hold this position for __________ seconds. 4. Slowly slide your left / right heel back to the starting position. Repeat __________ times. Complete this exercise __________ times a day. Quadriceps stretch, prone 15  1. Lie on your abdomen on a firm surface, such as a bed or padded floor. 2. Bend your left / right knee and hold your ankle. If you cannot reach your ankle or pant leg, loop a belt around your foot and grab the belt instead. 3. Gently pull your heel toward your buttocks. Your knee should not slide out to the side. You should feel a stretch in the front of your thigh and knee (quadriceps). 4. Hold this position for __________ seconds. Repeat __________ times. Complete this exercise __________ times a day. Hamstring,  supine 15 1. Lie on your back (supine position). 2. Loop a belt or towel over the ball of your  left / right foot. The ball of your foot is on the walking surface, right under your toes. 3. Straighten your left / right knee and slowly pull on the belt to raise your leg until you feel a gentle stretch behind your knee (hamstring). ? Do not let your knee bend while you do this. ? Keep your other leg flat on the floor. 4. Hold this position for __________ seconds. Repeat __________ times. Complete this exercise __________ times a day. Strengthening exercises These exercises build strength and endurance in your knee. Endurance is the ability to use your muscles for a long time, even after they get tired. Quadriceps, isometric 15 This exercise stretches the muscles in front of your thigh (quadriceps) without moving your knee joint (isometric). 1. Lie on your back with your left / right leg extended and your other knee bent. Put a rolled towel or small pillow under your knee if told by your health care provider. 2. Slowly tense the muscles in the front of your left / right thigh. You should see your kneecap slide up toward your hip or see increased dimpling just above the knee. This motion will push the back of the knee toward the floor. 3. For __________ seconds, hold the muscle as tight as you can without increasing your pain. 4. Relax the muscles slowly and completely. Repeat __________ times. Complete this exercise __________ times a day. Straight leg raises This exercise stretches the muscles in front of your thigh (quadriceps) and the muscles that move your hips (hip flexors). 1. Lie on your back with your left / right leg extended and your other knee bent. 2. Tense the muscles in the front of your left / right thigh. You should see your kneecap slide up or see increased dimpling just above the knee. Your thigh may even shake a bit. 3. Keep these muscles tight as you raise your leg 4-6 inches (10-15 cm) off the floor. Do not let your knee bend. 4. Hold this position for __________  seconds. 5. Keep these muscles tense as you lower your leg. 6. Relax your muscles slowly and completely after each repetition. Repeat __________ times. Complete this exercise __________ times a day. Hamstring, isometric 15 1. Lie on your back on a firm surface. 2. Bend your left / right knee about __________ degrees. 3. Dig your left / right heel into the surface as if you are trying to pull it toward your buttocks. Tighten the muscles in the back of your thighs (hamstring) to "dig" as hard as you can without increasing any pain. 4. Hold this position for __________ seconds. 5. Release the tension gradually and allow your muscles to relax completely for __________ seconds after each repetition. Repeat __________ times. Complete this exercise __________ times a day. Hamstring curls 15 If told by your health care provider, do this exercise while wearing ankle weights. Begin with __________ lb weights. Then increase the weight by 1 lb (0.5 kg) increments. Do not wear ankle weights that are more than __________ lb. 1. Lie on your abdomen with your legs straight. 2. Bend your left / right knee as far as you can without feeling pain. Keep your hips flat against the floor. 3. Hold this position for __________ seconds. 4. Slowly lower your leg to the starting position. Repeat __________ times. Complete this exercise __________ times a day. Squats 15 This  exercise strengthens the muscles in front of your thigh and knee (quadriceps). 1. Stand in front of a table, with your feet and knees pointing straight ahead. You may rest your hands on the table for balance but not for support. 2. Slowly bend your knees and lower your hips like you are going to sit in a chair. ? Keep your weight over your heels, not over your toes. ? Keep your lower legs upright so they are parallel with the table legs. ? Do not let your hips go lower than your knees. ? Do not bend lower than told by your health care  provider. ? If your knee pain increases, do not bend as low. 3. Hold the squat position for __________ seconds. 4. Slowly push with your legs to return to standing. Do not use your hands to pull yourself to standing. Repeat __________ times. Complete this exercise __________ times a day. Wall slides 15 This exercise strengthens the muscles in front of your thigh and knee (quadriceps). 1. Lean your back against a smooth wall or door, and walk your feet out 18-24 inches (46-61 cm) from it. 2. Place your feet hip-width apart. 3. Slowly slide down the wall or door until your knees bend __________ degrees. Keep your knees over your heels, not over your toes. Keep your knees in line with your hips. 4. Hold this position for __________ seconds. Repeat __________ times. Complete this exercise __________ times a day. Straight leg raises 15 This exercise strengthens the muscles that rotate the leg at the hip and move it away from your body (hip abductors). 1. Lie on your side with your left / right leg in the top position. Lie so your head, shoulder, knee, and hip line up. You may bend your bottom knee to help you keep your balance. 2. Roll your hips slightly forward so your hips are stacked directly over each other and your left / right knee is facing forward. 3. Leading with your heel, lift your top leg 4-6 inches (10-15 cm). You should feel the muscles in your outer hip lifting. ? Do not let your foot drift forward. ? Do not let your knee roll toward the ceiling. 4. Hold this position for __________ seconds. 5. Slowly return your leg to the starting position. 6. Let your muscles relax completely after each repetition. Repeat __________ times. Complete this exercise __________ times a day. Straight leg raises 15 This exercise stretches the muscles that move your hips away from the front of the pelvis (hip extensors). 1. Lie on your abdomen on a firm surface. You can put a pillow under your hips if  that is more comfortable. 2. Tense the muscles in your buttocks and lift your left / right leg about 4-6 inches (10-15 cm). Keep your knee straight as you lift your leg. 3. Hold this position for __________ seconds. 4. Slowly lower your leg to the starting position. 5. Let your leg relax completely after each repetition. Repeat __________ times. Complete this exercise __________ times a day. This information is not intended to replace advice given to you by your health care provider. Make sure you discuss any questions you have with your health care provider. Document Revised: 09/30/2018 Document Reviewed: 09/30/2018 Elsevier Patient Education  2020 [employer].

## 2020-04-29 NOTE — Progress Notes (Signed)
NEW PROBLEM//OFFICE VISIT  Chief Complaint  Patient presents with  . Wrist Pain    bilateral     Morgan [name] presents with bilateral wrist pain she had a left scaphoid fracture back in 2014 was nondisplaced was treated with a cast  She was also evaluated for pain in the right wrist was scheduled for an MRI but never got it  She is now complaining of   Review of Systems  All other systems reviewed and are negative.    Past Medical History:  Diagnosis Date  . Chlamydia 01/28/2020   Treated 01/28/20, POC___________  . Contraceptive management 04/25/2016    Past Surgical History:  Procedure Laterality Date  . ROOT CANAL  03/11/2017    Family History  Problem Relation Age of Onset  . Diabetes Paternal Aunt   . Cancer Paternal Aunt   . Alzheimer's disease Paternal Aunt   . Diabetes Maternal Grandmother   . Cancer Paternal Grandmother   . Stroke Paternal Grandmother   . Heart attack Paternal Grandmother   . Diabetes Paternal Grandmother   . Diabetes Other    Social History   Tobacco Use  . Smoking status: Never Smoker  . Smokeless tobacco: Never Used  Substance Use Topics  . Alcohol use: No  . Drug use: No    No Known Allergies  No outpatient medications have been marked as taking for the 04/29/20 encounter (Office Visit) with Vickki Hearing, MD.    BP 117/70   Pulse 77   Ht 5\' 1"  (1.549 m)   Wt 132 lb (59.9 kg)   LMP 04/09/2020 (Approximate)   BMI 24.94 kg/m   Physical Exam Constitutional:      General: She is not in acute distress.    Appearance: She is well-developed.  Cardiovascular:     Comments: No peripheral edema Skin:    General: Skin is warm and dry.  Neurological:     Mental Status: She is alert and oriented to person, place, and time.     Sensory: No sensory deficit.     Coordination: Coordination normal.     Gait: Gait normal.     Deep Tendon Reflexes: Reflexes are normal and symmetric.     Ortho Exam  Right and left wrist have  similar exams.  The alignment is normal the tenderness is over the TFCC and dorsal wrist joint with pain exacerbated with extension of the wrist.  Neurovascular exam is intact grip strength is normal patient has pain on the ulnar side of the wrist and tenderness in the TFCC area with painful rotation  MEDICAL DECISION MAKING  A.  Encounter Diagnoses  Name Primary?  . Pain in left wrist   . Pain in right wrist   . Injury of triangular fibrocartilage complex (TFCC), unspecified laterality, initial encounter Yes    B. DATA ANALYSED:    IMAGING:  Intraoffice x-rays both wrists show normal anatomy  C. MANAGEMENT   Recommend bilateral wrist splints and meloxicam for 6 weeks if no improvement MRI arthrogram  Meds ordered this encounter  Medications  . meloxicam (MOBIC) 7.5 MG tablet    Sig: Take 1 tablet (7.5 mg total) by mouth daily.    Dispense:  30 tablet    Refill:  5      04/11/2020, MD  04/29/2020 10:16 AM

## 2020-05-05 ENCOUNTER — Ambulatory Visit: Payer: BC Managed Care – PPO | Attending: Internal Medicine

## 2020-05-05 ENCOUNTER — Other Ambulatory Visit: Payer: Self-pay

## 2020-05-05 DIAGNOSIS — Z20822 Contact with and (suspected) exposure to covid-19: Secondary | ICD-10-CM

## 2020-05-06 LAB — NOVEL CORONAVIRUS, NAA: SARS-CoV-2, NAA: NOT DETECTED

## 2020-05-06 LAB — SARS-COV-2, NAA 2 DAY TAT

## 2020-06-17 ENCOUNTER — Ambulatory Visit: Payer: BC Managed Care – PPO | Admitting: Orthopedic Surgery

## 2020-07-07 ENCOUNTER — Encounter: Payer: Self-pay | Admitting: Adult Health

## 2020-07-07 ENCOUNTER — Ambulatory Visit (INDEPENDENT_AMBULATORY_CARE_PROVIDER_SITE_OTHER): Payer: BC Managed Care – PPO | Admitting: Adult Health

## 2020-07-07 VITALS — BP 110/75 | HR 75 | Ht 62.0 in | Wt 132.5 lb

## 2020-07-07 DIAGNOSIS — Z8742 Personal history of other diseases of the female genital tract: Secondary | ICD-10-CM | POA: Diagnosis not present

## 2020-07-07 DIAGNOSIS — Z113 Encounter for screening for infections with a predominantly sexual mode of transmission: Secondary | ICD-10-CM | POA: Diagnosis not present

## 2020-07-07 DIAGNOSIS — N9089 Other specified noninflammatory disorders of vulva and perineum: Secondary | ICD-10-CM | POA: Insufficient documentation

## 2020-07-07 NOTE — Progress Notes (Signed)
°  Subjective:     Patient ID: Morgan [name], female   DOB: December 16, 1996, 24 y.o.   MRN: [id number]  HPI Morgan [name] is a 24 year old white female, single, G0P0, in complaining of vulva swelling used OTC product to lighten peri area, swelling has resolved.   Review of Systems Used OTC lightening product and had vulva swelling, none now Reviewed past medical,surgical, social and family history. Reviewed medications and allergies.     Objective:   Physical Exam BP 110/75 (BP Location: Left Arm, Patient Position: Sitting, Cuff Size: Normal)    Pulse 75    Ht 5\' 2"  (1.575 m)    Wt 132 lb 8 oz (60.1 kg)    LMP 07/01/2020    BMI 24.23 kg/m Skin warm and dry.Pelvic: external genitalia is normal in appearance no lesions, vagina: white discharge without odor,urethra has no lesions or masses noted, cervix:smooth, uterus: normal size, shape and contour, non tender, no masses felt, adnexa: no masses or tenderness noted. Bladder is non tender and no masses felt. Nuswab obtained Examination chaperoned by 09/01/2020 LPN  Fall risk is low.  Assessment:     1. Screening examination for STD (sexually transmitted disease) Nuswab sent  2. Swelling of vulva,resolved.   Do try use that product again   Plan:     Use condoms  Follow up prn

## 2020-07-11 ENCOUNTER — Other Ambulatory Visit: Payer: Self-pay | Admitting: Adult Health

## 2020-07-11 LAB — NUSWAB VAGINITIS PLUS (VG+)
Atopobium vaginae: HIGH Score — AB
Candida albicans, NAA: NEGATIVE
Candida glabrata, NAA: NEGATIVE
Chlamydia trachomatis, NAA: NEGATIVE
Neisseria gonorrhoeae, NAA: NEGATIVE
Trich vag by NAA: NEGATIVE

## 2020-07-11 MED ORDER — METRONIDAZOLE 500 MG PO TABS
500.0000 mg | ORAL_TABLET | Freq: Two times a day (BID) | ORAL | 0 refills | Status: DC
Start: 2020-07-11 — End: 2020-10-31

## 2020-07-11 NOTE — Progress Notes (Signed)
rx flagyl for BV on nuswab

## 2020-09-05 ENCOUNTER — Emergency Department (HOSPITAL_COMMUNITY)
Admission: EM | Admit: 2020-09-05 | Discharge: 2020-09-05 | Disposition: A | Discharge status: Home / Self Care | Payer: Self-pay | Attending: Emergency Medicine | Admitting: Emergency Medicine

## 2020-09-05 ENCOUNTER — Other Ambulatory Visit: Payer: Self-pay

## 2020-09-05 ENCOUNTER — Emergency Department (HOSPITAL_COMMUNITY): Payer: Self-pay

## 2020-09-05 ENCOUNTER — Encounter (HOSPITAL_COMMUNITY): Payer: Self-pay

## 2020-09-05 DIAGNOSIS — Y929 Unspecified place or not applicable: Secondary | ICD-10-CM | POA: Insufficient documentation

## 2020-09-05 DIAGNOSIS — X509XXA Other and unspecified overexertion or strenuous movements or postures, initial encounter: Secondary | ICD-10-CM | POA: Insufficient documentation

## 2020-09-05 DIAGNOSIS — M5432 Sciatica, left side: Secondary | ICD-10-CM

## 2020-09-05 DIAGNOSIS — Y939 Activity, unspecified: Secondary | ICD-10-CM | POA: Insufficient documentation

## 2020-09-05 DIAGNOSIS — S39012A Strain of muscle, fascia and tendon of lower back, initial encounter: Secondary | ICD-10-CM | POA: Insufficient documentation

## 2020-09-05 DIAGNOSIS — M5442 Lumbago with sciatica, left side: Secondary | ICD-10-CM | POA: Insufficient documentation

## 2020-09-05 DIAGNOSIS — Y999 Unspecified external cause status: Secondary | ICD-10-CM | POA: Insufficient documentation

## 2020-09-05 LAB — POC URINE PREG, ED: Preg Test, Ur: NEGATIVE

## 2020-09-05 MED ORDER — PREDNISONE 50 MG PO TABS
60.0000 mg | ORAL_TABLET | Freq: Once | ORAL | Status: AC
  Administered 2020-09-05: 60 mg via ORAL
  Filled 2020-09-05: qty 1

## 2020-09-05 MED ORDER — PREDNISONE 10 MG PO TABS: ORAL_TABLET | ORAL | 0 refills | Status: DC

## 2020-09-05 MED ORDER — METHOCARBAMOL 500 MG PO TABS: 500.0000 mg | ORAL_TABLET | Freq: Three times a day (TID) | ORAL | 0 refills | Status: DC

## 2020-09-05 MED ORDER — METHOCARBAMOL 500 MG PO TABS
500.0000 mg | ORAL_TABLET | Freq: Once | ORAL | Status: AC
  Administered 2020-09-05: 500 mg via ORAL
  Filled 2020-09-05: qty 1

## 2020-09-05 MED ORDER — KETOROLAC TROMETHAMINE 30 MG/ML IJ SOLN
30.0000 mg | Freq: Once | INTRAMUSCULAR | Status: AC
  Administered 2020-09-05: 30 mg via INTRAMUSCULAR
  Filled 2020-09-05: qty 1

## 2020-09-05 NOTE — ED Triage Notes (Signed)
Pt presents to ED with complaints of lower back pain after attempting to move a patient yesterday at work.

## 2020-09-05 NOTE — ED Provider Notes (Signed)
[facility] EMERGENCY DEPARTMENT Provider Note   CSN: [id number] Arrival date & time: 09/05/20  1523     History Chief Complaint  Patient presents with  . Back Pain    Morgan [name] is a 24 y.o. female with no significant past medical history presenting with right and midline low back pain with a throbbing pain radiating down her posterior left thigh to the knee which she woke with this am, after lifting a patient yesterday.  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  The resident "jerked" and the patient felt sudden onset of soreness and felt a popping sensation which was worsened today when she woke. She has taken tylenol and applied a heating pad without relief.  She denies weakness in her legs, also no urinary or fecal incontinence or retention. Denies prior back pain or injury, no fevers or chills.    HPI     Past Medical History:  Diagnosis Date  . Chlamydia 01/28/2020   Treated 01/28/20, POC___________  . Contraceptive management 04/25/2016    Patient Active Problem List   Diagnosis Date Noted  . Swelling of vulva 07/07/2020  . Vaginal itching 04/21/2020  . History of chlamydia 04/21/2020  . Vaginal odor 04/21/2020  . Vaginal discharge 04/21/2020  . Chlamydia 01/28/2020  . Screening examination for STD (sexually transmitted disease) 11/17/2019  . Dysuria 11/17/2019  . Pelvic pain 11/17/2019  . RUQ pain 11/17/2019  . Right flank pain 11/17/2019  . PID (acute pelvic inflammatory disease) 11/17/2019  . Mass of breast, right 10/09/2019  . Nipple discharge 10/09/2019  . Breast pain 10/09/2019  . Encounter for initial prescription of injectable contraceptive 02/04/2019  . Encounter for gynecological examination with Papanicolaou smear of cervix 02/04/2019  . Urinary tract infection without hematuria 04/09/2017  . BV (bacterial vaginosis) 04/09/2017  . Pyelonephritis, acute 11/07/2016  . GERD (gastroesophageal reflux  disease) 11/18/2013  . Radial styloid fracture 02/24/2013  . Scaphoid fracture of wrist 02/24/2013  . CLOSED FRACTURE OF CUBOID BONE 11/07/2009    Past Surgical History:  Procedure Laterality Date  . ROOT CANAL  03/11/2017     OB History    Gravida  0   Para  0   Term  0   Preterm  0   AB  0   Living  0     SAB  0   TAB  0   Ectopic  0   Multiple  0   Live Births              Family History  Problem Relation Age of Onset  . Diabetes Paternal Aunt   . Cancer Paternal Aunt   . Alzheimer's disease Paternal Aunt   . Diabetes Maternal Grandmother   . Cancer Paternal Grandmother   . Stroke Paternal Grandmother   . Heart attack Paternal Grandmother   . Diabetes Paternal Grandmother   . Diabetes Other     Social History   Tobacco Use  . Smoking status: Never Smoker  . Smokeless tobacco: Never Used  Vaping Use  . Vaping Use: Some days  Substance Use Topics  . Alcohol use: No  . Drug use: Yes    Types: Marijuana    Comment: last used today    Home Medications Prior to Admission medications   Medication Sig Start Date End Date Taking? Authorizing Provider  metroNIDAZOLE (FLAGYL) 500 MG tablet Take 1 tablet (500 mg total) by  mouth 2 (two) times daily. Patient not taking: Reported on 09/05/2020 07/11/20   Adline Potter, NP    Allergies    Patient has no known allergies.  Review of Systems   Review of Systems  Constitutional: Negative for fever.  Respiratory: Negative for shortness of breath.   Cardiovascular: Negative for chest pain and leg swelling.  Gastrointestinal: Negative for abdominal distention, abdominal pain and constipation.  Genitourinary: Negative for difficulty urinating, dysuria, flank pain, frequency and urgency.  Musculoskeletal: Positive for back pain. Negative for gait problem and joint swelling.  Skin: Negative for rash.  Neurological: Negative for weakness and numbness.  All other systems reviewed and are  negative.   Physical Exam Updated Vital Signs BP 117/73 (BP Location: Right Arm)   Pulse 93   Temp 98.2 F (36.8 C) (Oral)   Resp 18   Ht 5\' 2"  (1.575 m)   Wt 59 kg   LMP 09/02/2020   SpO2 95%   BMI 23.78 kg/m   Physical Exam Vitals and nursing note reviewed.  Constitutional:      Appearance: She is well-developed.  HENT:     Head: Normocephalic.  Eyes:     Conjunctiva/sclera: Conjunctivae normal.  Cardiovascular:     Rate and Rhythm: Normal rate.     Comments: Pedal pulses normal. Pulmonary:     Effort: Pulmonary effort is normal.  Abdominal:     General: Bowel sounds are normal. There is no distension.     Palpations: Abdomen is soft. There is no mass.  Musculoskeletal:     Cervical back: Normal range of motion and neck supple.     Lumbar back: Tenderness present. No swelling, edema, deformity or spasms. Positive left straight leg raise test.     Comments: ttp midline low back and right paralumbar region.  Skin:    General: Skin is warm and dry.  Neurological:     Mental Status: She is alert.     Sensory: No sensory deficit.     Motor: No tremor or atrophy.     Gait: Gait normal.     Deep Tendon Reflexes:     Reflex Scores:      Patellar reflexes are 2+ on the right side and 2+ on the left side.    Comments: No strength deficit noted in hip and knee flexor and extensor muscle groups.  Ankle flexion and extension intact.     ED Results / Procedures / Treatments   Labs (all labs ordered are listed, but only abnormal results are displayed) Labs Reviewed  POC URINE PREG, ED    EKG None  Radiology DG Lumbar Spine Complete  Result Date: 09/05/2020 CLINICAL DATA:  Lower back pain EXAM: LUMBAR SPINE - COMPLETE 4+ VIEW COMPARISON:  None. FINDINGS: There is no evidence of lumbar spine fracture. Alignment is normal. Intervertebral disc spaces are maintained. IMPRESSION: Negative. Electronically Signed   By: 09/07/2020 M.D.   On: 09/05/2020 16:23     Procedures Procedures (including critical care time)  Medications Ordered in ED Medications  ketorolac (TORADOL) 30 MG/ML injection 30 mg (has no administration in time range)  methocarbamol (ROBAXIN) tablet 500 mg (has no administration in time range)  predniSONE (DELTASONE) tablet 60 mg (has no administration in time range)    ED Course  I have reviewed the triage vital signs and the nursing notes.  Pertinent labs & imaging results that were available during my care of the patient were reviewed by me and considered  in my medical decision making (see chart for details).    MDM Rules/Calculators/A&P                          No neuro deficit on exam or by history to suggest emergent or surgical presentation. Doubt cauda equina. Afebrile, no risk factors for discitis.   Outlined worsened sx that should prompt immediate re-evaluation including distal weakness, bowel/bladder retention/incontinence.     Final Clinical Impression(s) / ED Diagnoses Final diagnoses:  Strain of lumbar region, initial encounter  Sciatica of left side    Rx / DC Orders ED Discharge Orders    None       Victoriano Lain 09/05/20 1933    Raeford Razor, MD 09/05/20 2137

## 2020-09-05 NOTE — Discharge Instructions (Signed)
Your xrays are negative for acute lumbar fracture, dislocation or hint of a disc injury.  Take your next dose of prednisone tomorrow evening and take the muscle relaxer (robaxin)  as directed.  This medicine may cause mild drowsiness  Avoid lifting,  Bending,  Twisting or any other activity that worsens your pain over the next week.  Apply an  icepack  to your lower back for 10-15 minutes every 2-4 hours (except when sleeping) for the next 2 days, after this time,  you may also intermittently add a heating pad 20 minutes 3 times daily.   You should get rechecked if your symptoms are not better over the next 10 days,  Or you develop increased pain,  weakness in your leg(s) or loss of bladder or bowel function - these are symptoms of a worsening injury.

## 2020-10-24 ENCOUNTER — Other Ambulatory Visit: Payer: Self-pay | Admitting: Women's Health

## 2020-10-24 ENCOUNTER — Other Ambulatory Visit (INDEPENDENT_AMBULATORY_CARE_PROVIDER_SITE_OTHER): Payer: BC Managed Care – PPO | Admitting: *Deleted

## 2020-10-24 DIAGNOSIS — Z113 Encounter for screening for infections with a predominantly sexual mode of transmission: Secondary | ICD-10-CM | POA: Diagnosis not present

## 2020-10-24 NOTE — Progress Notes (Addendum)
   NURSE VISIT- VAGINITIS/STD/POC  SUBJECTIVE:  Morgan [name] is a 24 y.o. G0P0000 GYN patientfemale here for a vaginal swab for STD screen.  She reports the following symptoms: none for 0 days. Denies abnormal vaginal bleeding, significant pelvic pain, fever, or UTI symptoms.  OBJECTIVE:  There were no vitals taken for this visit.  Appears well, in no apparent distress  ASSESSMENT: Vaginal swab for STD screen  PLAN: Self-collected vaginal probe for Gonorrhea, Chlamydia, Trichomonas, Bacterial Vaginosis, Yeast sent to lab (pt requested that bv and yeast be tested as well) Treatment: to be determined once results are received Follow-up as needed if symptoms persist/worsen, or new symptoms develop  Annamarie Dawley  10/24/2020 2:32 PM   Chart reviewed for nurse visit. Agree with plan of care.  Cheral Marker, [place] 10/24/2020 2:59 PM

## 2020-10-31 ENCOUNTER — Other Ambulatory Visit: Payer: Self-pay | Admitting: Women's Health

## 2020-10-31 MED ORDER — METRONIDAZOLE 500 MG PO TABS: 500.0000 mg | ORAL_TABLET | Freq: Two times a day (BID) | ORAL | 0 refills | Status: DC

## 2020-11-16 ENCOUNTER — Other Ambulatory Visit: Payer: Self-pay | Admitting: Adult Health

## 2021-01-19 ENCOUNTER — Other Ambulatory Visit (INDEPENDENT_AMBULATORY_CARE_PROVIDER_SITE_OTHER): Payer: BC Managed Care – PPO | Admitting: *Deleted

## 2021-01-19 ENCOUNTER — Other Ambulatory Visit: Payer: Self-pay

## 2021-01-19 ENCOUNTER — Other Ambulatory Visit (HOSPITAL_COMMUNITY)
Admission: RE | Admit: 2021-01-19 | Discharge: 2021-01-19 | Disposition: A | Discharge status: Home / Self Care | Payer: BC Managed Care – PPO | Source: Ambulatory Visit | Attending: Obstetrics & Gynecology | Admitting: Obstetrics & Gynecology

## 2021-01-19 DIAGNOSIS — N898 Other specified noninflammatory disorders of vagina: Secondary | ICD-10-CM | POA: Insufficient documentation

## 2021-01-19 NOTE — Progress Notes (Addendum)
   NURSE VISIT- STD  SUBJECTIVE:  Morgan [name] is a 25 y.o. G0P0000 GYN patientfemale here for a vaginal swab for STD screen.  She reports the following symptoms: vaginal discharge and odor for 1 week. Denies abnormal vaginal bleeding, significant pelvic pain, fever, or UTI symptoms.  OBJECTIVE:  There were no vitals taken for this visit.  Appears well, in no apparent distress  ASSESSMENT: Vaginal swab for STD screen  PLAN: Self-collected vaginal probe for Gonorrhea, Chlamydia, Trichomonas, Bacterial Vaginosis, Yeast sent to lab Treatment: to be determined once results are received Follow-up as needed if symptoms persist/worsen, or new symptoms develop  Malachy Mood  01/19/2021 3:53 PM   Chart reviewed for nurse visit. Agree with plan of care.  Jacklyn Shell, [place] 01/19/2021 4:18 PM

## 2021-01-23 DIAGNOSIS — L309 Dermatitis, unspecified: Secondary | ICD-10-CM | POA: Diagnosis not present

## 2021-01-23 LAB — CERVICOVAGINAL ANCILLARY ONLY
Bacterial Vaginitis (gardnerella): POSITIVE — AB
Candida Glabrata: NEGATIVE
Candida Vaginitis: POSITIVE — AB
Chlamydia: NEGATIVE
Comment: NEGATIVE
Comment: NEGATIVE
Comment: NEGATIVE
Comment: NEGATIVE
Comment: NEGATIVE
Comment: NORMAL
Neisseria Gonorrhea: NEGATIVE
Trichomonas: NEGATIVE

## 2021-01-26 ENCOUNTER — Other Ambulatory Visit: Payer: Self-pay | Admitting: Advanced Practice Midwife

## 2021-01-26 MED ORDER — METRONIDAZOLE 500 MG PO TABS: 500.0000 mg | ORAL_TABLET | Freq: Two times a day (BID) | ORAL | 0 refills | Status: DC

## 2021-01-26 MED ORDER — FLUCONAZOLE 150 MG PO TABS: 150.0000 mg | ORAL_TABLET | Freq: Once | ORAL | 0 refills | Status: AC

## 2021-01-26 NOTE — Progress Notes (Signed)
Flagyl and Diflucan for + swab.

## 2021-02-01 ENCOUNTER — Ambulatory Visit
Admission: EM | Admit: 2021-02-01 | Discharge: 2021-02-01 | Disposition: A | Discharge status: Home / Self Care | Payer: BC Managed Care – PPO | Attending: Family Medicine | Admitting: Family Medicine

## 2021-02-01 ENCOUNTER — Other Ambulatory Visit: Payer: Self-pay

## 2021-02-01 DIAGNOSIS — J029 Acute pharyngitis, unspecified: Secondary | ICD-10-CM

## 2021-02-01 DIAGNOSIS — R519 Headache, unspecified: Secondary | ICD-10-CM

## 2021-02-01 DIAGNOSIS — R6889 Other general symptoms and signs: Secondary | ICD-10-CM | POA: Diagnosis not present

## 2021-02-01 DIAGNOSIS — H04203 Unspecified epiphora, bilateral lacrimal glands: Secondary | ICD-10-CM | POA: Diagnosis not present

## 2021-02-01 DIAGNOSIS — J069 Acute upper respiratory infection, unspecified: Secondary | ICD-10-CM

## 2021-02-01 MED ORDER — PSEUDOEPHEDRINE HCL ER 120 MG PO TB12: 120.0000 mg | ORAL_TABLET | Freq: Two times a day (BID) | ORAL | 0 refills | Status: DC

## 2021-02-01 MED ORDER — PROMETHAZINE-DM 6.25-15 MG/5ML PO SYRP: 5.0000 mL | ORAL_SOLUTION | Freq: Four times a day (QID) | ORAL | 0 refills | Status: DC | PRN

## 2021-02-01 NOTE — ED Provider Notes (Signed)
[facility]   [id number] 02/01/21 Arrival Time: 1247   CC: COVID symptoms  SUBJECTIVE: History from: patient.  Morgan [name] is a 25 y.o. female who presents with nasal congestion, cough, watery eyes, sore throat and headaches x 4 days. Denies sick exposure to COVID, flu or strep. Denies recent travel. Has negative history of Covid. Has not completed Covid vaccines. Has taken Advil with not relief. There are no aggravating or alleviating factors. Denies previous symptoms in the past. Denies fever, chills, sinus pain, SOB, wheezing, chest pain, nausea, changes in bowel or bladder habits.    ROS: As per HPI.  All other pertinent ROS negative.     Past Medical History:  Diagnosis Date  . Chlamydia 01/28/2020   Treated 01/28/20, POC___________  . Contraceptive management 04/25/2016   Past Surgical History:  Procedure Laterality Date  . ROOT CANAL  03/11/2017   No Known Allergies No current facility-administered medications on file prior to encounter.   Current Outpatient Medications on File Prior to Encounter  Medication Sig Dispense Refill  . metroNIDAZOLE (FLAGYL) 500 MG tablet Take 1 tablet (500 mg total) by mouth 2 (two) times daily. 14 tablet 0   Social History   Socioeconomic History  . Marital status: Single    Spouse name: Not on file  . Number of children: Not on file  . Years of education: Not on file  . Highest education level: Not on file  Occupational History  . Not on file  Tobacco Use  . Smoking status: Never Smoker  . Smokeless tobacco: Never Used  Vaping Use  . Vaping Use: Some days  Substance and Sexual Activity  . Alcohol use: No  . Drug use: Yes    Types: Marijuana    Comment: last used today  . Sexual activity: Yes    Birth control/protection: None  Other Topics Concern  . Not on file  Social History Narrative  . Not on file   Social Determinants of Health   Financial Resource Strain: Not on file  Food Insecurity: Not on file   Transportation Needs: Not on file  Physical Activity: Not on file  Stress: Not on file  Social Connections: Not on file  Intimate Partner Violence: Not on file   Family History  Problem Relation Age of Onset  . Diabetes Paternal Aunt   . Cancer Paternal Aunt   . Alzheimer's disease Paternal Aunt   . Diabetes Maternal Grandmother   . Cancer Paternal Grandmother   . Stroke Paternal Grandmother   . Heart attack Paternal Grandmother   . Diabetes Paternal Grandmother   . Diabetes Other     OBJECTIVE:  Vitals:   02/01/21 1329  BP: 109/71  Pulse: 66  Resp: 18  Temp: 98.2 F (36.8 C)  SpO2: 98%     General appearance: alert; appears fatigued, but nontoxic; speaking in full sentences and tolerating own secretions HEENT: NCAT; Ears: EACs clear, TMs pearly gray; Eyes: PERRL.  EOM grossly intact, bilateral eyes draining clear Sinuses: nontender; Nose: nares patent with clear rhinorrhea, Throat: oropharynx erythematous, cobblestoning present, tonsils non erythematous or enlarged, uvula midline  Neck: supple with LAD Lungs: unlabored respirations, symmetrical air entry; cough: mild; no respiratory distress; CTAB  Heart: regular rate and rhythm.  Radial pulses 2+ symmetrical bilaterally Skin: warm and dry Psychological: alert and cooperative; normal mood and affect  LABS:  No results found for this or any previous visit (from the past 24 hour(s)).   ASSESSMENT & PLAN:  1. Viral URI with cough   2. Watery eyes   3. Sore throat   4. Nonintractable headache, unspecified chronicity pattern, unspecified headache type     Meds ordered this encounter  Medications  . pseudoephedrine (SUDAFED) 120 MG 12 hr tablet    Sig: Take 1 tablet (120 mg total) by mouth 2 (two) times daily.    Dispense:  10 tablet    Refill:  0    Order Specific Question:   Supervising Provider    Answer:   Merrilee Jansky [id number]  . promethazine-dextromethorphan (PROMETHAZINE-DM) 6.25-15 MG/5ML syrup     Sig: Take 5 mLs by mouth 4 (four) times daily as needed for cough.    Dispense:  118 mL    Refill:  0    Order Specific Question:   Supervising Provider    Answer:   Merrilee Jansky [[id number]]    Sudafed prescribed Promethazine cough syrup prescribed Sedation precautions given Continue supportive care at home COVID and flu testing ordered.  It will take between 2-3 days for test results. Someone will contact you regarding abnormal results.   Work note provided Patient should remain in quarantine until they have received Covid results.  If negative you may resume normal activities (go back to work/school) while practicing hand hygiene, social distance, and mask wearing.  If positive, patient should remain in quarantine for at least 5 days from symptom onset AND greater than 72 hours after symptoms resolution (absence of fever without the use of fever-reducing medication and improvement in respiratory symptoms), whichever is longer Get plenty of rest and push fluids Use OTC zyrtec for nasal congestion, runny nose, and/or sore throat Use OTC flonase for nasal congestion and runny nose Use medications daily for symptom relief Use OTC medications like ibuprofen or tylenol as needed fever or pain Call or go to the ED if you have any new or worsening symptoms such as fever, worsening cough, shortness of breath, chest tightness, chest pain, turning blue, changes in mental status.  Reviewed expectations re: course of current medical issues. Questions answered. Outlined signs and symptoms indicating need for more acute intervention. Patient verbalized understanding. After Visit Summary given.         Moshe Cipro, NP 02/01/21 1356

## 2021-02-01 NOTE — Discharge Instructions (Addendum)
Prescribed Sudafed  I have sent in cough syrup for you to take. This medication can make you sleepy. Do not drive while taking this medication.  Your COVID test is pending.  You should self quarantine until the test result is back.    Take Tylenol or ibuprofen as needed for fever or discomfort.  Rest and keep yourself hydrated.    Follow-up with your primary care provider if your symptoms are not improving.

## 2021-02-01 NOTE — ED Triage Notes (Signed)
Pt presents with nasal congestion, sore throat and irritated eyes that began Friday

## 2021-02-02 ENCOUNTER — Other Ambulatory Visit: Payer: Self-pay | Admitting: Advanced Practice Midwife

## 2021-02-02 LAB — SARS-COV-2, NAA 2 DAY TAT

## 2021-02-02 LAB — NOVEL CORONAVIRUS, NAA: SARS-CoV-2, NAA: NOT DETECTED

## 2021-02-02 MED ORDER — MEDROXYPROGESTERONE ACETATE 150 MG/ML IM SUSP: 150.0000 mg | INTRAMUSCULAR | 3 refills | Status: DC

## 2021-02-02 NOTE — Progress Notes (Unsigned)
Depo rx sent

## 2021-02-02 NOTE — Progress Notes (Signed)
Depo rx to pharmacy per request

## 2021-02-13 ENCOUNTER — Ambulatory Visit: Payer: BC Managed Care – PPO

## 2021-02-14 ENCOUNTER — Ambulatory Visit: Payer: BC Managed Care – PPO

## 2021-03-02 DIAGNOSIS — F4320 Adjustment disorder, unspecified: Secondary | ICD-10-CM | POA: Diagnosis not present

## 2021-03-08 ENCOUNTER — Other Ambulatory Visit: Payer: BC Managed Care – PPO | Admitting: Adult Health

## 2021-03-09 DIAGNOSIS — F4320 Adjustment disorder, unspecified: Secondary | ICD-10-CM | POA: Diagnosis not present

## 2021-03-20 ENCOUNTER — Other Ambulatory Visit: Payer: Self-pay

## 2021-03-20 ENCOUNTER — Encounter: Payer: Self-pay | Admitting: Emergency Medicine

## 2021-03-20 ENCOUNTER — Ambulatory Visit
Admission: EM | Admit: 2021-03-20 | Discharge: 2021-03-20 | Disposition: A | Discharge status: Home / Self Care | Payer: BC Managed Care – PPO | Attending: Family Medicine | Admitting: Family Medicine

## 2021-03-20 DIAGNOSIS — R1111 Vomiting without nausea: Secondary | ICD-10-CM | POA: Diagnosis not present

## 2021-03-20 DIAGNOSIS — Z79899 Other long term (current) drug therapy: Secondary | ICD-10-CM | POA: Insufficient documentation

## 2021-03-20 DIAGNOSIS — Z113 Encounter for screening for infections with a predominantly sexual mode of transmission: Secondary | ICD-10-CM | POA: Insufficient documentation

## 2021-03-20 DIAGNOSIS — N760 Acute vaginitis: Secondary | ICD-10-CM | POA: Insufficient documentation

## 2021-03-20 LAB — POCT URINALYSIS DIP (MANUAL ENTRY)
Bilirubin, UA: NEGATIVE
Glucose, UA: NEGATIVE mg/dL
Ketones, POC UA: NEGATIVE mg/dL
Leukocytes, UA: NEGATIVE
Nitrite, UA: NEGATIVE
Protein Ur, POC: NEGATIVE mg/dL
Spec Grav, UA: >=1.03 — AB (ref 1.010–1.025)
Urobilinogen, UA: 1 E.U./dL
pH, UA: 5.5 (ref 5.0–8.0)

## 2021-03-20 LAB — POCT URINE PREGNANCY: Preg Test, Ur: NEGATIVE

## 2021-03-20 MED ORDER — METRONIDAZOLE 0.75 % VA GEL: 1.0000 | Freq: Every day | VAGINAL | 0 refills | Status: AC

## 2021-03-20 NOTE — ED Triage Notes (Signed)
vomited x 1 yesterday.  Having fatigue since she vomited yesterday.  Also concerned she has BV.

## 2021-03-20 NOTE — Discharge Instructions (Addendum)
Vaginal cytology will result within 3 days or less. If any additional treatment is warranted we will notify you via MyChart.

## 2021-03-20 NOTE — ED Provider Notes (Signed)
[facility]    CSN: [id number] Arrival date & time: 03/20/21  1710      History   Chief Complaint Chief Complaint  Patient presents with  . Emesis  . Vomiting    HPI Morgan [name] is a 25 y.o. female.   HPI  Vomited x 1 episode and did not go to work today.  Had experienced looser than normal stools a few days prior to emesis. No loose stools or vomiting today.  STI testing: Concern that boyfriend has been unfaithful -would like STI testing. Concern with changes to vaginal discharge -thicken and odor present. history of recurrent BV. Would like treatment for BV. Last menstrual period normal.    Past Medical History:  Diagnosis Date  . Chlamydia 01/28/2020   Treated 01/28/20, POC___________  . Contraceptive management 04/25/2016    Patient Active Problem List   Diagnosis Date Noted  . Swelling of vulva 07/07/2020  . Vaginal itching 04/21/2020  . History of chlamydia 04/21/2020  . Vaginal odor 04/21/2020  . Vaginal discharge 04/21/2020  . Chlamydia 01/28/2020  . Screening examination for STD (sexually transmitted disease) 11/17/2019  . Dysuria 11/17/2019  . Pelvic pain 11/17/2019  . RUQ pain 11/17/2019  . Right flank pain 11/17/2019  . PID (acute pelvic inflammatory disease) 11/17/2019  . Mass of breast, right 10/09/2019  . Nipple discharge 10/09/2019  . Breast pain 10/09/2019  . Encounter for initial prescription of injectable contraceptive 02/04/2019  . Encounter for gynecological examination with Papanicolaou smear of cervix 02/04/2019  . Urinary tract infection without hematuria 04/09/2017  . BV (bacterial vaginosis) 04/09/2017  . Pyelonephritis, acute 11/07/2016  . GERD (gastroesophageal reflux disease) 11/18/2013  . Radial styloid fracture 02/24/2013  . Scaphoid fracture of wrist 02/24/2013  . CLOSED FRACTURE OF CUBOID BONE 11/07/2009    Past Surgical History:  Procedure Laterality Date  . ROOT CANAL  03/11/2017    OB History    Gravida   0   Para  0   Term  0   Preterm  0   AB  0   Living  0     SAB  0   IAB  0   Ectopic  0   Multiple  0   Live Births               Home Medications    Prior to Admission medications   Medication Sig Start Date End Date Taking? Authorizing Provider  medroxyPROGESTERone (DEPO-PROVERA) 150 MG/ML injection Inject 1 mL (150 mg total) into the muscle every 3 (three) months. 02/02/21   Cresenzo-Dishmon, Scarlette Calico, CNM  metroNIDAZOLE (FLAGYL) 500 MG tablet Take 1 tablet (500 mg total) by mouth 2 (two) times daily. 01/26/21   Cresenzo-Dishmon, Scarlette Calico, CNM  promethazine-dextromethorphan (PROMETHAZINE-DM) 6.25-15 MG/5ML syrup Take 5 mLs by mouth 4 (four) times daily as needed for cough. 02/01/21   Moshe Cipro, NP  pseudoephedrine (SUDAFED) 120 MG 12 hr tablet Take 1 tablet (120 mg total) by mouth 2 (two) times daily. 02/01/21   Moshe Cipro, NP    Family History Family History  Problem Relation Age of Onset  . Diabetes Paternal Aunt   . Cancer Paternal Aunt   . Alzheimer's disease Paternal Aunt   . Diabetes Maternal Grandmother   . Cancer Paternal Grandmother   . Stroke Paternal Grandmother   . Heart attack Paternal Grandmother   . Diabetes Paternal Grandmother   . Diabetes Other     Social History Social History   Tobacco  Use  . Smoking status: Never Smoker  . Smokeless tobacco: Never Used  Vaping Use  . Vaping Use: Some days  Substance Use Topics  . Alcohol use: No  . Drug use: Yes    Types: Marijuana    Comment: last used today     Allergies   Patient has no known allergies.   Review of Systems Review of Systems Pertinent negatives listed in HPI  Physical Exam Triage Vital Signs ED Triage Vitals  Enc Vitals Group     BP 03/20/21 1757 105/64     Pulse Rate 03/20/21 1757 73     Resp 03/20/21 1757 17     Temp 03/20/21 1757 98.9 F (37.2 C)     Temp Source 03/20/21 1757 Oral     SpO2 03/20/21 1757 98 %     Weight --      Height --       Head Circumference --      Peak Flow --      Pain Score 03/20/21 1756 0     Pain Loc --      Pain Edu? --      Excl. in GC? --    No data found.  Updated Vital Signs BP 105/64 (BP Location: Right Arm)   Pulse 73   Temp 98.9 F (37.2 C) (Oral)   Resp 17   LMP 03/02/2021   SpO2 98%   Visual Acuity Right Eye Distance:   Left Eye Distance:   Bilateral Distance:    Right Eye Near:   Left Eye Near:    Bilateral Near:     Physical Exam General appearance: alert, well developed, well nourished, cooperative Head: Normocephalic, without obvious abnormality, atraumatic Respiratory: Respirations even and unlabored, normal respiratory rate Heart: rate and rhythm normal. No gallop or murmurs noted on exam  Abdomen: BS +, no distention, no rebound tenderness, or no mass Extremities: No gross deformities Skin: Skin color, texture, turgor normal. No rashes seen  Psych: Appropriate mood and affect.  UC Treatments / Results  Labs (all labs ordered are listed, but only abnormal results are displayed) Labs Reviewed  POCT URINALYSIS DIP (MANUAL ENTRY)  POCT URINE PREGNANCY  CERVICOVAGINAL ANCILLARY ONLY    EKG   Radiology No results found.  Procedures Procedures (including critical care time)  Medications Ordered in UC Medications - No data to display  Initial Impression / Assessment and Plan / UC Course  I have reviewed the triage vital signs and the nursing notes.  Pertinent labs & imaging results that were available during my care of the patient were reviewed by me and considered in my medical decision making (see chart for details).    Vomiting, resolved. No further episodes. Vaginitis, vaginal cytology pending. Treating for BV with metrogel. Will notify patient if any additional treatment warranted. Final Clinical Impressions(s) / UC Diagnoses   Final diagnoses:  Vomiting without nausea, intractability of vomiting not specified, unspecified vomiting type   Vaginitis and vulvovaginitis     Discharge Instructions     Vaginal cytology will result within 3 days or less. If any additional treatment is warranted we will notify you via MyChart.    ED Prescriptions    Medication Sig Dispense Auth. Provider   metroNIDAZOLE (METROGEL VAGINAL) 0.75 % vaginal gel Place 1 Applicatorful vaginally at bedtime for 5 days. 50 g Bing Neighbors, FNP     PDMP not reviewed this encounter.   Bing Neighbors, FNP 03/20/21 [phone]

## 2021-03-21 ENCOUNTER — Telehealth (HOSPITAL_COMMUNITY): Payer: Self-pay | Admitting: Emergency Medicine

## 2021-03-21 LAB — CERVICOVAGINAL ANCILLARY ONLY
Bacterial Vaginitis (gardnerella): POSITIVE — AB
Candida Glabrata: NEGATIVE
Candida Vaginitis: POSITIVE — AB
Chlamydia: NEGATIVE
Comment: NEGATIVE
Comment: NEGATIVE
Comment: NEGATIVE
Comment: NEGATIVE
Comment: NEGATIVE
Comment: NORMAL
Neisseria Gonorrhea: NEGATIVE
Trichomonas: NEGATIVE

## 2021-03-21 MED ORDER — FLUCONAZOLE 150 MG PO TABS: 150.0000 mg | ORAL_TABLET | Freq: Once | ORAL | 0 refills | Status: AC

## 2021-03-23 DIAGNOSIS — F4320 Adjustment disorder, unspecified: Secondary | ICD-10-CM | POA: Diagnosis not present

## 2021-03-28 DIAGNOSIS — B349 Viral infection, unspecified: Secondary | ICD-10-CM | POA: Diagnosis not present

## 2021-03-28 DIAGNOSIS — K529 Noninfective gastroenteritis and colitis, unspecified: Secondary | ICD-10-CM | POA: Diagnosis not present

## 2021-03-31 DIAGNOSIS — B349 Viral infection, unspecified: Secondary | ICD-10-CM | POA: Diagnosis not present

## 2021-03-31 DIAGNOSIS — J019 Acute sinusitis, unspecified: Secondary | ICD-10-CM | POA: Diagnosis not present

## 2021-04-05 ENCOUNTER — Encounter: Payer: Self-pay | Admitting: Emergency Medicine

## 2021-04-05 ENCOUNTER — Other Ambulatory Visit: Payer: Self-pay

## 2021-04-05 ENCOUNTER — Ambulatory Visit
Admission: EM | Admit: 2021-04-05 | Discharge: 2021-04-05 | Disposition: A | Discharge status: Home / Self Care | Payer: BC Managed Care – PPO | Attending: Internal Medicine | Admitting: Internal Medicine

## 2021-04-05 DIAGNOSIS — R102 Pelvic and perineal pain: Secondary | ICD-10-CM | POA: Diagnosis not present

## 2021-04-05 DIAGNOSIS — R1030 Lower abdominal pain, unspecified: Secondary | ICD-10-CM | POA: Diagnosis not present

## 2021-04-05 DIAGNOSIS — R197 Diarrhea, unspecified: Secondary | ICD-10-CM | POA: Diagnosis not present

## 2021-04-05 LAB — POCT URINALYSIS DIP (MANUAL ENTRY)
Bilirubin, UA: NEGATIVE
Blood, UA: NEGATIVE
Glucose, UA: NEGATIVE mg/dL
Ketones, POC UA: NEGATIVE mg/dL
Leukocytes, UA: NEGATIVE
Nitrite, UA: NEGATIVE
Protein Ur, POC: NEGATIVE mg/dL
Spec Grav, UA: >=1.03 — AB (ref 1.010–1.025)
Urobilinogen, UA: 0.2 E.U./dL
pH, UA: 5.5 (ref 5.0–8.0)

## 2021-04-05 MED ORDER — DICYCLOMINE HCL 10 MG PO CAPS: 10.0000 mg | ORAL_CAPSULE | Freq: Three times a day (TID) | ORAL | 0 refills | Status: DC

## 2021-04-05 NOTE — ED Triage Notes (Signed)
States she had a virus about 2 weeks ago.  States she is having ABD pain especially when she urinates and has a bowel movement.

## 2021-04-05 NOTE — ED Provider Notes (Signed)
[facility]    CSN: [id number] Arrival date & time: 04/05/21  9528      History   Chief Complaint No chief complaint on file.   HPI Morgan [name] is a 25 y.o. female who presents with lower abdominal pain right before she will have diarrhea and also when she voids.  She thought it was related to GE she had 2 weeks ago and the vomiting resolved, but not the diarrhea. Has diarrhea after each time she eats or drinks. If she does not eat, her abd pain is fine. She was also treated for BV and candida 3/28 and completed treatment, but the abdominal pain is not better. Denies blood in the stools.     Past Medical History:  Diagnosis Date  . Chlamydia 01/28/2020   Treated 01/28/20, POC___________  . Contraceptive management 04/25/2016    Patient Active Problem List   Diagnosis Date Noted  . Swelling of vulva 07/07/2020  . Vaginal itching 04/21/2020  . History of chlamydia 04/21/2020  . Vaginal odor 04/21/2020  . Vaginal discharge 04/21/2020  . Chlamydia 01/28/2020  . Screening examination for STD (sexually transmitted disease) 11/17/2019  . Dysuria 11/17/2019  . Pelvic pain 11/17/2019  . RUQ pain 11/17/2019  . Right flank pain 11/17/2019  . PID (acute pelvic inflammatory disease) 11/17/2019  . Mass of breast, right 10/09/2019  . Nipple discharge 10/09/2019  . Breast pain 10/09/2019  . Encounter for initial prescription of injectable contraceptive 02/04/2019  . Encounter for gynecological examination with Papanicolaou smear of cervix 02/04/2019  . Urinary tract infection without hematuria 04/09/2017  . BV (bacterial vaginosis) 04/09/2017  . Pyelonephritis, acute 11/07/2016  . GERD (gastroesophageal reflux disease) 11/18/2013  . Radial styloid fracture 02/24/2013  . Scaphoid fracture of wrist 02/24/2013  . CLOSED FRACTURE OF CUBOID BONE 11/07/2009    Past Surgical History:  Procedure Laterality Date  . ROOT CANAL  03/11/2017    OB History    Gravida  0    Para  0   Term  0   Preterm  0   AB  0   Living  0     SAB  0   IAB  0   Ectopic  0   Multiple  0   Live Births               Home Medications    Prior to Admission medications   Medication Sig Start Date End Date Taking? Authorizing Provider  dicyclomine (BENTYL) 10 MG capsule Take 1 capsule (10 mg total) by mouth 4 (four) times daily -  before meals and at bedtime. Prn cramps 04/05/21  Yes Rodriguez-Southworth, Nettie Elm, PA-C  medroxyPROGESTERone (DEPO-PROVERA) 150 MG/ML injection Inject 1 mL (150 mg total) into the muscle every 3 (three) months. 02/02/21   Jacklyn Shell, CNM    Family History Family History  Problem Relation Age of Onset  . Diabetes Paternal Aunt   . Cancer Paternal Aunt   . Alzheimer's disease Paternal Aunt   . Diabetes Maternal Grandmother   . Cancer Paternal Grandmother   . Stroke Paternal Grandmother   . Heart attack Paternal Grandmother   . Diabetes Paternal Grandmother   . Diabetes Other     Social History Social History   Tobacco Use  . Smoking status: Never Smoker  . Smokeless tobacco: Never Used  Vaping Use  . Vaping Use: Some days  Substance Use Topics  . Alcohol use: No  . Drug use: Yes  Types: Marijuana    Comment: last used today     Allergies   Patient has no known allergies.   Review of Systems Review of Systems  Constitutional: Negative for fever.  HENT: Negative for congestion.   Respiratory: Negative for cough.   Gastrointestinal: Positive for abdominal pain, diarrhea, nausea and vomiting.  Genitourinary: Positive for pelvic pain. Negative for dysuria and frequency.  Musculoskeletal: Negative for gait problem and myalgias.  Skin: Negative for rash.  Neurological: Negative for headaches.     Physical Exam Triage Vital Signs ED Triage Vitals  Enc Vitals Group     BP 04/05/21 0947 109/71     Pulse Rate 04/05/21 0947 76     Resp 04/05/21 0947 18     Temp 04/05/21 0947 99.1 F (37.3 C)      Temp Source 04/05/21 0947 Oral     SpO2 04/05/21 0947 98 %     Weight --      Height --      Head Circumference --      Peak Flow --      Pain Score 04/05/21 0945 5     Pain Loc --      Pain Edu? --      Excl. in GC? --    No data found.  Updated Vital Signs BP 109/71 (BP Location: Right Arm)   Pulse 76   Temp 99.1 F (37.3 C) (Oral)   Resp 18   LMP 03/02/2021   SpO2 98%   Visual Acuity Right Eye Distance:   Left Eye Distance:   Bilateral Distance:    Right Eye Near:   Left Eye Near:    Bilateral Near:     Physical Exam Vitals and nursing note reviewed.  Constitutional:      General: She is not in acute distress.    Appearance: She is not toxic-appearing.  HENT:     Head: Normocephalic.     Right Ear: External ear normal.     Left Ear: External ear normal.  Eyes:     General: No scleral icterus.    Conjunctiva/sclera: Conjunctivae normal.  Pulmonary:     Effort: Pulmonary effort is normal.  Abdominal:     General: Abdomen is flat. Bowel sounds are normal.     Palpations: Abdomen is soft. There is no mass.     Tenderness: There is no abdominal tenderness. There is no guarding or rebound.  Musculoskeletal:        General: Normal range of motion.     Cervical back: Neck supple.  Skin:    General: Skin is warm and dry.     Findings: No rash.  Neurological:     Mental Status: She is alert and oriented to person, place, and time.     Gait: Gait normal.  Psychiatric:        Mood and Affect: Mood normal.        Behavior: Behavior normal.        Thought Content: Thought content normal.        Judgment: Judgment normal.      UC Treatments / Results  Labs (all labs ordered are listed, but only abnormal results are displayed) Labs Reviewed  POCT URINALYSIS DIP (MANUAL ENTRY) - Abnormal; Notable for the following components:      Result Value   Spec Grav, UA >=1.030 (*)    All other components within normal limits  CERVICOVAGINAL ANCILLARY ONLY     EKG  Radiology No results found.  Procedures Procedures (including critical care time)  Medications Ordered in UC Medications - No data to display  Initial Impression / Assessment and Plan / UC Course  I have reviewed the triage vital signs and the nursing notes. Pertinent labs  results that were available during my care of the patient were reviewed by me and considered in my medical decision making (see chart for details). Unresolved diarrhea. I ordered GI panel and will have her try Bentyl prn cramps. See instructions Final Clinical Impressions(s) / UC Diagnoses   Final diagnoses:  Diarrhea, unspecified type     Discharge Instructions     It's OK to take Pepto as needed Follow up with gastroenterology if the stool test are negative and you continue with symptoms     ED Prescriptions    Medication Sig Dispense Auth. Provider   dicyclomine (BENTYL) 10 MG capsule Take 1 capsule (10 mg total) by mouth 4 (four) times daily -  before meals and at bedtime. Prn cramps 15 capsule Rodriguez-Southworth, Nettie Elm, PA-C     PDMP not reviewed this encounter.   Garey Ham, PA-C 04/05/21 2000

## 2021-04-05 NOTE — Discharge Instructions (Signed)
It's OK to take Pepto as needed Follow up with gastroenterology if the stool test are negative and you continue with symptoms

## 2021-04-06 ENCOUNTER — Telehealth (HOSPITAL_COMMUNITY): Payer: Self-pay | Admitting: Emergency Medicine

## 2021-04-06 DIAGNOSIS — F4320 Adjustment disorder, unspecified: Secondary | ICD-10-CM | POA: Diagnosis not present

## 2021-04-06 LAB — CERVICOVAGINAL ANCILLARY ONLY
Bacterial Vaginitis (gardnerella): POSITIVE — AB
Candida Glabrata: NEGATIVE
Candida Vaginitis: NEGATIVE
Chlamydia: NEGATIVE
Comment: NEGATIVE
Comment: NEGATIVE
Comment: NEGATIVE
Comment: NEGATIVE
Comment: NEGATIVE
Comment: NORMAL
Neisseria Gonorrhea: NEGATIVE
Trichomonas: NEGATIVE

## 2021-04-06 MED ORDER — METRONIDAZOLE 500 MG PO TABS: 500.0000 mg | ORAL_TABLET | Freq: Two times a day (BID) | ORAL | 0 refills | Status: DC

## 2021-04-27 DIAGNOSIS — F4320 Adjustment disorder, unspecified: Secondary | ICD-10-CM | POA: Diagnosis not present

## 2021-05-05 ENCOUNTER — Ambulatory Visit
Admission: EM | Admit: 2021-05-05 | Discharge: 2021-05-05 | Disposition: A | Discharge status: Home / Self Care | Payer: BC Managed Care – PPO | Attending: Emergency Medicine | Admitting: Emergency Medicine

## 2021-05-05 ENCOUNTER — Other Ambulatory Visit: Payer: Self-pay

## 2021-05-05 DIAGNOSIS — Z113 Encounter for screening for infections with a predominantly sexual mode of transmission: Secondary | ICD-10-CM | POA: Diagnosis not present

## 2021-05-05 DIAGNOSIS — B9689 Other specified bacterial agents as the cause of diseases classified elsewhere: Secondary | ICD-10-CM | POA: Diagnosis not present

## 2021-05-05 DIAGNOSIS — B373 Candidiasis of vulva and vagina: Secondary | ICD-10-CM | POA: Diagnosis not present

## 2021-05-05 DIAGNOSIS — N760 Acute vaginitis: Secondary | ICD-10-CM | POA: Insufficient documentation

## 2021-05-05 DIAGNOSIS — R109 Unspecified abdominal pain: Secondary | ICD-10-CM | POA: Diagnosis not present

## 2021-05-05 NOTE — ED Notes (Signed)
Pt going to ER urine culture will not be sent off

## 2021-05-05 NOTE — ED Triage Notes (Signed)
Lower abd pain that started last night.  Denies any burning with with urination.  Also having sinus congestion

## 2021-05-06 ENCOUNTER — Encounter (HOSPITAL_COMMUNITY): Payer: Self-pay | Admitting: *Deleted

## 2021-05-06 ENCOUNTER — Emergency Department (HOSPITAL_COMMUNITY)
Admission: EM | Admit: 2021-05-06 | Discharge: 2021-05-06 | Disposition: A | Discharge status: Home / Self Care | Payer: BC Managed Care – PPO | Attending: Emergency Medicine | Admitting: Emergency Medicine

## 2021-05-06 DIAGNOSIS — R1030 Lower abdominal pain, unspecified: Secondary | ICD-10-CM | POA: Diagnosis not present

## 2021-05-06 DIAGNOSIS — K219 Gastro-esophageal reflux disease without esophagitis: Secondary | ICD-10-CM | POA: Insufficient documentation

## 2021-05-06 LAB — URINALYSIS, ROUTINE W REFLEX MICROSCOPIC
Bilirubin Urine: NEGATIVE
Glucose, UA: NEGATIVE mg/dL
Hgb urine dipstick: NEGATIVE
Ketones, ur: NEGATIVE mg/dL
Leukocytes,Ua: NEGATIVE
Nitrite: NEGATIVE
Protein, ur: NEGATIVE mg/dL
Specific Gravity, Urine: 1.01 (ref 1.005–1.030)
pH: 6 (ref 5.0–8.0)

## 2021-05-06 LAB — PREGNANCY, URINE: Preg Test, Ur: NEGATIVE

## 2021-05-06 NOTE — ED Notes (Signed)
MD at bedside. 

## 2021-05-06 NOTE — ED Notes (Signed)
MD has been to bedside to discuss present test results and desired additional testing and POC. Per MD, pt is refusing additional testing and chooses to leave AMA. Pt signed AMA e-form. Asked for a work note to cover when she was seen at [facility] yesterday, and then today. Explained we could provide a work note showing she was at the ER today but not for another facility yesterday. Pt left ER before waiting for work note.

## 2021-05-06 NOTE — ED Notes (Signed)
Pt left before allowing repeat VS to be obtained.

## 2021-05-06 NOTE — ED Triage Notes (Signed)
Abdominal pain x 3 days

## 2021-05-06 NOTE — ED Notes (Signed)
Pt ambulated to and from bathroom with steady gait. Provided urine specimen.

## 2021-05-06 NOTE — ED Provider Notes (Signed)
[facility] EMERGENCY DEPARTMENT Provider Note   CSN: [id number] Arrival date & time: 05/06/21  1028     History Chief Complaint  Patient presents with  . Abdominal Pain    Morgan [name] is a 25 y.o. female.  Patient complains of suprapubic abdominal pain no vomiting or diarrhea  The history is provided by the patient and medical records.  Abdominal Pain Pain location:  Suprapubic Pain quality: aching   Pain radiates to:  Does not radiate Pain severity:  Mild Onset quality:  Sudden Timing:  Constant Progression:  Waxing and waning Chronicity:  New Context: not alcohol use   Relieved by:  Nothing Worsened by:  Nothing Associated symptoms: no chest pain, no cough, no diarrhea, no fatigue and no hematuria        Past Medical History:  Diagnosis Date  . Chlamydia 01/28/2020   Treated 01/28/20, POC___________  . Contraceptive management 04/25/2016    Patient Active Problem List   Diagnosis Date Noted  . Swelling of vulva 07/07/2020  . Vaginal itching 04/21/2020  . History of chlamydia 04/21/2020  . Vaginal odor 04/21/2020  . Vaginal discharge 04/21/2020  . Chlamydia 01/28/2020  . Screening examination for STD (sexually transmitted disease) 11/17/2019  . Dysuria 11/17/2019  . Pelvic pain 11/17/2019  . RUQ pain 11/17/2019  . Right flank pain 11/17/2019  . PID (acute pelvic inflammatory disease) 11/17/2019  . Mass of breast, right 10/09/2019  . Nipple discharge 10/09/2019  . Breast pain 10/09/2019  . Encounter for initial prescription of injectable contraceptive 02/04/2019  . Encounter for gynecological examination with Papanicolaou smear of cervix 02/04/2019  . Urinary tract infection without hematuria 04/09/2017  . BV (bacterial vaginosis) 04/09/2017  . Pyelonephritis, acute 11/07/2016  . GERD (gastroesophageal reflux disease) 11/18/2013  . Radial styloid fracture 02/24/2013  . Scaphoid fracture of wrist 02/24/2013  . CLOSED FRACTURE OF CUBOID BONE 11/07/2009     Past Surgical History:  Procedure Laterality Date  . ROOT CANAL  03/11/2017     OB History    Gravida  0   Para  0   Term  0   Preterm  0   AB  0   Living  0     SAB  0   IAB  0   Ectopic  0   Multiple  0   Live Births              Family History  Problem Relation Age of Onset  . Diabetes Paternal Aunt   . Cancer Paternal Aunt   . Alzheimer's disease Paternal Aunt   . Diabetes Maternal Grandmother   . Cancer Paternal Grandmother   . Stroke Paternal Grandmother   . Heart attack Paternal Grandmother   . Diabetes Paternal Grandmother   . Diabetes Other     Social History   Tobacco Use  . Smoking status: Never Smoker  . Smokeless tobacco: Never Used  Vaping Use  . Vaping Use: Some days  Substance Use Topics  . Alcohol use: No  . Drug use: Yes    Types: Marijuana    Comment: last used today    Home Medications Prior to Admission medications   Medication Sig Start Date End Date Taking? Authorizing Provider  dicyclomine (BENTYL) 10 MG capsule Take 1 capsule (10 mg total) by mouth 4 (four) times daily -  before meals and at bedtime. Prn cramps 04/05/21   Rodriguez-Southworth, Nettie Elm, PA-C  medroxyPROGESTERone (DEPO-PROVERA) 150 MG/ML injection Inject 1 mL (150  mg total) into the muscle every 3 (three) months. 02/02/21   Cresenzo-Dishmon, Scarlette Calico, CNM  metroNIDAZOLE (FLAGYL) 500 MG tablet Take 1 tablet (500 mg total) by mouth 2 (two) times daily. 04/06/21   Lamptey, Britta Mccreedy, MD    Allergies    Patient has no known allergies.  Review of Systems   Review of Systems  Constitutional: Negative for appetite change and fatigue.  HENT: Negative for congestion, ear discharge and sinus pressure.   Eyes: Negative for discharge.  Respiratory: Negative for cough.   Cardiovascular: Negative for chest pain.  Gastrointestinal: Positive for abdominal pain. Negative for diarrhea.  Genitourinary: Negative for frequency and hematuria.  Musculoskeletal: Negative  for back pain.  Skin: Negative for rash.  Neurological: Negative for seizures and headaches.  Psychiatric/Behavioral: Negative for hallucinations.    Physical Exam Updated Vital Signs BP 106/78 (BP Location: Left Arm)   Pulse 89   Temp 98.5 F (36.9 C) (Oral)   Resp 17   Ht 5\' 1"  (1.549 m)   Wt 57 kg   LMP 04/12/2021   SpO2 100%   BMI 23.74 kg/m   Physical Exam Vitals reviewed.  Constitutional:      Appearance: She is well-developed.  HENT:     Head: Normocephalic.     Nose: Nose normal.  Eyes:     General: No scleral icterus.    Conjunctiva/sclera: Conjunctivae normal.  Neck:     Thyroid: No thyromegaly.  Cardiovascular:     Rate and Rhythm: Normal rate and regular rhythm.     Heart sounds: No murmur heard. No friction rub. No gallop.   Pulmonary:     Breath sounds: No stridor. No wheezing or rales.  Chest:     Chest wall: No tenderness.  Abdominal:     General: There is no distension.     Tenderness: There is abdominal tenderness. There is no rebound.  Musculoskeletal:        General: Normal range of motion.     Cervical back: Neck supple.  Lymphadenopathy:     Cervical: No cervical adenopathy.  Skin:    Findings: No erythema or rash.  Neurological:     Mental Status: She is alert and oriented to person, place, and time.     Motor: No abnormal muscle tone.     Coordination: Coordination normal.  Psychiatric:        Behavior: Behavior normal.     ED Results / Procedures / Treatments   Labs (all labs ordered are listed, but only abnormal results are displayed) Labs Reviewed  URINALYSIS, ROUTINE W REFLEX MICROSCOPIC - Abnormal; Notable for the following components:      Result Value   Color, Urine STRAW (*)    All other components within normal limits  PREGNANCY, URINE    EKG None  Radiology No results found.  Procedures Procedures   Medications Ordered in ED Medications - No data to display  ED Course  I have reviewed the triage vital  signs and the nursing notes.  Pertinent labs & imaging results that were available during my care of the patient were reviewed by me and considered in my medical decision making (see chart for details).    MDM Rules/Calculators/A&P                          Urine test does not show a UTI or pregnancy.  I reexamined the patient and she is having significant tenderness in  her suprapubic area I suggested a pelvic exam some blood work and possibly a CT scan.  She decided to leave AMA and follow-up with her doctor Final Clinical Impression(s) / ED Diagnoses Final diagnoses:  Lower abdominal pain    Rx / DC Orders ED Discharge Orders    None       Bethann Berkshire, MD 05/06/21 1358

## 2021-05-08 ENCOUNTER — Other Ambulatory Visit: Payer: Self-pay

## 2021-05-08 ENCOUNTER — Encounter: Payer: Self-pay | Admitting: Adult Health

## 2021-05-08 ENCOUNTER — Ambulatory Visit (INDEPENDENT_AMBULATORY_CARE_PROVIDER_SITE_OTHER): Payer: BC Managed Care – PPO | Admitting: Adult Health

## 2021-05-08 VITALS — BP 115/68 | HR 112 | Temp 98.4°F | Ht 61.0 in | Wt 124.0 lb

## 2021-05-08 DIAGNOSIS — B9689 Other specified bacterial agents as the cause of diseases classified elsewhere: Secondary | ICD-10-CM

## 2021-05-08 DIAGNOSIS — N760 Acute vaginitis: Secondary | ICD-10-CM | POA: Diagnosis not present

## 2021-05-08 DIAGNOSIS — R102 Pelvic and perineal pain: Secondary | ICD-10-CM | POA: Diagnosis not present

## 2021-05-08 DIAGNOSIS — N730 Acute parametritis and pelvic cellulitis: Secondary | ICD-10-CM

## 2021-05-08 DIAGNOSIS — B379 Candidiasis, unspecified: Secondary | ICD-10-CM | POA: Insufficient documentation

## 2021-05-08 LAB — CERVICOVAGINAL ANCILLARY ONLY
Bacterial Vaginitis (gardnerella): POSITIVE — AB
Candida Glabrata: NEGATIVE
Candida Vaginitis: POSITIVE — AB
Chlamydia: NEGATIVE
Comment: NEGATIVE
Comment: NEGATIVE
Comment: NEGATIVE
Comment: NEGATIVE
Comment: NEGATIVE
Comment: NORMAL
Neisseria Gonorrhea: NEGATIVE
Trichomonas: NEGATIVE

## 2021-05-08 MED ORDER — METRONIDAZOLE 500 MG PO TABS: 500.0000 mg | ORAL_TABLET | Freq: Two times a day (BID) | ORAL | 0 refills | Status: DC

## 2021-05-08 MED ORDER — DOXYCYCLINE HYCLATE 100 MG PO TABS
100.0000 mg | ORAL_TABLET | Freq: Two times a day (BID) | ORAL | 0 refills | Status: DC
Start: 2021-05-08 — End: 2021-05-30

## 2021-05-08 MED ORDER — CEFTRIAXONE SODIUM 500 MG IJ SOLR
500.0000 mg | Freq: Once | INTRAMUSCULAR | Status: AC
Start: 2021-05-08 — End: 2021-05-08
  Administered 2021-05-08: 500 mg via INTRAMUSCULAR

## 2021-05-08 MED ORDER — FLUCONAZOLE 150 MG PO TABS: ORAL_TABLET | ORAL | 0 refills | Status: DC

## 2021-05-08 NOTE — Progress Notes (Signed)
Subjective:     Patient ID: Morgan [name], female   DOB: [birth year], 25 y.o.   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 It started Thursday night, more suprapubic and now to the left. She left work early Friday, suppose to work Wednesday.  Review of Systems +pelvic pain, started Thursday night +nausea Denies vomiting or diarrhea Last sex about a month ago. Reviewed past medical,surgical, social and family history. Reviewed medications and allergies.     Objective:   Physical Exam BP 115/68 (BP Location: Right Arm, Patient Position: Sitting, Cuff Size: Normal)   Pulse (!) 112   Temp 98.4 F (36.9 C) (Oral)   Ht 5\' 1"  (1.549 m)   Wt 124 lb (56.2 kg)   LMP 04/12/2021   BMI 23.43 kg/m  Skin warm and dry.Pelvic: external genitalia is normal in appearance no lesions, vagina: white discharge without odor,urethra has no lesions or masses noted, cervix:smooth, +CMT, uterus: normal size, shape and contour, mildly tender, no masses felt, adnexa: no masses, mild tenderness noted, more to the left. Bladder is non tender and no masses felt.    Examination chaperoned by 04/14/2021 LPN She is late getting depo.  Upstream - 05/08/21 1547      Pregnancy Intention Screening   Does the patient want to become pregnant in the next year? No    Does the patient's partner want to become pregnant in the next year? No    Would the patient like to discuss contraceptive options today? No      Contraception Wrap Up   Current Method Hormonal Injection    End Method Hormonal Injection    Contraception Counseling Provided No          Assessment:     1. Pelvic pain  2. PID (acute pelvic inflammatory disease) Given Rocepjin 500 mg IM in office and waited 15 minutes Will rx doxycycline 100 mg 1 bid x 14 days and flagyl 500 mg 1  bid x 14 days  Meds ordered this encounter  Medications  . doxycycline (VIBRA-TABS) 100 MG tablet    Sig: Take 1 tablet (100 mg total) by mouth 2 (two) times daily.    Dispense:  28 tablet    Refill:  0    Order Specific Question:   Supervising Provider    Answer:   05/10/21, LUTHER H [[id number]]  . metroNIDAZOLE (FLAGYL) 500 MG tablet    Sig: Take 1 tablet (500 mg total) by mouth 2 (two) times daily.    Dispense:  28 tablet    Refill:  0    Order Specific Question:   Supervising Provider    Answer:   Despina Hidden, LUTHER H [[id number]]  . fluconazole (DIFLUCAN) 150 MG tablet    Sig: Take 1 now and 1 in 3 days    Dispense:  2 tablet    Refill:  0    Order Specific Question:   Supervising Provider    Answer:   EURE, LUTHER H [[id number]]  . cefTRIAXone (ROCEPHIN) injection 500 mg  Review handout on PID.   3. BV (bacterial vaginosis) Rx flagyl  4. Yeast infection RX diflucan    Plan:    Follow up in 10 days or sooner if needed  Call me tomorrow to see if can work Wednesday, will give note

## 2021-05-08 NOTE — Patient Instructions (Signed)
Pelvic Inflammatory Disease  Pelvic inflammatory disease (PID) is an infection in some or all of the female reproductive organs. PID can be in the womb (uterus), ovaries, fallopian tubes, or nearby tissues that are inside the lower belly area (pelvis). PID can lead to problems if it is not treated. What are the causes?  Germs (bacteria) that are spread during sex. This is the most common cause.  Germs in the vagina that are not spread during sex.  Germs that travel up from the vagina or cervix to the reproductive organs after: ? The birth of a baby. ? A miscarriage. ? An abortion. ? Pelvic surgery. ? Insertion of an intrauterine device (IUD). ? A sexual assault. What increases the risk?  Being younger than 25 years old.  Having sex at a young age.  Having a history of STI (sexually transmitted infection) or PID.  Not using barrier birth control, such as condoms.  Having a lot of sex partners.  Having sex with someone who has symptoms of an STI.  Using a douche.  Having an IUD put in place. What are the signs or symptoms?  Pain in the belly area.  Fever.  Chills.  Discharge from the vagina that is not normal.  Bleeding from the womb that is not normal.  Pain soon after the end of a menstrual period.  Pain when you pee (urinate).  Pain with sex.  Feeling sick to your stomach (nauseous) or throwing up (vomiting). How is this treated?  Antibiotic medicines. In very bad cases, these may be given through an IV tube.  Surgery. This is rare.  Efforts to stop the spread of the infection. Sex partners may need to be treated. It may take weeks until you feel all better. Your doctor may test you for infection again after you finish treatment. You should also be checked for HIV (human immunodeficiency virus). Follow these instructions at home:  Take over-the-counter and prescription medicines only as told by your doctor.  If you were prescribed an antibiotic  medicine, take it as told by your doctor. Do not stop taking it even if you start to feel better.  Do not have sex until treatment is done or as told by your doctor.  Tell your sex partner if you have PID. Your partner may need to be treated.  Keep all follow-up visits as told by your doctor. This is important. Contact a doctor if:  You have more fluid or fluid that is not normal coming from your vagina.  Your pain does not improve.  You throw up.  You have a fever.  You cannot take your medicines.  Your partner has an STI.  You have pain when you pee. Get help right away if:  You have more pain in the belly area.  You have chills.  You are not better in 72 hours with treatment. Summary  Pelvic inflammatory disease (PID) is caused by an infection in some or all of the female reproductive organs.  PID is a serious infection.  This infection is most often treated with antibiotics.  Do not have sex until treatment is done or as told by your doctor. This information is not intended to replace advice given to you by your health care provider. Make sure you discuss any questions you have with your health care provider. Document Revised: 08/28/2018 Document Reviewed: 09/03/2018 Elsevier Patient Education  2021 Elsevier Inc.  

## 2021-05-09 DIAGNOSIS — F4320 Adjustment disorder, unspecified: Secondary | ICD-10-CM | POA: Diagnosis not present

## 2021-05-18 ENCOUNTER — Telehealth: Payer: Self-pay | Admitting: Adult Health

## 2021-05-18 ENCOUNTER — Ambulatory Visit: Payer: BC Managed Care – PPO | Admitting: Adult Health

## 2021-05-19 ENCOUNTER — Encounter: Payer: Self-pay | Admitting: Adult Health

## 2021-05-25 DIAGNOSIS — F4320 Adjustment disorder, unspecified: Secondary | ICD-10-CM | POA: Diagnosis not present

## 2021-05-30 ENCOUNTER — Ambulatory Visit: Payer: BC Managed Care – PPO | Admitting: Adult Health

## 2021-05-30 ENCOUNTER — Other Ambulatory Visit (HOSPITAL_COMMUNITY)
Admission: RE | Admit: 2021-05-30 | Discharge: 2021-05-30 | Disposition: A | Discharge status: Home / Self Care | Payer: BC Managed Care – PPO | Source: Ambulatory Visit | Attending: Adult Health | Admitting: Adult Health

## 2021-05-30 ENCOUNTER — Other Ambulatory Visit: Payer: Self-pay

## 2021-05-30 ENCOUNTER — Encounter: Payer: Self-pay | Admitting: Adult Health

## 2021-05-30 VITALS — BP 117/70 | HR 80 | Ht 61.0 in | Wt 123.0 lb

## 2021-05-30 DIAGNOSIS — Z8742 Personal history of other diseases of the female genital tract: Secondary | ICD-10-CM | POA: Insufficient documentation

## 2021-05-30 DIAGNOSIS — R1032 Left lower quadrant pain: Secondary | ICD-10-CM | POA: Insufficient documentation

## 2021-05-30 DIAGNOSIS — R102 Pelvic and perineal pain: Secondary | ICD-10-CM | POA: Insufficient documentation

## 2021-05-30 DIAGNOSIS — R197 Diarrhea, unspecified: Secondary | ICD-10-CM | POA: Diagnosis not present

## 2021-05-30 MED ORDER — DICYCLOMINE HCL 10 MG PO CAPS: 10.0000 mg | ORAL_CAPSULE | Freq: Three times a day (TID) | ORAL | 1 refills | Status: DC

## 2021-05-30 NOTE — Progress Notes (Signed)
  Subjective:     Patient ID: Nilsa Nutting, female   DOB: [birth year], 25 y.o.   MRN: [id number]  HPI [name] is a 25 year old white female,single, G0P0, back in follow up on being treated for PID 05/08/21, still has pain that comes and goes, like ache, then yesterday had pain LLQ and mucous discharge, has been having diarrhea, esp after eating. Missed last depo, shot, but not having sex.   Review of Systems Pelvic pain LLQ pain +diarrhea esp after eating Reviewed past medical,surgical, social and family history. Reviewed medications and allergies.     Objective:   Physical Exam BP 117/70 (BP Location: Right Arm, Patient Position: Sitting, Cuff Size: Normal)   Pulse 80   Ht 5\' 1"  (1.549 m)   Wt 123 lb (55.8 kg)   LMP 05/18/2021 (Approximate)   BMI 23.24 kg/m  Skin warm and dry.Pelvic: external genitalia is normal in appearance no lesions, vagina: creamy discharge without odor,urethra has no lesions or masses noted, cervix:smooth, uterus: normal size, shape and contour, mildly tender, no masses felt, adnexa: no masses, LLQ tenderness noted. Bladder is non tender and no masses felt.CV swab obtained  Upstream - 05/30/21 07/30/21      Pregnancy Intention Screening   Does the patient want to become pregnant in the next year? No    Does the patient's partner want to become pregnant in the next year? No    Would the patient like to discuss contraceptive options today? No      Contraception Wrap Up   Current Method Abstinence    End Method Abstinence    Contraception Counseling Provided No         Examination chaperoned by [id number] LPN    Assessment:     1. Pelvic pain Will check labs and get pelvic Faith Rogue to assess uterus and ovaries US scheduled 06/01/21 at 2:30 pm at [facility] - CBC - Comprehensive metabolic panel - Sedimentation rate - Cervicovaginal ancillary only - [facility] PELVIC COMPLETE WITH TRANSVAGINAL; Future  2. LLQ pain - CBC - Comprehensive metabolic panel -  Sedimentation rate - Cervicovaginal ancillary only - US PELVIC COMPLETE WITH TRANSVAGINAL; Future  3. History of PID - Cervicovaginal ancillary only - US PELVIC COMPLETE WITH TRANSVAGINAL; Future  4. Diarrhea, unspecified type Will try bentyl Meds ordered this encounter  Medications  . dicyclomine (BENTYL) 10 MG capsule    Sig: Take 1 capsule (10 mg total) by mouth 3 (three) times daily before meals.    Dispense:  90 capsule    Refill:  1    Order Specific Question:   Supervising Provider    Answer:   [place] H [[id number]]   - Comprehensive metabolic panel    Plan:     Follow up in 1 week, may need GI referral  If does not work, call for a note

## 2021-05-31 LAB — COMPREHENSIVE METABOLIC PANEL
ALT: 9 IU/L (ref 0–32)
AST: 14 IU/L (ref 0–40)
Albumin/Globulin Ratio: 1.8 (ref 1.2–2.2)
Albumin: 4.5 g/dL (ref 3.9–5.0)
Alkaline Phosphatase: 73 IU/L (ref 44–121)
BUN/Creatinine Ratio: 9 (ref 9–23)
BUN: 9 mg/dL (ref 6–20)
Bilirubin Total: 0.3 mg/dL (ref 0.0–1.2)
CO2: 22 mmol/L (ref 20–29)
Calcium: 9.3 mg/dL (ref 8.7–10.2)
Chloride: 104 mmol/L (ref 96–106)
Creatinine, Ser: 0.97 mg/dL (ref 0.57–1.00)
Globulin, Total: 2.5 g/dL (ref 1.5–4.5)
Glucose: 84 mg/dL (ref 65–99)
Potassium: 4.4 mmol/L (ref 3.5–5.2)
Sodium: 139 mmol/L (ref 134–144)
Total Protein: 7 g/dL (ref 6.0–8.5)
eGFR: 84 mL/min/{1.73_m2} (ref >59)

## 2021-05-31 LAB — CBC
Hematocrit: 39.8 % (ref 34.0–46.6)
Hemoglobin: 13.1 g/dL (ref 11.1–15.9)
MCH: 31 pg (ref 26.6–33.0)
MCHC: 32.9 g/dL (ref 31.5–35.7)
MCV: 94 fL (ref 79–97)
Platelets: 234 10*3/uL (ref 150–450)
RBC: 4.22 x10E6/uL (ref 3.77–5.28)
RDW: 12.6 % (ref 11.7–15.4)
WBC: 4.3 10*3/uL (ref 3.4–10.8)

## 2021-05-31 LAB — CERVICOVAGINAL ANCILLARY ONLY
Bacterial Vaginitis (gardnerella): NEGATIVE
Candida Glabrata: NEGATIVE
Candida Vaginitis: NEGATIVE
Chlamydia: NEGATIVE
Comment: NEGATIVE
Comment: NEGATIVE
Comment: NEGATIVE
Comment: NEGATIVE
Comment: NEGATIVE
Comment: NORMAL
Neisseria Gonorrhea: NEGATIVE
Trichomonas: NEGATIVE

## 2021-05-31 LAB — SEDIMENTATION RATE: Sed Rate: 2 mm/hr (ref 0–32)

## 2021-06-01 ENCOUNTER — Other Ambulatory Visit: Payer: Self-pay

## 2021-06-01 ENCOUNTER — Ambulatory Visit (HOSPITAL_COMMUNITY)
Admission: RE | Admit: 2021-06-01 | Discharge: 2021-06-01 | Disposition: A | Discharge status: Home / Self Care | Payer: BC Managed Care – PPO | Source: Ambulatory Visit | Attending: Adult Health | Admitting: Adult Health

## 2021-06-01 DIAGNOSIS — Z8742 Personal history of other diseases of the female genital tract: Secondary | ICD-10-CM | POA: Insufficient documentation

## 2021-06-01 DIAGNOSIS — R102 Pelvic and perineal pain: Secondary | ICD-10-CM | POA: Diagnosis not present

## 2021-06-01 DIAGNOSIS — R1032 Left lower quadrant pain: Secondary | ICD-10-CM | POA: Insufficient documentation

## 2021-06-06 ENCOUNTER — Encounter: Payer: Self-pay | Admitting: Adult Health

## 2021-06-06 ENCOUNTER — Ambulatory Visit: Payer: BC Managed Care – PPO | Admitting: Adult Health

## 2021-06-06 ENCOUNTER — Other Ambulatory Visit: Payer: Self-pay

## 2021-06-06 VITALS — BP 101/62 | HR 84 | Ht 61.0 in | Wt 126.0 lb

## 2021-06-06 DIAGNOSIS — Z30013 Encounter for initial prescription of injectable contraceptive: Secondary | ICD-10-CM | POA: Diagnosis not present

## 2021-06-06 DIAGNOSIS — K580 Irritable bowel syndrome with diarrhea: Secondary | ICD-10-CM | POA: Diagnosis not present

## 2021-06-06 DIAGNOSIS — R102 Pelvic and perineal pain: Secondary | ICD-10-CM

## 2021-06-06 MED ORDER — MEDROXYPROGESTERONE ACETATE 150 MG/ML IM SUSP: 150.0000 mg | INTRAMUSCULAR | 4 refills | Status: DC

## 2021-06-06 MED ORDER — DICYCLOMINE HCL 20 MG PO TABS: 20.0000 mg | ORAL_TABLET | Freq: Three times a day (TID) | ORAL | 3 refills | Status: DC

## 2021-06-06 NOTE — Progress Notes (Signed)
  Subjective:     Patient ID: Morgan [name], female   DOB: Aug 09, 1996, 25 y.o.   MRN: [id number]  HPI Morgan [name] is a 25 year old white female, single, G0P0 back in follow up after [place] 06/01/21. She feels a better. She wants to get back on depo, too.  Review of Systems Pain is better and diarrhea is a little better with bentyl Reviewed past medical,surgical, social and family history. Reviewed medications and allergies.     Objective:   Physical Exam BP 101/62 (BP Location: Left Arm, Patient Position: Sitting, Cuff Size: Normal)   Pulse 84   Ht 5\' 1"  (1.549 m)   Wt 126 lb (57.2 kg)   LMP 05/18/2021 (Approximate)   BMI 23.81 kg/m  Skin warm and dry.Lungs: clear to ausculation bilaterally. Cardiovascular: regular rate and rhythm.    05/20/2021 was normal. Fall risk is low  Upstream - 06/06/21 1159       Pregnancy Intention Screening   Does the patient want to become pregnant in the next year? No    Does the patient's partner want to become pregnant in the next year? No    Would the patient like to discuss contraceptive options today? Yes      Contraception Wrap Up   Current Method Abstinence    End Method Hormonal Injection    Contraception Counseling Provided Yes             Assessment:     1. Irritable bowel syndrome with diarrhea Will increase bentyl to 20 mg Meds ordered this encounter  Medications   medroxyPROGESTERone (DEPO-PROVERA) 150 MG/ML injection    Sig: Inject 1 mL (150 mg total) into the muscle every 3 (three) months.    Dispense:  1 mL    Refill:  4    Order Specific Question:   Supervising Provider    Answer:   06/08/21, LUTHER H [[id number]]   dicyclomine (BENTYL) 20 MG tablet    Sig: Take 1 tablet (20 mg total) by mouth 4 (four) times daily -  before meals and at bedtime.    Dispense:  120 tablet    Refill:  3    Order Specific Question:   Supervising Provider    Answer:   Despina Hidden [[id number]]   Referred to [facility], Lazaro Arms NP  2. Pelvic pain  3. Contraceptive  management with depo   Rx depo Plan:    Call with period for depo   Note given missed work Saturday and Sunday

## 2021-06-07 ENCOUNTER — Encounter: Payer: Self-pay | Admitting: Internal Medicine

## 2021-06-12 ENCOUNTER — Ambulatory Visit (INDEPENDENT_AMBULATORY_CARE_PROVIDER_SITE_OTHER): Payer: BC Managed Care – PPO | Admitting: *Deleted

## 2021-06-12 ENCOUNTER — Encounter: Payer: Self-pay | Admitting: Adult Health

## 2021-06-12 ENCOUNTER — Ambulatory Visit: Payer: BC Managed Care – PPO

## 2021-06-12 ENCOUNTER — Other Ambulatory Visit: Payer: Self-pay

## 2021-06-12 DIAGNOSIS — Z3202 Encounter for pregnancy test, result negative: Secondary | ICD-10-CM

## 2021-06-12 DIAGNOSIS — Z308 Encounter for other contraceptive management: Secondary | ICD-10-CM

## 2021-06-12 LAB — POCT URINE PREGNANCY: Preg Test, Ur: NEGATIVE

## 2021-06-12 MED ORDER — MEDROXYPROGESTERONE ACETATE 150 MG/ML IM SUSP
150.0000 mg | Freq: Once | INTRAMUSCULAR | Status: AC
  Administered 2021-06-12: 150 mg via INTRAMUSCULAR

## 2021-06-12 NOTE — Progress Notes (Signed)
   NURSE VISIT- INJECTION  SUBJECTIVE:  Morgan [name] is a 25 y.o. G0P0000 female here for a Depo Provera for contraception/period management. She is a GYN patient.   OBJECTIVE:  LMP 05/18/2021 (Approximate)   Appears well, in no apparent distress  Injection administered in: Right upper quad. gluteus  Meds ordered this encounter  Medications   medroxyPROGESTERone (DEPO-PROVERA) injection 150 mg    ASSESSMENT: GYN patient Depo Provera for contraception/period management PLAN: Follow-up: in 11-13 weeks for next Depo   Malachy Mood  06/12/2021 3:22 PM

## 2021-06-17 ENCOUNTER — Other Ambulatory Visit: Payer: Self-pay

## 2021-06-17 ENCOUNTER — Ambulatory Visit
Admission: EM | Admit: 2021-06-17 | Discharge: 2021-06-17 | Disposition: A | Discharge status: Home / Self Care | Payer: BC Managed Care – PPO | Attending: Family Medicine | Admitting: Family Medicine

## 2021-06-17 DIAGNOSIS — J069 Acute upper respiratory infection, unspecified: Secondary | ICD-10-CM

## 2021-06-17 LAB — POCT RAPID STREP A (OFFICE): Rapid Strep A Screen: NEGATIVE

## 2021-06-17 MED ORDER — BENZONATATE 100 MG PO CAPS: 100.0000 mg | ORAL_CAPSULE | Freq: Three times a day (TID) | ORAL | 0 refills | Status: DC

## 2021-06-17 NOTE — ED Provider Notes (Signed)
[facility]   [id number] 06/17/21 Arrival Time: 0912   CC: COVID symptoms  SUBJECTIVE: History from: patient.  Morgan [name] is a 25 y.o. female who presents with cough, headache, sore throat since yesterday. Reports negative rapid Covid test today. Denies sick exposure to COVID, flu or strep. Denies recent travel. Has negative history of Covid. Has not completed Covid vaccines. Has not completed flu vaccine in the last year. Has not taken OTC medications for this. There are no aggravating or alleviating factors. Denies previous symptoms in the past. Denies fever, sinus pain, rhinorrhea, SOB, wheezing, chest pain, nausea, changes in bowel or bladder habits.    ROS: As per HPI.  All other pertinent ROS negative.     Past Medical History:  Diagnosis Date   Chlamydia 01/28/2020   Treated 01/28/20, POC___________   Contraceptive management 04/25/2016   Past Surgical History:  Procedure Laterality Date   ROOT CANAL  03/11/2017   No Known Allergies No current facility-administered medications on file prior to encounter.   Current Outpatient Medications on File Prior to Encounter  Medication Sig Dispense Refill   dicyclomine (BENTYL) 20 MG tablet Take 1 tablet (20 mg total) by mouth 4 (four) times daily -  before meals and at bedtime. 120 tablet 3   medroxyPROGESTERone (DEPO-PROVERA) 150 MG/ML injection Inject 1 mL (150 mg total) into the muscle every 3 (three) months. 1 mL 4   Social History   Socioeconomic History   Marital status: Single    Spouse name: Not on file   Number of children: Not on file   Years of education: Not on file   Highest education level: Not on file  Occupational History   Not on file  Tobacco Use   Smoking status: Never   Smokeless tobacco: Never  Vaping Use   Vaping Use: Some days  Substance and Sexual Activity   Alcohol use: No   Drug use: Yes    Types: Marijuana    Comment: last used today   Sexual activity: Not Currently    Birth  control/protection: None  Other Topics Concern   Not on file  Social History Narrative   Not on file   Social Determinants of Health   Financial Resource Strain: Not on file  Food Insecurity: Not on file  Transportation Needs: Not on file  Physical Activity: Not on file  Stress: Not on file  Social Connections: Not on file  Intimate Partner Violence: Not on file   Family History  Problem Relation Age of Onset   Diabetes Paternal Aunt    Cancer Paternal Aunt    Alzheimer's disease Paternal Aunt    Diabetes Maternal Grandmother    Cancer Paternal Grandmother    Stroke Paternal Grandmother    Heart attack Paternal Grandmother    Diabetes Paternal Grandmother    Diabetes Other     OBJECTIVE:  Vitals:   06/17/21 0926  BP: 108/70  Pulse: 80  Resp: 16  Temp: 98.7 F (37.1 C)  TempSrc: Oral  SpO2: 97%     General appearance: alert; appears fatigued, but nontoxic; speaking in full sentences and tolerating own secretions HEENT: NCAT; Ears: EACs clear, TMs pearly gray; Eyes: PERRL.  EOM grossly intact. Sinuses: nontender; Nose: nares patent with clear rhinorrhea, Throat: oropharynx erythematous, cobblestoning present, tonsils non erythematous or enlarged, uvula midline  Neck: supple without LAD Lungs: unlabored respirations, symmetrical air entry; cough: mild; no respiratory distress; CTAB Heart: regular rate and rhythm.  Radial pulses  2+ symmetrical bilaterally Skin: warm and dry Psychological: alert and cooperative; normal mood and affect  LABS:  Results for orders placed or performed during the hospital encounter of 06/17/21 (from the past 24 hour(s))  POCT rapid strep A     Status: None   Collection Time: 06/17/21  9:33 AM  Result Value Ref Range   Rapid Strep A Screen Negative Negative     ASSESSMENT & PLAN:  1. Viral URI with cough     Meds ordered this encounter  Medications   benzonatate (TESSALON) 100 MG capsule    Sig: Take 1 capsule (100 mg total) by  mouth every 8 (eight) hours.    Dispense:  21 capsule    Refill:  0    Order Specific Question:   Supervising Provider    Answer:   Merrilee Jansky [id number]   Your rapid strep test is negative. A throat culture is pending; we will call you if it is positive requiring treatment.   Prescribed tessalon perles for cough prn Continue supportive care at home COVID and flu testing ordered. It will take between 2-3 days for test results. Someone will contact you regarding abnormal results.   Work note provided Patient should remain in quarantine until they have received Covid results. If negative you may resume normal activities (go back to work/school) while practicing hand hygiene, social distance, and mask wearing.  If positive, patient should remain in quarantine for at least 5 days from symptom onset AND greater than 72 hours after symptoms resolution (absence of fever without the use of fever-reducing medication and improvement in respiratory symptoms), whichever is longer Get plenty of rest and push fluids Use OTC zyrtec for nasal congestion, runny nose, and/or sore throat Use OTC flonase for nasal congestion and runny nose Use medications daily for symptom relief Use OTC medications like ibuprofen or tylenol as needed fever or pain Call or go to the ED if you have any new or worsening symptoms such as fever, worsening cough, shortness of breath, chest tightness, chest pain, turning blue, changes in mental status.  Reviewed expectations re: course of current medical issues. Questions answered. Outlined signs and symptoms indicating need for more acute intervention. Patient verbalized understanding. After Visit Summary given.          Moshe Cipro, NP 06/17/21 [phone]

## 2021-06-17 NOTE — ED Triage Notes (Addendum)
Patient presents to Urgent Care with complaints of cough, headache, and sore throat since yesterday. Pt states she was seen by occupational health nurse today who instructed her to come in for evaluation of covid/strep/flu. Pt states she had a negative rapid COVID test. Treating symptoms tylenol and cough meds.   Denies fever.

## 2021-06-17 NOTE — Discharge Instructions (Addendum)
I have sent in tessalon perles for you to use one capsule every 8 hours as needed for cough.  Your rapid strep test is negative.  A throat culture is pending; we will call you if it is positive requiring treatment.    Your COVID and Influenza tests are pending.  You should self quarantine until the test results are back.    Take Tylenol or ibuprofen as needed for fever or discomfort.  Rest and keep yourself hydrated.    Follow-up with your primary care provider if your symptoms are not improving.

## 2021-06-18 LAB — COVID-19, FLU A+B NAA
Influenza A, NAA: NOT DETECTED
Influenza B, NAA: NOT DETECTED
SARS-CoV-2, NAA: NOT DETECTED

## 2021-06-22 DIAGNOSIS — F4320 Adjustment disorder, unspecified: Secondary | ICD-10-CM | POA: Diagnosis not present

## 2021-07-15 ENCOUNTER — Encounter: Payer: Self-pay | Admitting: Emergency Medicine

## 2021-07-15 ENCOUNTER — Other Ambulatory Visit: Payer: Self-pay

## 2021-07-15 ENCOUNTER — Ambulatory Visit: Payer: Self-pay

## 2021-07-15 ENCOUNTER — Ambulatory Visit
Admission: EM | Admit: 2021-07-15 | Discharge: 2021-07-15 | Disposition: A | Discharge status: Home / Self Care | Payer: BC Managed Care – PPO | Attending: Family Medicine | Admitting: Family Medicine

## 2021-07-15 DIAGNOSIS — N3000 Acute cystitis without hematuria: Secondary | ICD-10-CM | POA: Insufficient documentation

## 2021-07-15 LAB — POCT URINALYSIS DIP (MANUAL ENTRY)
Bilirubin, UA: NEGATIVE
Blood, UA: NEGATIVE
Glucose, UA: NEGATIVE mg/dL
Ketones, POC UA: NEGATIVE mg/dL
Leukocytes, UA: NEGATIVE
Nitrite, UA: POSITIVE — AB
Protein Ur, POC: NEGATIVE mg/dL
Spec Grav, UA: >=1.03 — AB (ref 1.010–1.025)
Urobilinogen, UA: 0.2 E.U./dL
pH, UA: 5.5 (ref 5.0–8.0)

## 2021-07-15 LAB — POCT URINE PREGNANCY: Preg Test, Ur: NEGATIVE

## 2021-07-15 MED ORDER — NITROFURANTOIN MONOHYD MACRO 100 MG PO CAPS: 100.0000 mg | ORAL_CAPSULE | Freq: Two times a day (BID) | ORAL | 0 refills | Status: AC

## 2021-07-15 MED ORDER — FLUCONAZOLE 150 MG PO TABS: 150.0000 mg | ORAL_TABLET | Freq: Once | ORAL | 0 refills | Status: AC

## 2021-07-15 NOTE — ED Triage Notes (Signed)
Odor to urine, urinary frequency, and lower abd pain x 3 days

## 2021-07-15 NOTE — ED Provider Notes (Signed)
[facility]    CSN: [id number] Arrival date & time: 07/15/21  1006      History   Chief Complaint No chief complaint on file.   HPI Morgan [name] is a 25 y.o. female.   HPI Patient presents today for evaluation of lower abdominal pain, urinary urgency and dysuria x3 days.  Patient has a history of recurrent urinary tract infections and pyelonephritis however prior urine cultures did not yield any bacterial growth.  Patient is afebrile at present. Past Medical History:  Diagnosis Date   Chlamydia 01/28/2020   Treated 01/28/20, POC___________   Contraceptive management 04/25/2016    Patient Active Problem List   Diagnosis Date Noted   Irritable bowel syndrome with diarrhea 06/06/2021   Diarrhea 05/30/2021   History of PID 05/30/2021   LLQ pain 05/30/2021   Yeast infection 05/08/2021   Swelling of vulva 07/07/2020   Vaginal itching 04/21/2020   History of chlamydia 04/21/2020   Vaginal odor 04/21/2020   Vaginal discharge 04/21/2020   Chlamydia 01/28/2020   Screening examination for STD (sexually transmitted disease) 11/17/2019   Dysuria 11/17/2019   Pelvic pain 11/17/2019   RUQ pain 11/17/2019   Right flank pain 11/17/2019   PID (acute pelvic inflammatory disease) 11/17/2019   Mass of breast, right 10/09/2019   Nipple discharge 10/09/2019   Breast pain 10/09/2019   Encounter for initial prescription of injectable contraceptive 02/04/2019   Encounter for gynecological examination with Papanicolaou smear of cervix 02/04/2019   Urinary tract infection without hematuria 04/09/2017   BV (bacterial vaginosis) 04/09/2017   Pyelonephritis, acute 11/07/2016   GERD (gastroesophageal reflux disease) 11/18/2013   Radial styloid fracture 02/24/2013   Scaphoid fracture of wrist 02/24/2013   CLOSED FRACTURE OF CUBOID BONE 11/07/2009    Past Surgical History:  Procedure Laterality Date   ROOT CANAL  03/11/2017    OB History     Gravida  0   Para  0   Term  0    Preterm  0   AB  0   Living  0      SAB  0   IAB  0   Ectopic  0   Multiple  0   Live Births               Home Medications    Prior to Admission medications   Medication Sig Start Date End Date Taking? Authorizing Provider  fluconazole (DIFLUCAN) 150 MG tablet Take 1 tablet (150 mg total) by mouth once for 1 dose. Repeat if needed 07/15/21 07/15/21 Yes Bing Neighbors, FNP  nitrofurantoin, macrocrystal-monohydrate, (MACROBID) 100 MG capsule Take 1 capsule (100 mg total) by mouth 2 (two) times daily for 5 days. 07/15/21 07/20/21 Yes Bing Neighbors, FNP  benzonatate (TESSALON) 100 MG capsule Take 1 capsule (100 mg total) by mouth every 8 (eight) hours. 06/17/21   Moshe Cipro, NP  dicyclomine (BENTYL) 20 MG tablet Take 1 tablet (20 mg total) by mouth 4 (four) times daily -  before meals and at bedtime. 06/06/21   Adline Potter, NP  medroxyPROGESTERone (DEPO-PROVERA) 150 MG/ML injection Inject 1 mL (150 mg total) into the muscle every 3 (three) months. 06/06/21   Adline Potter, NP    Family History Family History  Problem Relation Age of Onset   Diabetes Paternal Aunt    Cancer Paternal Aunt    Alzheimer's disease Paternal Aunt    Diabetes Maternal Grandmother    Cancer Paternal Grandmother  Stroke Paternal Grandmother    Heart attack Paternal Grandmother    Diabetes Paternal Grandmother    Diabetes Other     Social History Social History   Tobacco Use   Smoking status: Never   Smokeless tobacco: Never  Vaping Use   Vaping Use: Some days  Substance Use Topics   Alcohol use: No   Drug use: Yes    Types: Marijuana    Comment: last used today     Allergies   Patient has no known allergies.   Review of Systems Review of Systems Pertinent negatives listed in HPI   Physical Exam Triage Vital Signs ED Triage Vitals  Enc Vitals Group     BP 07/15/21 1011 104/69     Pulse Rate 07/15/21 1011 78     Resp 07/15/21 1011 16      Temp 07/15/21 1011 97.9 F (36.6 C)     Temp Source 07/15/21 1011 Temporal     SpO2 07/15/21 1011 98 %     Weight --      Height --      Head Circumference --      Peak Flow --      Pain Score 07/15/21 1012 8     Pain Loc --      Pain Edu? --      Excl. in GC? --    No data found.  Updated Vital Signs BP 104/69 (BP Location: Right Arm)   Pulse 78   Temp 97.9 F (36.6 C) (Temporal)   Resp 16   SpO2 98%   Visual Acuity Right Eye Distance:   Left Eye Distance:   Bilateral Distance:    Right Eye Near:   Left Eye Near:    Bilateral Near:     Physical Exam General appearance: alert, well developed, well nourished, cooperative  Head: Normocephalic, without obvious abnormality, atraumatic Respiratory: Respirations even and unlabored, normal respiratory rate Heart: rate and rhythm normal. No gallop or murmurs noted on exam  Abdomen: BS +, no distention, no rebound tenderness, or no mass Extremities: No gross deformities Skin: Skin color, texture, turgor normal. No rashes seen  Psych: Appropriate mood and affect. Neurologic: GCS 15, normal coordination normal gait UC Treatments / Results  Labs (all labs ordered are listed, but only abnormal results are displayed) Labs Reviewed  POCT URINALYSIS DIP (MANUAL ENTRY) - Abnormal; Notable for the following components:      Result Value   Spec Grav, UA >=1.030 (*)    Nitrite, UA Positive (*)    All other components within normal limits  URINE CULTURE  POCT URINE PREGNANCY    EKG   Radiology No results found.  Procedures Procedures (including critical care time)  Medications Ordered in UC Medications - No data to display  Initial Impression / Assessment and Plan / UC Course  I have reviewed the triage vital signs and the nursing notes.  Pertinent labs & imaging results that were available during my care of the patient were reviewed by me and considered in my medical decision making (see chart for details).       UA abnormal and findings consistent with UTI. Empiric antibiotic treatment initiated.  Encouraged increase intake of water. Recommended cranberry tablets, wiping from front to back, and urinating prior to and after cortis to reduce risk for reinfection. Urine culture pending. ER if symptoms become severe. Follow-up with PCP if symptoms do not completely resolve.  Final Clinical Impressions(s) / UC Diagnoses  Final diagnoses:  Acute cystitis without hematuria   Discharge Instructions   None    ED Prescriptions     Medication Sig Dispense Auth. Provider   nitrofurantoin, macrocrystal-monohydrate, (MACROBID) 100 MG capsule Take 1 capsule (100 mg total) by mouth 2 (two) times daily for 5 days. 10 capsule Bing Neighbors, FNP   fluconazole (DIFLUCAN) 150 MG tablet Take 1 tablet (150 mg total) by mouth once for 1 dose. Repeat if needed 2 tablet Bing Neighbors, FNP      PDMP not reviewed this encounter.   Bing Neighbors, FNP 07/15/21 1046

## 2021-07-17 LAB — URINE CULTURE
Culture: >=100000 — AB
Special Requests: NORMAL

## 2021-09-04 ENCOUNTER — Other Ambulatory Visit: Payer: Self-pay

## 2021-09-04 ENCOUNTER — Ambulatory Visit: Payer: BC Managed Care – PPO | Admitting: Adult Health

## 2021-09-04 ENCOUNTER — Other Ambulatory Visit (HOSPITAL_COMMUNITY)
Admission: RE | Admit: 2021-09-04 | Discharge: 2021-09-04 | Disposition: A | Discharge status: Home / Self Care | Payer: BC Managed Care – PPO | Source: Ambulatory Visit | Attending: Adult Health | Admitting: Adult Health

## 2021-09-04 ENCOUNTER — Encounter: Payer: Self-pay | Admitting: Adult Health

## 2021-09-04 VITALS — BP 120/76 | HR 110 | Ht 61.0 in | Wt 123.5 lb

## 2021-09-04 DIAGNOSIS — Z113 Encounter for screening for infections with a predominantly sexual mode of transmission: Secondary | ICD-10-CM

## 2021-09-04 DIAGNOSIS — R1032 Left lower quadrant pain: Secondary | ICD-10-CM

## 2021-09-04 DIAGNOSIS — N921 Excessive and frequent menstruation with irregular cycle: Secondary | ICD-10-CM

## 2021-09-04 DIAGNOSIS — Z30011 Encounter for initial prescription of contraceptive pills: Secondary | ICD-10-CM

## 2021-09-04 DIAGNOSIS — K580 Irritable bowel syndrome with diarrhea: Secondary | ICD-10-CM

## 2021-09-04 MED ORDER — DICYCLOMINE HCL 20 MG PO TABS: 20.0000 mg | ORAL_TABLET | Freq: Three times a day (TID) | ORAL | 3 refills | Status: DC

## 2021-09-04 MED ORDER — LO LOESTRIN FE 1 MG-10 MCG / 10 MCG PO TABS: 1.0000 | ORAL_TABLET | Freq: Every day | ORAL | 11 refills | Status: DC

## 2021-09-04 NOTE — Progress Notes (Signed)
  Subjective:     Patient ID: Morgan [name], female   DOB: 01-08-1996, 25 y.o.   MRN: [id number]  HPI Morgan [name] is a 25 year old white female, single, G0P0, in to discuss changing from depo to OCs. She is having irregular bleeding and stomach pain. Last depo 06/12/21. Treated for UTI in July at Urgent Care.  Lab Results  Component Value Date   DIAGPAP  02/04/2019    NEGATIVE FOR INTRAEPITHELIAL LESIONS OR MALIGNANCY.    Review of Systems +irregular bleeding +abdominal pain Diarrhea not has often No sex lately Reviewed past medical,surgical, social and family history. Reviewed medications and allergies.     Objective:   Physical Exam BP 120/76 (BP Location: Left Arm, Patient Position: Sitting, Cuff Size: Normal)   Pulse (!) 110   Ht 5\' 1"  (1.549 m)   Wt 123 lb 8 oz (56 kg)   BMI 23.34 kg/m     Skin warm and dry.Pelvic: external genitalia is normal in appearance no lesions, vagina: brown discharge without odor,urethra has no lesions or masses noted, cervix:smooth,no CMT, uterus: normal size, shape and contour, non tender, no masses felt, adnexa: no masses, +LLQ tenderness noted. Bladder is non tender and no masses felt. CV swab obtained. Fall risk is low  Upstream - 09/04/21 1405       Pregnancy Intention Screening   Does the patient want to become pregnant in the next year? No    Does the patient's partner want to become pregnant in the next year? No    Would the patient like to discuss contraceptive options today? Yes      Contraception Wrap Up   Current Method Hormonal Injection    End Method Oral Contraceptive    Contraception Counseling Provided Yes            Examination chaperoned by 11/04/21 LPN  Assessment:     1. LLQ pain Has appt 10/04/21 at [facility]  2. Encounter for initial prescription of contraceptive pills Given 1 pack lo Loestrin to start today Meds ordered this encounter  Medications   Norethindrone-Ethinyl Estradiol-Fe Biphas (LO LOESTRIN FE) 1  MG-10 MCG / 10 MCG tablet    Sig: Take 1 tablet by mouth daily. Take 1 daily by mouth    Dispense:  28 tablet    Refill:  11    [facility], PCN CN, GRP [id number]    Order Specific Question:   Supervising Provider    Answer:   [id number] H [[id number]]   dicyclomine (BENTYL) 20 MG tablet    Sig: Take 1 tablet (20 mg total) by mouth 4 (four) times daily -  before meals and at bedtime.    Dispense:  120 tablet    Refill:  3    Order Specific Question:   Supervising Provider    Answer:   Duane Lope, LUTHER H [[id number]]     3. Screening examination for STD (sexually transmitted disease) CV swab sent for GC/CHL,trich,BV and yeast   4. Irregular intermenstrual bleeding   5. Irritable bowel syndrome with diarrhea Will refill bentyl  Appt with GI 10/04/21    Plan:     Return in 5 months for pap and physical and ROS

## 2021-09-06 ENCOUNTER — Ambulatory Visit: Payer: BC Managed Care – PPO

## 2021-09-06 ENCOUNTER — Other Ambulatory Visit: Payer: Self-pay | Admitting: Adult Health

## 2021-09-06 LAB — CERVICOVAGINAL ANCILLARY ONLY
Bacterial Vaginitis (gardnerella): NEGATIVE
Candida Glabrata: NEGATIVE
Candida Vaginitis: NEGATIVE
Chlamydia: NEGATIVE
Comment: NEGATIVE
Comment: NEGATIVE
Comment: NEGATIVE
Comment: NEGATIVE
Comment: NEGATIVE
Comment: NORMAL
Neisseria Gonorrhea: NEGATIVE
Trichomonas: NEGATIVE

## 2021-09-06 MED ORDER — NORETHIN ACE-ETH ESTRAD-FE 1-20 MG-MCG PO TABS: 1.0000 | ORAL_TABLET | Freq: Every day | ORAL | 11 refills | Status: DC

## 2021-09-06 NOTE — Progress Notes (Signed)
Will rx junel 1/20 as lo loestrin not covered by insurance

## 2021-09-09 ENCOUNTER — Emergency Department (HOSPITAL_COMMUNITY): Payer: BC Managed Care – PPO

## 2021-09-09 ENCOUNTER — Encounter (HOSPITAL_COMMUNITY): Payer: Self-pay

## 2021-09-09 ENCOUNTER — Emergency Department (HOSPITAL_COMMUNITY)
Admission: EM | Admit: 2021-09-09 | Discharge: 2021-09-09 | Disposition: A | Discharge status: Home / Self Care | Payer: BC Managed Care – PPO | Attending: Emergency Medicine | Admitting: Emergency Medicine

## 2021-09-09 ENCOUNTER — Other Ambulatory Visit: Payer: Self-pay

## 2021-09-09 DIAGNOSIS — S99922A Unspecified injury of left foot, initial encounter: Secondary | ICD-10-CM | POA: Diagnosis not present

## 2021-09-09 DIAGNOSIS — M79673 Pain in unspecified foot: Secondary | ICD-10-CM

## 2021-09-09 DIAGNOSIS — S92535A Nondisplaced fracture of distal phalanx of left lesser toe(s), initial encounter for closed fracture: Secondary | ICD-10-CM | POA: Diagnosis not present

## 2021-09-09 DIAGNOSIS — W208XXA Other cause of strike by thrown, projected or falling object, initial encounter: Secondary | ICD-10-CM | POA: Insufficient documentation

## 2021-09-09 NOTE — ED Provider Notes (Signed)
[facility] EMERGENCY DEPARTMENT Provider Note   CSN: [id number] Arrival date & time: 09/09/21  0700     History Chief Complaint  Patient presents with   Foot Pain    Morgan [name] is a 25 y.o. female with no reported medical history.  Presents to the emergency department with a chief complaint of left foot pain.  Patient reports that at approximately 2130 last night a piece of marble fell off of her sink and landed on her left foot.  Patient reports that she has had pain constantly since then.  Patient rates pain 10/on the pain scale.  Pain is worse with weightbearing and touch.  Patient reports that she had minimal relief with Tylenol.  Patient reports difficulty moving left fifth toe due to complaints of pain.  Patient denies any color change, numbness, wounds.   Foot Pain      Past Medical History:  Diagnosis Date   Chlamydia 01/28/2020   Treated 01/28/20, POC___________   Contraceptive management 04/25/2016    Patient Active Problem List   Diagnosis Date Noted   Irregular intermenstrual bleeding 09/04/2021   Encounter for initial prescription of contraceptive pills 09/04/2021   Irritable bowel syndrome with diarrhea 06/06/2021   Diarrhea 05/30/2021   History of PID 05/30/2021   LLQ pain 05/30/2021   Yeast infection 05/08/2021   Swelling of vulva 07/07/2020   Vaginal itching 04/21/2020   History of chlamydia 04/21/2020   Vaginal odor 04/21/2020   Vaginal discharge 04/21/2020   Chlamydia 01/28/2020   Screening examination for STD (sexually transmitted disease) 11/17/2019   Dysuria 11/17/2019   Pelvic pain 11/17/2019   RUQ pain 11/17/2019   Right flank pain 11/17/2019   PID (acute pelvic inflammatory disease) 11/17/2019   Mass of breast, right 10/09/2019   Nipple discharge 10/09/2019   Breast pain 10/09/2019   Encounter for initial prescription of injectable contraceptive 02/04/2019   Encounter for gynecological examination with Papanicolaou  smear of cervix 02/04/2019   Urinary tract infection without hematuria 04/09/2017   BV (bacterial vaginosis) 04/09/2017   Pyelonephritis, acute 11/07/2016   GERD (gastroesophageal reflux disease) 11/18/2013   Radial styloid fracture 02/24/2013   Scaphoid fracture of wrist 02/24/2013   CLOSED FRACTURE OF CUBOID BONE 11/07/2009    Past Surgical History:  Procedure Laterality Date   ROOT CANAL  03/11/2017     OB History     Gravida  0   Para  0   Term  0   Preterm  0   AB  0   Living  0      SAB  0   IAB  0   Ectopic  0   Multiple  0   Live Births              Family History  Problem Relation Age of Onset   Diabetes Paternal Aunt    Cancer Paternal Aunt    Alzheimer's disease Paternal Aunt    Diabetes Maternal Grandmother    Cancer Paternal Grandmother    Stroke Paternal Grandmother    Heart attack Paternal Grandmother    Diabetes Paternal Grandmother    Diabetes Other     Social History   Tobacco Use   Smoking status: Never   Smokeless tobacco: Never  Vaping Use   Vaping Use: Former  Substance Use Topics   Alcohol use: No   Drug use: Yes    Types: Marijuana    Comment: not often    Home  Medications Prior to Admission medications   Medication Sig Start Date End Date Taking? Authorizing Provider  dicyclomine (BENTYL) 20 MG tablet Take 1 tablet (20 mg total) by mouth 4 (four) times daily -  before meals and at bedtime. 09/04/21   Adline Potter, NP  norethindrone-ethinyl estradiol-FE (LOESTRIN FE) 1-20 MG-MCG tablet Take 1 tablet by mouth daily. 09/06/21 09/06/22  Adline Potter, NP    Allergies    Patient has no known allergies.  Review of Systems   Review of Systems  Musculoskeletal:  Positive for arthralgias and myalgias.  Skin:  Negative for color change, pallor, rash and wound.  Neurological:  Negative for weakness and numbness.   Physical Exam Updated Vital Signs BP 123/68   Pulse 87   Resp 16   SpO2 100%    Physical Exam Vitals and nursing note reviewed.  Constitutional:      General: She is not in acute distress.    Appearance: She is not ill-appearing, toxic-appearing or diaphoretic.  HENT:     Head: Normocephalic.  Eyes:     General: No scleral icterus.       Right eye: No discharge.        Left eye: No discharge.  Cardiovascular:     Rate and Rhythm: Normal rate.     Pulses:          Dorsalis pedis pulses are 2+ on the right side and 2+ on the left side.  Pulmonary:     Effort: Pulmonary effort is normal.  Musculoskeletal:     Right foot: Normal range of motion and normal capillary refill. No swelling, deformity, tenderness or bony tenderness.     Left foot: Normal range of motion and normal capillary refill. Swelling, tenderness and bony tenderness present. No deformity.     Comments: Sensation intact to all digits of left foot.  Patient noted to have swelling and tenderness throughout left fifth toe.  Feet:     Right foot:     Skin integrity: Skin integrity normal.     Toenail Condition: Right toenails are normal.     Left foot:     Skin integrity: Skin integrity normal.     Toenail Condition: Left toenails are normal.  Skin:    General: Skin is warm and dry.  Neurological:     General: No focal deficit present.     Mental Status: She is alert.     GCS: GCS eye subscore is 4. GCS verbal subscore is 5. GCS motor subscore is 6.  Psychiatric:        Behavior: Behavior is cooperative.    ED Results / Procedures / Treatments   Labs (all labs ordered are listed, but only abnormal results are displayed) Labs Reviewed - No data to display  EKG None  Radiology DG Foot Complete Left  Result Date: 09/09/2021 CLINICAL DATA:  Foot injury, posttraumatic EXAM: LEFT FOOT - COMPLETE 3+ VIEW COMPARISON:  None. FINDINGS: Nondisplaced fracture through the distal phalanx of the little digit. No dislocation or opaque foreign body IMPRESSION: Nondisplaced distal phalanx fracture of  the little digit. Electronically Signed   By: Tiburcio Pea M.D.   On: 09/09/2021 07:54    Procedures Procedures   Medications Ordered in ED Medications - No data to display  ED Course  I have reviewed the triage vital signs and the nursing notes.  Pertinent labs & imaging results that were available during my care of the patient were reviewed by me  and considered in my medical decision making (see chart for details).    MDM Rules/Calculators/A&P                           Alert 25 year old female no acute distress, nontoxic-appearing.  Presents emergency department with left foot pain after foot injury.  Sensation intact to all digits of the left foot.  Patient has swelling and tenderness to left fifth toe.  X-ray imaging shows nondisplaced distal phalanx fracture of left fifth toe.  Will buddy tape toe and place patient in postop shoe.  Patient to use these modalities for 4 to 6 weeks or until point tenderness resolves.  Patient given information for orthopedic follow-up.    Discussed results, findings, treatment and follow up. Patient advised of return precautions. Patient verbalized understanding and agreed with plan.   Final Clinical Impression(s) / ED Diagnoses Final diagnoses:  Foot pain    Rx / DC Orders ED Discharge Orders     None        Berneice Heinrich 09/09/21 7001    Margarita Grizzle, MD 09/10/21 [phone]

## 2021-09-09 NOTE — ED Notes (Signed)
Discharge instructions reviewed with patient and family member. Patient and family member verbalized understanding of instructions. Follow-up care and medications were reviewed. Patient ambulatory with crutches. VSS upon discharge.

## 2021-09-09 NOTE — Discharge Instructions (Addendum)
You came to the emergency department today to be evaluated for your left foot pain.  Your x-ray showed a nondisplaced distal phalanx fracture of the fifth toe.  Because of this I have placed you in buddy tape and a postop shoe. The injured toe should be immobilized until point tenderness has resolved, which usually requires four to six weeks.  Please rest, elevate, and ice your affected foot over the next few days to decrease inflammation, swelling, and pain.  Apply ice for 20 minutes at a time and allow 20 minutes before reapplying.  I have given you information to follow-up with an orthopedic specialist if you have continued pain or discomfort due to your fracture.  Please take Ibuprofen (Advil, motrin) and Tylenol (acetaminophen) to relieve your pain.    You may take up to 600 MG (3 pills) of normal strength ibuprofen every 8 hours as needed.   You make take tylenol, up to 1,000 mg (two extra strength pills) every 8 hours as needed.   It is safe to take ibuprofen and tylenol at the same time as they work differently.   Do not take more than 3,000 mg tylenol in a 24 hour period (not more than one dose every 8 hours.  Please check all medication labels as many medications such as pain and cold medications may contain tylenol.  Do not drink alcohol while taking these medications.  Do not take other NSAID'S while taking ibuprofen (such as aleve or naproxen).  Please take ibuprofen with food to decrease stomach upset.  Get help right away if you have: Any of the following in your toes or your foot: Numbness that gets worse. Tingling. Coldness. Blue skin. Redness or swelling that gets worse. Pain that suddenly becomes severe.

## 2021-09-09 NOTE — ED Triage Notes (Signed)
A piece of granite fell on left foot last night. Pt is unable to move foot this am.

## 2021-09-12 ENCOUNTER — Ambulatory Visit: Payer: Self-pay

## 2021-09-13 DIAGNOSIS — M79675 Pain in left toe(s): Secondary | ICD-10-CM | POA: Diagnosis not present

## 2021-09-20 ENCOUNTER — Encounter: Payer: Self-pay | Admitting: Emergency Medicine

## 2021-09-20 ENCOUNTER — Ambulatory Visit
Admission: EM | Admit: 2021-09-20 | Discharge: 2021-09-20 | Disposition: A | Discharge status: Home / Self Care | Payer: BC Managed Care – PPO | Attending: Physician Assistant | Admitting: Physician Assistant

## 2021-09-20 ENCOUNTER — Ambulatory Visit: Payer: Self-pay

## 2021-09-20 ENCOUNTER — Other Ambulatory Visit: Payer: Self-pay

## 2021-09-20 DIAGNOSIS — Z20822 Contact with and (suspected) exposure to covid-19: Secondary | ICD-10-CM | POA: Diagnosis not present

## 2021-09-20 MED ORDER — IBUPROFEN 800 MG PO TABS
800.0000 mg | ORAL_TABLET | Freq: Once | ORAL | Status: AC
  Administered 2021-09-20: 800 mg via ORAL

## 2021-09-20 NOTE — ED Triage Notes (Signed)
Chills and aching x 3 days. States she has a headache and feels week.

## 2021-09-21 LAB — COVID-19, FLU A+B NAA
Influenza A, NAA: NOT DETECTED
Influenza B, NAA: NOT DETECTED
SARS-CoV-2, NAA: DETECTED — AB

## 2021-09-26 NOTE — ED Provider Notes (Signed)
[facility]    CSN: [id number] Arrival date & time: 09/20/21  1755      History   Chief Complaint No chief complaint on file.   HPI Morgan [name] is a 25 y.o. female.   The history is provided by the patient. No language interpreter was used.  Fever Severity:  Moderate Onset quality:  Gradual Timing:  Constant Progression:  Worsening Chronicity:  New Relieved by:  Nothing Worsened by:  Nothing Associated symptoms: cough   Risk factors: sick contacts    Past Medical History:  Diagnosis Date   Chlamydia 01/28/2020   Treated 01/28/20, POC___________   Contraceptive management 04/25/2016    Patient Active Problem List   Diagnosis Date Noted   Irregular intermenstrual bleeding 09/04/2021   Encounter for initial prescription of contraceptive pills 09/04/2021   Irritable bowel syndrome with diarrhea 06/06/2021   Diarrhea 05/30/2021   History of PID 05/30/2021   LLQ pain 05/30/2021   Yeast infection 05/08/2021   Swelling of vulva 07/07/2020   Vaginal itching 04/21/2020   History of chlamydia 04/21/2020   Vaginal odor 04/21/2020   Vaginal discharge 04/21/2020   Chlamydia 01/28/2020   Screening examination for STD (sexually transmitted disease) 11/17/2019   Dysuria 11/17/2019   Pelvic pain 11/17/2019   RUQ pain 11/17/2019   Right flank pain 11/17/2019   PID (acute pelvic inflammatory disease) 11/17/2019   Mass of breast, right 10/09/2019   Nipple discharge 10/09/2019   Breast pain 10/09/2019   Encounter for initial prescription of injectable contraceptive 02/04/2019   Encounter for gynecological examination with Papanicolaou smear of cervix 02/04/2019   Urinary tract infection without hematuria 04/09/2017   BV (bacterial vaginosis) 04/09/2017   Pyelonephritis, acute 11/07/2016   GERD (gastroesophageal reflux disease) 11/18/2013   Radial styloid fracture 02/24/2013   Scaphoid fracture of wrist 02/24/2013   CLOSED FRACTURE OF CUBOID BONE 11/07/2009     Past Surgical History:  Procedure Laterality Date   ROOT CANAL  03/11/2017    OB History     Gravida  0   Para  0   Term  0   Preterm  0   AB  0   Living  0      SAB  0   IAB  0   Ectopic  0   Multiple  0   Live Births               Home Medications    Prior to Admission medications   Medication Sig Start Date End Date Taking? Authorizing Provider  dicyclomine (BENTYL) 20 MG tablet Take 1 tablet (20 mg total) by mouth 4 (four) times daily -  before meals and at bedtime. 09/04/21   Adline Potter, NP  norethindrone-ethinyl estradiol-FE (LOESTRIN FE) 1-20 MG-MCG tablet Take 1 tablet by mouth daily. 09/06/21 09/06/22  Adline Potter, NP    Family History Family History  Problem Relation Age of Onset   Diabetes Paternal Aunt    Cancer Paternal Aunt    Alzheimer's disease Paternal Aunt    Diabetes Maternal Grandmother    Cancer Paternal Grandmother    Stroke Paternal Grandmother    Heart attack Paternal Grandmother    Diabetes Paternal Grandmother    Diabetes Other     Social History Social History   Tobacco Use   Smoking status: Never   Smokeless tobacco: Never  Vaping Use   Vaping Use: Former  Substance Use Topics   Alcohol use: No  Drug use: Yes    Types: Marijuana    Comment: not often     Allergies   Patient has no known allergies.   Review of Systems Review of Systems  Constitutional:  Positive for fever.  Respiratory:  Positive for cough.   All other systems reviewed and are negative.   Physical Exam Triage Vital Signs ED Triage Vitals  Enc Vitals Group     BP 09/20/21 1814 120/74     Pulse Rate 09/20/21 1814 79     Resp 09/20/21 1814 18     Temp 09/20/21 1814 99.3 F (37.4 C)     Temp Source 09/20/21 1814 Temporal     SpO2 09/20/21 1814 100 %     Weight --      Height --      Head Circumference --      Peak Flow --      Pain Score 09/20/21 1815 9     Pain Loc --      Pain Edu? --      Excl. in GC?  --    No data found.  Updated Vital Signs BP 120/74 (BP Location: Right Arm)   Pulse 79   Temp 99.3 F (37.4 C) (Temporal)   Resp 18   SpO2 100%   Visual Acuity Right Eye Distance:   Left Eye Distance:   Bilateral Distance:    Right Eye Near:   Left Eye Near:    Bilateral Near:     Physical Exam Vitals and nursing note reviewed.  Constitutional:      Appearance: She is well-developed.  HENT:     Head: Normocephalic.  Cardiovascular:     Rate and Rhythm: Normal rate.  Pulmonary:     Effort: Pulmonary effort is normal.  Abdominal:     General: There is no distension.  Musculoskeletal:        General: Normal range of motion.     Cervical back: Normal range of motion.  Neurological:     Mental Status: She is alert and oriented to person, place, and time.     UC Treatments / Results  Labs (all labs ordered are listed, but only abnormal results are displayed) Labs Reviewed  COVID-19, FLU A+B NAA - Abnormal; Notable for the following components:      Result Value   SARS-CoV-2, NAA Detected (*)    All other components within normal limits   Narrative:    Performed at:  [address] [place], [place]  [id number] Lab Director: Jolene Schimke MD, Phone:  [phone]    EKG   Radiology No results found.  Procedures Procedures (including critical care time)  Medications Ordered in UC Medications  ibuprofen (ADVIL) tablet 800 mg (800 mg Oral Given 09/20/21 1844)    Initial Impression / Assessment and Plan / UC Course  I have reviewed the triage vital signs and the nursing notes.  Pertinent labs & imaging results that were available during my care of the patient were reviewed by me and considered in my medical decision making (see chart for details).      Final Clinical Impressions(s) / UC Diagnoses   Final diagnoses:  Exposure to COVID-19 virus   Discharge Instructions   None    ED Prescriptions   None    PDMP not  reviewed this encounter.   Elson Areas, [place] 09/26/21 1447

## 2021-10-04 ENCOUNTER — Ambulatory Visit: Payer: BC Managed Care – PPO | Admitting: Gastroenterology

## 2021-10-04 ENCOUNTER — Encounter: Payer: Self-pay | Admitting: Internal Medicine

## 2021-10-11 DIAGNOSIS — S92532A Displaced fracture of distal phalanx of left lesser toe(s), initial encounter for closed fracture: Secondary | ICD-10-CM | POA: Diagnosis not present

## 2021-10-23 DIAGNOSIS — S92532A Displaced fracture of distal phalanx of left lesser toe(s), initial encounter for closed fracture: Secondary | ICD-10-CM | POA: Diagnosis not present

## 2021-10-29 ENCOUNTER — Ambulatory Visit: Payer: Self-pay

## 2021-11-02 ENCOUNTER — Ambulatory Visit: Payer: Self-pay

## 2021-11-04 ENCOUNTER — Other Ambulatory Visit: Payer: Self-pay

## 2021-11-04 ENCOUNTER — Ambulatory Visit
Admission: EM | Admit: 2021-11-04 | Discharge: 2021-11-04 | Disposition: A | Discharge status: Home / Self Care | Payer: BC Managed Care – PPO | Attending: Urgent Care | Admitting: Urgent Care

## 2021-11-04 ENCOUNTER — Ambulatory Visit (INDEPENDENT_AMBULATORY_CARE_PROVIDER_SITE_OTHER): Payer: BC Managed Care – PPO

## 2021-11-04 DIAGNOSIS — J018 Other acute sinusitis: Secondary | ICD-10-CM

## 2021-11-04 DIAGNOSIS — R059 Cough, unspecified: Secondary | ICD-10-CM | POA: Diagnosis not present

## 2021-11-04 DIAGNOSIS — R0981 Nasal congestion: Secondary | ICD-10-CM

## 2021-11-04 DIAGNOSIS — R042 Hemoptysis: Secondary | ICD-10-CM | POA: Diagnosis not present

## 2021-11-04 DIAGNOSIS — R053 Chronic cough: Secondary | ICD-10-CM

## 2021-11-04 MED ORDER — PSEUDOEPHEDRINE HCL 60 MG PO TABS: 60.0000 mg | ORAL_TABLET | Freq: Three times a day (TID) | ORAL | 0 refills | Status: DC | PRN

## 2021-11-04 MED ORDER — CETIRIZINE HCL 10 MG PO TABS: 10.0000 mg | ORAL_TABLET | Freq: Every day | ORAL | 0 refills | Status: DC

## 2021-11-04 MED ORDER — AMOXICILLIN-POT CLAVULANATE 875-125 MG PO TABS: 1.0000 | ORAL_TABLET | Freq: Two times a day (BID) | ORAL | 0 refills | Status: DC

## 2021-11-04 MED ORDER — BENZONATATE 100 MG PO CAPS: 100.0000 mg | ORAL_CAPSULE | Freq: Three times a day (TID) | ORAL | 0 refills | Status: DC | PRN

## 2021-11-04 MED ORDER — PROMETHAZINE-DM 6.25-15 MG/5ML PO SYRP: 5.0000 mL | ORAL_SOLUTION | Freq: Every evening | ORAL | 0 refills | Status: DC | PRN

## 2021-11-04 NOTE — ED Triage Notes (Signed)
Pt reports having congestion and cough for past 3 weeks with report coughing up blood

## 2021-11-04 NOTE — ED Provider Notes (Addendum)
[facility]   MRN: [id number] DOB: 29-Jun-1996  Subjective:   Morgan [name] is a 25 y.o. female presenting for 2-week history of persistent malaise, sinus congestion, coughing, drainage.  Reports that she became concerned today because she started coughing up some blood.  No history of blood clots, no chest pain, shortness of breath or wheezing.  No current facility-administered medications for this encounter.  Current Outpatient Medications:    dicyclomine (BENTYL) 20 MG tablet, Take 1 tablet (20 mg total) by mouth 4 (four) times daily -  before meals and at bedtime., Disp: 120 tablet, Rfl: 3   norethindrone-ethinyl estradiol-FE (LOESTRIN FE) 1-20 MG-MCG tablet, Take 1 tablet by mouth daily., Disp: 28 tablet, Rfl: 11   No Known Allergies  Past Medical History:  Diagnosis Date   Chlamydia 01/28/2020   Treated 01/28/20, POC___________   Contraceptive management 04/25/2016     Past Surgical History:  Procedure Laterality Date   ROOT CANAL  03/11/2017    Family History  Problem Relation Age of Onset   Diabetes Paternal Aunt    Cancer Paternal Aunt    Alzheimer's disease Paternal Aunt    Diabetes Maternal Grandmother    Cancer Paternal Grandmother    Stroke Paternal Grandmother    Heart attack Paternal Grandmother    Diabetes Paternal Grandmother    Diabetes Other     Social History   Tobacco Use   Smoking status: Never   Smokeless tobacco: Never  Vaping Use   Vaping Use: Former  Substance Use Topics   Alcohol use: No   Drug use: Yes    Types: Marijuana    Comment: not often    ROS   Objective:   Vitals: BP 107/71   Pulse 65   Temp 98.9 F (37.2 C)   Resp 20   SpO2 97%   Physical Exam Constitutional:      General: She is not in acute distress.    Appearance: Normal appearance. She is well-developed. She is not ill-appearing, toxic-appearing or diaphoretic.  HENT:     Head: Normocephalic and atraumatic.     Right Ear: Tympanic  membrane and ear canal normal. No drainage or tenderness. No middle ear effusion. Tympanic membrane is not erythematous.     Left Ear: Tympanic membrane and ear canal normal. No drainage or tenderness.  No middle ear effusion. Tympanic membrane is not erythematous.     Nose: Congestion and rhinorrhea present.     Mouth/Throat:     Mouth: Mucous membranes are moist. No oral lesions.     Pharynx: No pharyngeal swelling, oropharyngeal exudate, posterior oropharyngeal erythema or uvula swelling.     Tonsils: No tonsillar exudate or tonsillar abscesses.  Eyes:     General: No scleral icterus.    Extraocular Movements: Extraocular movements intact.     Right eye: Normal extraocular motion.     Left eye: Normal extraocular motion.     Conjunctiva/sclera: Conjunctivae normal.     Pupils: Pupils are equal, round, and reactive to light.  Cardiovascular:     Rate and Rhythm: Normal rate and regular rhythm.     Pulses: Normal pulses.     Heart sounds: Normal heart sounds. No murmur heard.   No friction rub. No gallop.  Pulmonary:     Effort: Pulmonary effort is normal. No respiratory distress.     Breath sounds: Normal breath sounds. No stridor. No wheezing, rhonchi or rales.  Musculoskeletal:     Cervical back: Normal range  of motion and neck supple.  Lymphadenopathy:     Cervical: No cervical adenopathy.  Skin:    General: Skin is warm and dry.     Findings: No rash.  Neurological:     General: No focal deficit present.     Mental Status: She is alert and oriented to person, place, and time.  Psychiatric:        Mood and Affect: Mood normal.        Behavior: Behavior normal.        Thought Content: Thought content normal.    DG Chest 2 View  Result Date: 11/04/2021 CLINICAL DATA:  Coughing up blood for 2 weeks. EXAM: CHEST - 2 VIEW COMPARISON:  October 08, 2016 FINDINGS: The heart size and mediastinal contours are within normal limits. Both lungs are clear. The visualized skeletal  structures are unremarkable. IMPRESSION: No active cardiopulmonary disease. Electronically Signed   By: Sherian Rein M.D.   On: 11/04/2021 12:25     Assessment and Plan :   PDMP not reviewed this encounter.  1. Acute non-recurrent sinusitis of other sinus   2. Sinus congestion   3. Hemoptysis   4. Persistent cough    Low suspicion for pulmonary embolism, low Wells criteria.  Will start empiric treatment for sinusitis with Augmentin.  Recommended supportive care otherwise including the use of oral antihistamine, decongestant. Counseled patient on potential for adverse effects with medications prescribed/recommended today, ER and return-to-clinic precautions discussed, patient verbalized understanding.      Wallis Bamberg, PA-C 11/04/21 1244

## 2021-11-05 ENCOUNTER — Telehealth: Payer: Self-pay | Admitting: Emergency Medicine

## 2021-11-06 DIAGNOSIS — Z0001 Encounter for general adult medical examination with abnormal findings: Secondary | ICD-10-CM | POA: Diagnosis not present

## 2021-11-06 DIAGNOSIS — E559 Vitamin D deficiency, unspecified: Secondary | ICD-10-CM | POA: Diagnosis not present

## 2021-11-06 DIAGNOSIS — Z13220 Encounter for screening for lipoid disorders: Secondary | ICD-10-CM | POA: Diagnosis not present

## 2021-11-06 DIAGNOSIS — Z0000 Encounter for general adult medical examination without abnormal findings: Secondary | ICD-10-CM | POA: Diagnosis not present

## 2021-11-24 DIAGNOSIS — R509 Fever, unspecified: Secondary | ICD-10-CM | POA: Diagnosis not present

## 2021-11-24 DIAGNOSIS — B349 Viral infection, unspecified: Secondary | ICD-10-CM | POA: Diagnosis not present

## 2021-12-25 ENCOUNTER — Encounter: Payer: Self-pay | Admitting: Emergency Medicine

## 2021-12-25 ENCOUNTER — Ambulatory Visit
Admission: EM | Admit: 2021-12-25 | Discharge: 2021-12-25 | Disposition: A | Discharge status: Home / Self Care | Payer: BC Managed Care – PPO | Attending: Urgent Care | Admitting: Urgent Care

## 2021-12-25 ENCOUNTER — Other Ambulatory Visit: Payer: Self-pay

## 2021-12-25 DIAGNOSIS — N760 Acute vaginitis: Secondary | ICD-10-CM | POA: Diagnosis not present

## 2021-12-25 DIAGNOSIS — B9689 Other specified bacterial agents as the cause of diseases classified elsewhere: Secondary | ICD-10-CM | POA: Insufficient documentation

## 2021-12-25 DIAGNOSIS — B3731 Acute candidiasis of vulva and vagina: Secondary | ICD-10-CM | POA: Diagnosis not present

## 2021-12-25 LAB — POCT URINALYSIS DIP (MANUAL ENTRY)
Bilirubin, UA: NEGATIVE
Blood, UA: NEGATIVE
Glucose, UA: NEGATIVE mg/dL
Leukocytes, UA: NEGATIVE
Nitrite, UA: NEGATIVE
Protein Ur, POC: NEGATIVE mg/dL
Spec Grav, UA: 1.025 (ref 1.010–1.025)
Urobilinogen, UA: 0.2 E.U./dL
pH, UA: 5 (ref 5.0–8.0)

## 2021-12-25 LAB — POCT URINE PREGNANCY: Preg Test, Ur: NEGATIVE

## 2021-12-25 MED ORDER — FLUCONAZOLE 150 MG PO TABS: 150.0000 mg | ORAL_TABLET | ORAL | 0 refills | Status: DC

## 2021-12-25 MED ORDER — METRONIDAZOLE 500 MG PO TABS: 500.0000 mg | ORAL_TABLET | Freq: Two times a day (BID) | ORAL | 0 refills | Status: DC

## 2021-12-25 NOTE — ED Provider Notes (Signed)
[facility]   MRN: [id number] DOB: 09-May-1996  Subjective:   Morgan [name] is a 26 y.o. female presenting for 4-day history of acute onset recurrent vaginal irritation, vaginal burning, vaginal discharge that is clear.  She did use a new soap and thinks this might be related.  No fever, nausea, vomiting, genital rash.  Patient is not opposed to getting checked for STIs.  Also is not opposed to a urine pregnancy test.  She does have difficulty with yeast and BV infections, has had multiple this year.  No current facility-administered medications for this encounter.  Current Outpatient Medications:    amoxicillin-clavulanate (AUGMENTIN) 875-125 MG tablet, Take 1 tablet by mouth 2 (two) times daily., Disp: 14 tablet, Rfl: 0   benzonatate (TESSALON) 100 MG capsule, Take 1-2 capsules (100-200 mg total) by mouth 3 (three) times daily as needed for cough., Disp: 60 capsule, Rfl: 0   cetirizine (ZYRTEC ALLERGY) 10 MG tablet, Take 1 tablet (10 mg total) by mouth daily., Disp: 30 tablet, Rfl: 0   dicyclomine (BENTYL) 20 MG tablet, Take 1 tablet (20 mg total) by mouth 4 (four) times daily -  before meals and at bedtime., Disp: 120 tablet, Rfl: 3   norethindrone-ethinyl estradiol-FE (LOESTRIN FE) 1-20 MG-MCG tablet, Take 1 tablet by mouth daily., Disp: 28 tablet, Rfl: 11   promethazine-dextromethorphan (PROMETHAZINE-DM) 6.25-15 MG/5ML syrup, Take 5 mLs by mouth at bedtime as needed for cough., Disp: 100 mL, Rfl: 0   pseudoephedrine (SUDAFED) 60 MG tablet, Take 1 tablet (60 mg total) by mouth every 8 (eight) hours as needed for congestion., Disp: 30 tablet, Rfl: 0   No Known Allergies  Past Medical History:  Diagnosis Date   Chlamydia 01/28/2020   Treated 01/28/20, POC___________   Contraceptive management 04/25/2016     Past Surgical History:  Procedure Laterality Date   ROOT CANAL  03/11/2017    Family History  Problem Relation Age of Onset   Diabetes Paternal Aunt    Cancer  Paternal Aunt    Alzheimer's disease Paternal Aunt    Diabetes Maternal Grandmother    Cancer Paternal Grandmother    Stroke Paternal Grandmother    Heart attack Paternal Grandmother    Diabetes Paternal Grandmother    Diabetes Other     Social History   Tobacco Use   Smoking status: Never   Smokeless tobacco: Never  Vaping Use   Vaping Use: Former  Substance Use Topics   Alcohol use: No   Drug use: Yes    Types: Marijuana    Comment: not often    ROS   Objective:   Vitals: BP (!) 114/56    Pulse (!) 52    Temp 98.6 F (37 C) (Oral)    Resp 18    LMP  (LMP Unknown)    SpO2 98%   Physical Exam Constitutional:      General: She is not in acute distress.    Appearance: Normal appearance. She is well-developed. She is not ill-appearing, toxic-appearing or diaphoretic.  HENT:     Head: Normocephalic and atraumatic.     Nose: Nose normal.     Mouth/Throat:     Mouth: Mucous membranes are moist.     Pharynx: Oropharynx is clear.  Eyes:     General: No scleral icterus.       Right eye: No discharge.        Left eye: No discharge.     Extraocular Movements: Extraocular movements intact.  Conjunctiva/sclera: Conjunctivae normal.     Pupils: Pupils are equal, round, and reactive to light.  Cardiovascular:     Rate and Rhythm: Normal rate.  Pulmonary:     Effort: Pulmonary effort is normal.  Abdominal:     General: Bowel sounds are normal. There is no distension.     Palpations: Abdomen is soft. There is no mass.     Tenderness: There is abdominal tenderness (pelvic, suprapubic). There is no right CVA tenderness, left CVA tenderness, guarding or rebound.  Skin:    General: Skin is warm and dry.  Neurological:     General: No focal deficit present.     Mental Status: She is alert and oriented to person, place, and time.  Psychiatric:        Mood and Affect: Mood normal.        Behavior: Behavior normal.        Thought Content: Thought content normal.         Judgment: Judgment normal.    Results for orders placed or performed during the hospital encounter of 12/25/21 (from the past 24 hour(s))  POCT urinalysis dipstick     Status: Abnormal   Collection Time: 12/25/21  2:43 PM  Result Value Ref Range   Color, UA yellow yellow   Clarity, UA clear clear   Glucose, UA negative negative mg/dL   Bilirubin, UA negative negative   Ketones, POC UA trace (5) (A) negative mg/dL   Spec Grav, UA 9.024 0.973 - 1.025   Blood, UA negative negative   pH, UA 5.0 5.0 - 8.0   Protein Ur, POC negative negative mg/dL   Urobilinogen, UA 0.2 0.2 or 1.0 E.U./dL   Nitrite, UA Negative Negative   Leukocytes, UA Negative Negative  POCT urine pregnancy     Status: None   Collection Time: 12/25/21  2:44 PM  Result Value Ref Range   Preg Test, Ur Negative Negative    Assessment and Plan :   PDMP not reviewed this encounter.  1. Bacterial vaginosis   2. Yeast vaginitis    I do not suspect PID but advised that this is possible.  Will defer empiric treatment for now.  Will cover for recurrent BV and yeast vaginitis with metronidazole and Diflucan respectively.  Labs pending. Counseled patient on potential for adverse effects with medications prescribed/recommended today, ER and return-to-clinic precautions discussed, patient verbalized understanding.    Wallis Bamberg, PA-C 12/25/21 1500

## 2021-12-25 NOTE — ED Triage Notes (Signed)
Pt here with vaginal irritation, swelling, and discharge that is clear x 4 days. Pt noticed this after she took a bath with new soap. Pt endorses burning on urination as well.

## 2021-12-26 LAB — CERVICOVAGINAL ANCILLARY ONLY
Bacterial Vaginitis (gardnerella): NEGATIVE
Candida Glabrata: NEGATIVE
Candida Vaginitis: NEGATIVE
Chlamydia: NEGATIVE
Comment: NEGATIVE
Comment: NEGATIVE
Comment: NEGATIVE
Comment: NEGATIVE
Comment: NEGATIVE
Comment: NORMAL
Neisseria Gonorrhea: NEGATIVE
Trichomonas: NEGATIVE

## 2021-12-27 ENCOUNTER — Other Ambulatory Visit (HOSPITAL_COMMUNITY)
Admission: RE | Admit: 2021-12-27 | Discharge: 2021-12-27 | Disposition: A | Discharge status: Home / Self Care | Payer: BC Managed Care – PPO | Source: Ambulatory Visit | Attending: Adult Health | Admitting: Adult Health

## 2021-12-27 ENCOUNTER — Other Ambulatory Visit: Payer: Self-pay

## 2021-12-27 ENCOUNTER — Encounter: Payer: Self-pay | Admitting: Adult Health

## 2021-12-27 ENCOUNTER — Ambulatory Visit: Payer: BC Managed Care – PPO | Admitting: Adult Health

## 2021-12-27 VITALS — BP 103/59 | HR 80 | Ht 62.0 in | Wt 138.0 lb

## 2021-12-27 DIAGNOSIS — Z124 Encounter for screening for malignant neoplasm of cervix: Secondary | ICD-10-CM | POA: Insufficient documentation

## 2021-12-27 DIAGNOSIS — R102 Pelvic and perineal pain: Secondary | ICD-10-CM | POA: Diagnosis not present

## 2021-12-27 DIAGNOSIS — Z3201 Encounter for pregnancy test, result positive: Secondary | ICD-10-CM | POA: Diagnosis not present

## 2021-12-27 DIAGNOSIS — K580 Irritable bowel syndrome with diarrhea: Secondary | ICD-10-CM | POA: Diagnosis not present

## 2021-12-27 DIAGNOSIS — Z30013 Encounter for initial prescription of injectable contraceptive: Secondary | ICD-10-CM | POA: Diagnosis not present

## 2021-12-27 LAB — POCT URINE PREGNANCY: Preg Test, Ur: NEGATIVE

## 2021-12-27 MED ORDER — MEDROXYPROGESTERONE ACETATE 150 MG/ML IM SUSY: PREFILLED_SYRINGE | Freq: Once | INTRAMUSCULAR | Status: AC

## 2021-12-27 MED ORDER — DOXYCYCLINE HYCLATE 100 MG PO TABS: 100.0000 mg | ORAL_TABLET | Freq: Two times a day (BID) | ORAL | 0 refills | Status: DC

## 2021-12-27 MED ORDER — VALACYCLOVIR HCL 1 G PO TABS: 1000.0000 mg | ORAL_TABLET | Freq: Two times a day (BID) | ORAL | 1 refills | Status: DC

## 2021-12-27 MED ORDER — MEDROXYPROGESTERONE ACETATE 150 MG/ML IM SUSP: 150.0000 mg | INTRAMUSCULAR | 4 refills | Status: DC

## 2021-12-27 NOTE — Progress Notes (Signed)
Subjective:     Patient ID: Morgan [name], female   DOB: Aug 04, 1996, 26 y.o.   MRN: [id number]  HPI Morgan [name] is a 26 year old black female,single, G0P0 in complaining of vaginal pain/soreness, started after tub bath with some itching, and then felt dry with sex. Was seen at Urgent Care 12/25/21 and prescribed flagyl and diflucan. CV swab was negative. She needs pap. And she wants to get back on depo, forgets pills.   Review of Systems Vaginal pain/ soreness for several days now Has diarrhea after eating is on bentyl, not really helping  Reviewed past medical,surgical, social and family history. Reviewed medications and allergies.     Objective:   Physical Exam BP (!) 103/59 (BP Location: Left Arm, Patient Position: Sitting, Cuff Size: Normal)    Pulse 80    Ht 5\' 2"  (1.575 m)    Wt 138 lb (62.6 kg)    LMP 10/02/2021 (Approximate)    BMI 25.24 kg/m     UPT is negative. Skin warm and dry.Pelvic: external genitalia is normal in appearance no lesions, vagina:scant  discharge without odor, has redness left introitus area and tender, no vesicles or tears noted,urethra has no lesions or masses noted, cervix:smooth, no CMT and pap with GC/CHL performed, uterus: normal size, shape and contour, non tender, no masses felt, adnexa: no masses or tenderness noted. Bladder is non tender and no masses felt.   Upstream - 12/27/21 1127       Pregnancy Intention Screening   Does the patient want to become pregnant in the next year? No    Does the patient's partner want to become pregnant in the next year? No    Would the patient like to discuss contraceptive options today? Yes      Contraception Wrap Up   Current Method Oral Contraceptive    End Method Hormonal Injection    Contraception Counseling Provided Yes            Examination chaperoned by Levy Pupa LPN  Assessment:     1. Vaginal pain Will rx doxycyline and valtrex  Meds ordered this encounter  Medications   doxycycline  (VIBRA-TABS) 100 MG tablet    Sig: Take 1 tablet (100 mg total) by mouth 2 (two) times daily.    Dispense:  14 tablet    Refill:  0    Order Specific Question:   Supervising Provider    Answer:   Tania Ade H [[id number]]   valACYclovir (VALTREX) 1000 MG tablet    Sig: Take 1 tablet (1,000 mg total) by mouth 2 (two) times daily.    Dispense:  20 tablet    Refill:  1    Order Specific Question:   Supervising Provider    Answer:   Tania Ade H [[id number]]   medroxyPROGESTERone (DEPO-PROVERA) 150 MG/ML injection    Sig: Inject 1 mL (150 mg total) into the muscle every 3 (three) months.    Dispense:  1 mL    Refill:  4    Order Specific Question:   Supervising Provider    Answer:   Tania Ade H [[id number]]   medroxyPROGESTERone Acetate SUSY     2. Routine Papanicolaou smear Pap sent Pap in 3 years if normal  3. Irritable bowel syndrome with diarrhea Has GI appt in March   4. Encounter for initial prescription of injectable contraceptive Will get first depo today in office and Rx sent to [facility]:  Follow up with me in 2 weeks for recheck Depo in 12 weeks

## 2021-12-28 LAB — CYTOLOGY - PAP
Chlamydia: NEGATIVE
Comment: NEGATIVE
Comment: NORMAL
Diagnosis: NEGATIVE
Neisseria Gonorrhea: NEGATIVE

## 2022-01-01 DIAGNOSIS — K529 Noninfective gastroenteritis and colitis, unspecified: Secondary | ICD-10-CM | POA: Diagnosis not present

## 2022-01-01 DIAGNOSIS — K580 Irritable bowel syndrome with diarrhea: Secondary | ICD-10-CM | POA: Diagnosis not present

## 2022-01-01 DIAGNOSIS — R42 Dizziness and giddiness: Secondary | ICD-10-CM | POA: Diagnosis not present

## 2022-01-10 ENCOUNTER — Ambulatory Visit: Payer: BC Managed Care – PPO | Admitting: Adult Health

## 2022-01-15 ENCOUNTER — Ambulatory Visit: Payer: BC Managed Care – PPO | Admitting: Adult Health

## 2022-01-23 ENCOUNTER — Other Ambulatory Visit: Payer: Self-pay | Admitting: *Deleted

## 2022-01-23 ENCOUNTER — Encounter: Payer: Self-pay | Admitting: *Deleted

## 2022-01-24 ENCOUNTER — Ambulatory Visit: Payer: BC Managed Care – PPO | Admitting: Psychiatry

## 2022-02-09 IMAGING — DX DG CHEST 2V
2 series · 2 of 2 positions shown · non-contrast
Comparison: October 08, 2016

CLINICAL DATA: Coughing up blood for 2 weeks.

EXAM:
CHEST - 2 VIEW

[chest pa]
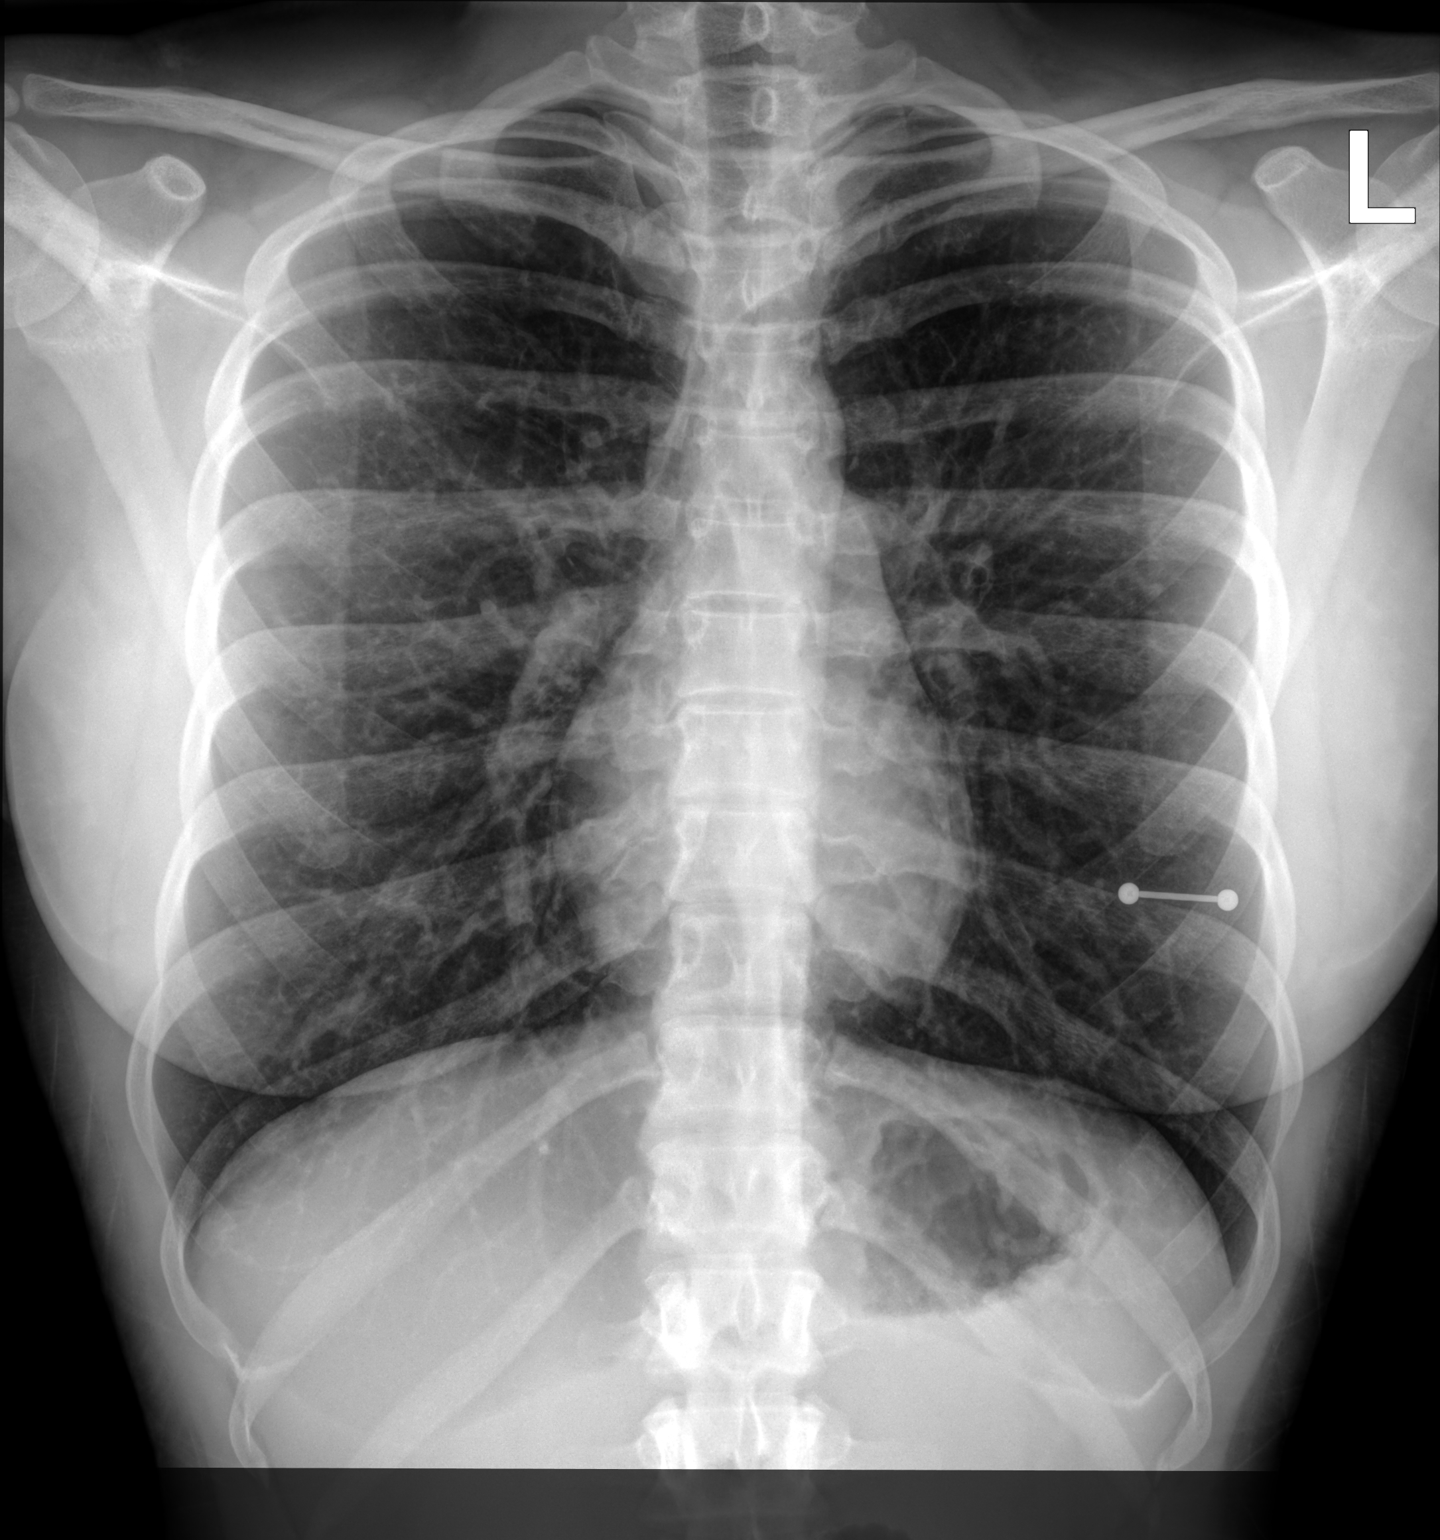

[chest lat]
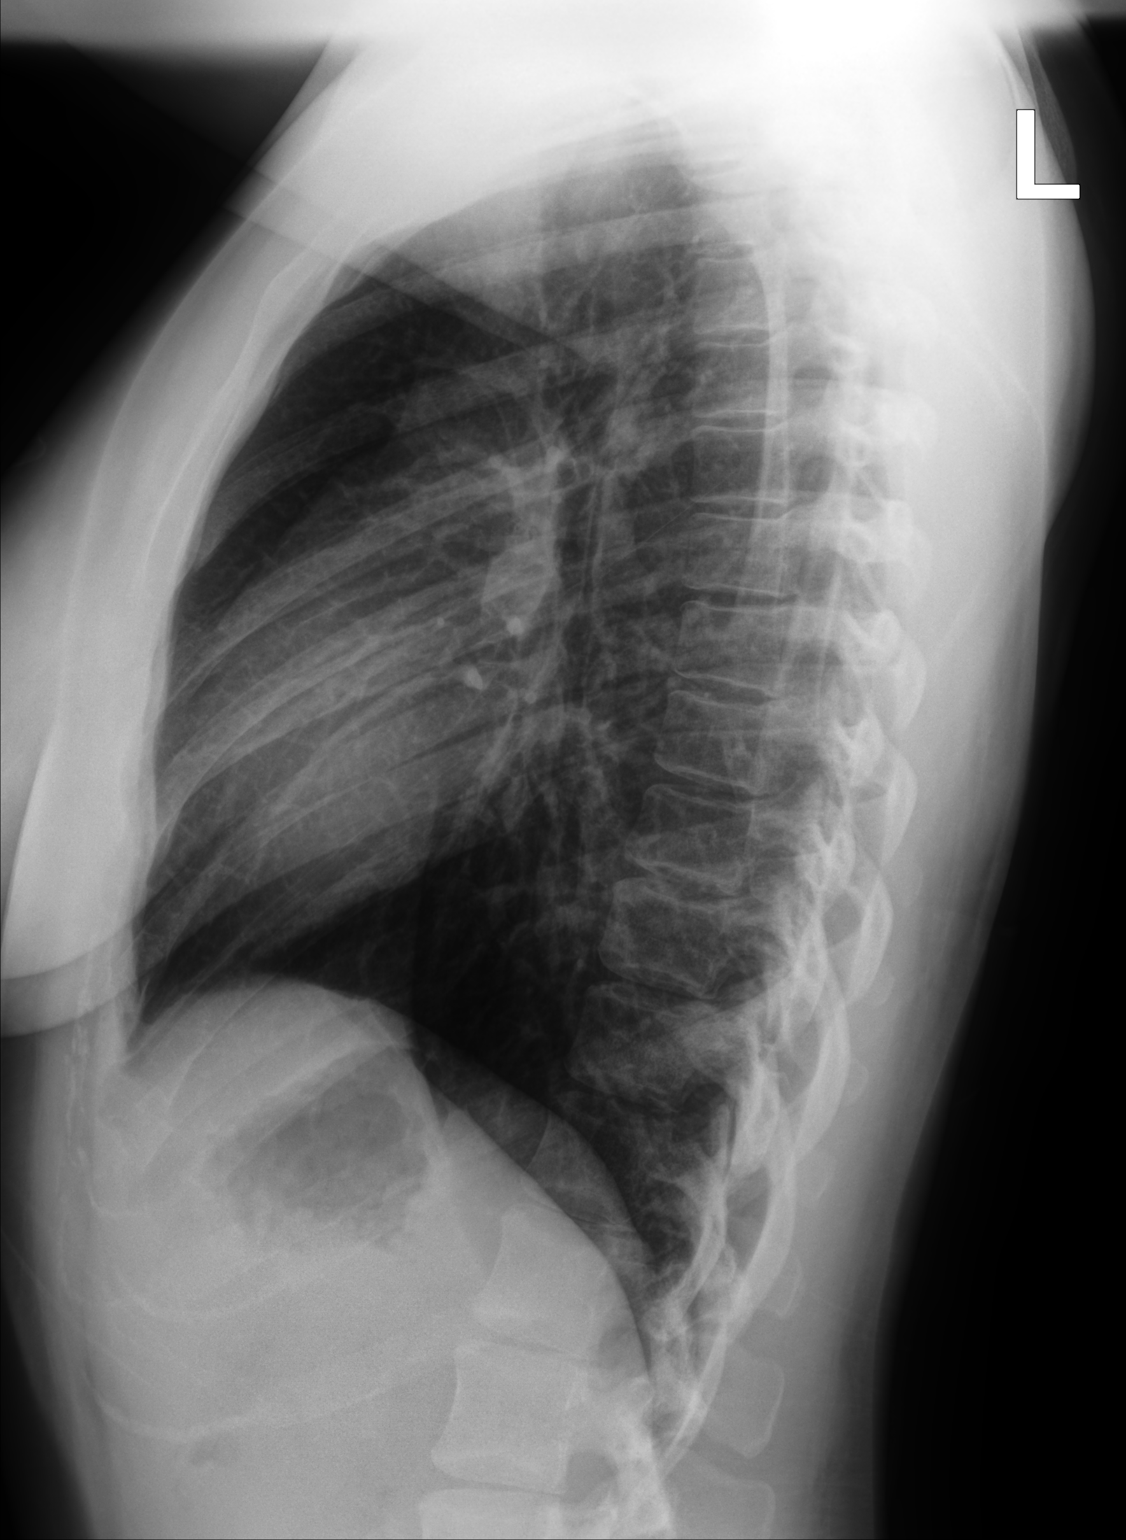

[2 of 2 positions shown; findings below may reference images not displayed]

FINDINGS: The heart size and mediastinal contours are within normal limits.
Both lungs are clear. The visualized skeletal structures are
unremarkable.
IMPRESSION: No active cardiopulmonary disease.

## 2022-02-12 DIAGNOSIS — R112 Nausea with vomiting, unspecified: Secondary | ICD-10-CM | POA: Diagnosis not present

## 2022-02-12 DIAGNOSIS — K219 Gastro-esophageal reflux disease without esophagitis: Secondary | ICD-10-CM | POA: Diagnosis not present

## 2022-02-12 DIAGNOSIS — R634 Abnormal weight loss: Secondary | ICD-10-CM | POA: Diagnosis not present

## 2022-02-12 DIAGNOSIS — R197 Diarrhea, unspecified: Secondary | ICD-10-CM | POA: Diagnosis not present

## 2022-02-12 DIAGNOSIS — R109 Unspecified abdominal pain: Secondary | ICD-10-CM | POA: Diagnosis not present

## 2022-03-21 ENCOUNTER — Encounter: Payer: Self-pay | Admitting: Internal Medicine

## 2022-03-21 ENCOUNTER — Ambulatory Visit: Payer: BC Managed Care – PPO | Admitting: Gastroenterology

## 2022-04-09 DIAGNOSIS — S0502XA Injury of conjunctiva and corneal abrasion without foreign body, left eye, initial encounter: Secondary | ICD-10-CM | POA: Diagnosis not present

## 2022-04-09 DIAGNOSIS — H16002 Unspecified corneal ulcer, left eye: Secondary | ICD-10-CM | POA: Diagnosis not present

## 2022-04-10 DIAGNOSIS — H16002 Unspecified corneal ulcer, left eye: Secondary | ICD-10-CM | POA: Diagnosis not present

## 2022-04-10 DIAGNOSIS — S0502XD Injury of conjunctiva and corneal abrasion without foreign body, left eye, subsequent encounter: Secondary | ICD-10-CM | POA: Diagnosis not present

## 2022-04-11 DIAGNOSIS — H16002 Unspecified corneal ulcer, left eye: Secondary | ICD-10-CM | POA: Diagnosis not present

## 2022-04-13 DIAGNOSIS — H1032 Unspecified acute conjunctivitis, left eye: Secondary | ICD-10-CM | POA: Diagnosis not present

## 2022-04-13 DIAGNOSIS — S0502XA Injury of conjunctiva and corneal abrasion without foreign body, left eye, initial encounter: Secondary | ICD-10-CM | POA: Diagnosis not present

## 2022-04-13 DIAGNOSIS — H16002 Unspecified corneal ulcer, left eye: Secondary | ICD-10-CM | POA: Diagnosis not present

## 2022-04-18 DIAGNOSIS — S0502XD Injury of conjunctiva and corneal abrasion without foreign body, left eye, subsequent encounter: Secondary | ICD-10-CM | POA: Diagnosis not present

## 2022-04-19 DIAGNOSIS — D122 Benign neoplasm of ascending colon: Secondary | ICD-10-CM | POA: Diagnosis not present

## 2022-04-19 DIAGNOSIS — K648 Other hemorrhoids: Secondary | ICD-10-CM | POA: Diagnosis not present

## 2022-04-19 DIAGNOSIS — K2970 Gastritis, unspecified, without bleeding: Secondary | ICD-10-CM | POA: Diagnosis not present

## 2022-04-19 DIAGNOSIS — K319 Disease of stomach and duodenum, unspecified: Secondary | ICD-10-CM | POA: Diagnosis not present

## 2022-04-19 DIAGNOSIS — R197 Diarrhea, unspecified: Secondary | ICD-10-CM | POA: Diagnosis not present

## 2022-04-19 DIAGNOSIS — D509 Iron deficiency anemia, unspecified: Secondary | ICD-10-CM | POA: Diagnosis not present

## 2022-04-19 DIAGNOSIS — R112 Nausea with vomiting, unspecified: Secondary | ICD-10-CM | POA: Diagnosis not present

## 2022-04-19 DIAGNOSIS — R1084 Generalized abdominal pain: Secondary | ICD-10-CM | POA: Diagnosis not present

## 2022-04-19 DIAGNOSIS — R634 Abnormal weight loss: Secondary | ICD-10-CM | POA: Diagnosis not present

## 2022-04-23 ENCOUNTER — Other Ambulatory Visit: Payer: BC Managed Care – PPO

## 2022-04-29 ENCOUNTER — Ambulatory Visit
Admission: EM | Admit: 2022-04-29 | Discharge: 2022-04-29 | Disposition: A | Discharge status: Home / Self Care | Payer: BC Managed Care – PPO

## 2022-04-29 DIAGNOSIS — K580 Irritable bowel syndrome with diarrhea: Secondary | ICD-10-CM | POA: Diagnosis not present

## 2022-04-29 DIAGNOSIS — R1011 Right upper quadrant pain: Secondary | ICD-10-CM | POA: Diagnosis not present

## 2022-04-29 NOTE — Discharge Instructions (Addendum)
-  Please head to the emergency department for further evaluation and management, I am concerned about your severe abdominal pain following the colonoscopy.  It is possible you have a bowel perforation, bowel obstruction, or even an issue with your gallbladder.  Please head straight there, stop and call 911 if symptoms get worse on the way. ?

## 2022-04-29 NOTE — ED Provider Notes (Signed)
?[facility] ? ? ? ?CSN: [id number] ?Arrival date & time: 04/29/22  0836 ? ? ?  ? ?History   ?Chief Complaint ?Chief Complaint  ?Patient presents with  ? Constipation  ? ? ?HPI ?Morgan [name] is a 26 y.o. female presenting with abdominal pain, nausea with vomiting, constipation following colonoscopy that occurred 1 week ago.  History of IBS.  States that she had been having severe watery diarrhea, and so her doctor ordered a colonoscopy and an endoscopy.  A 6 mm polyp was removed, and she states that she is having some irritation from that.  However the symptoms are getting progressively worse, last bowel movement was 3 days ago, and she is not passing gas.  States she has not vomited today, but she does have severe reflux.  Also with severe abdominal pain, worse in the right upper quadrant.  She has not had any abdominal surgeries in the past. Tolerating fluids but not food. Has been taking ibuprofen on an empty stomach for the pain. ? ?HPI ? ?Past Medical History:  ?Diagnosis Date  ? Chlamydia 01/28/2020  ? Treated 01/28/20, POC___________  ? Contraceptive management 04/25/2016  ? Dizziness and giddiness   ? ? ?Patient Active Problem List  ? Diagnosis Date Noted  ? Routine Papanicolaou smear 12/27/2021  ? Vaginal pain 12/27/2021  ? Irregular intermenstrual bleeding 09/04/2021  ? Encounter for initial prescription of contraceptive pills 09/04/2021  ? Irritable bowel syndrome with diarrhea 06/06/2021  ? Diarrhea 05/30/2021  ? History of PID 05/30/2021  ? LLQ pain 05/30/2021  ? Yeast infection 05/08/2021  ? Swelling of vulva 07/07/2020  ? Vaginal itching 04/21/2020  ? History of chlamydia 04/21/2020  ? Vaginal odor 04/21/2020  ? Vaginal discharge 04/21/2020  ? Chlamydia 01/28/2020  ? Screening examination for STD (sexually transmitted disease) 11/17/2019  ? Dysuria 11/17/2019  ? Pelvic pain 11/17/2019  ? RUQ pain 11/17/2019  ? Right flank pain 11/17/2019  ? PID (acute pelvic inflammatory disease) 11/17/2019   ? Mass of breast, right 10/09/2019  ? Nipple discharge 10/09/2019  ? Breast pain 10/09/2019  ? Encounter for initial prescription of injectable contraceptive 02/04/2019  ? Encounter for gynecological examination with Papanicolaou smear of cervix 02/04/2019  ? Urinary tract infection without hematuria 04/09/2017  ? BV (bacterial vaginosis) 04/09/2017  ? Pyelonephritis, acute 11/07/2016  ? GERD (gastroesophageal reflux disease) 11/18/2013  ? Radial styloid fracture 02/24/2013  ? Scaphoid fracture of wrist 02/24/2013  ? CLOSED FRACTURE OF CUBOID BONE 11/07/2009  ? ? ?Past Surgical History:  ?Procedure Laterality Date  ? ROOT CANAL  03/11/2017  ? ? ?OB History   ? ? Gravida  ?0  ? Para  ?0  ? Term  ?0  ? Preterm  ?0  ? AB  ?0  ? Living  ?0  ?  ? ? SAB  ?0  ? IAB  ?0  ? Ectopic  ?0  ? Multiple  ?0  ? Live Births  ?   ?   ?  ?  ? ? ? ?Home Medications   ? ?Prior to Admission medications   ?Medication Sig Start Date End Date Taking? Authorizing Provider  ?dicyclomine (BENTYL) 20 MG tablet Take 1 tablet (20 mg total) by mouth 4 (four) times daily -  before meals and at bedtime. 09/04/21   Adline PotterGriffin, Jennifer A, NP  ?medroxyPROGESTERone (DEPO-PROVERA) 150 MG/ML injection Inject 1 mL (150 mg total) into the muscle every 3 (three) months. 12/27/21   Adline PotterGriffin, Jennifer A, NP  ? ? ?  Family History ?Family History  ?Problem Relation Age of Onset  ? Diabetes Paternal Aunt   ? Cancer Paternal Aunt   ? Alzheimer's disease Paternal Aunt   ? Diabetes Maternal Grandmother   ? Cancer Paternal Grandmother   ? Stroke Paternal Grandmother   ? Heart attack Paternal Grandmother   ? Diabetes Paternal Grandmother   ? Diabetes Other   ? ? ?Social History ?Social History  ? ?Tobacco Use  ? Smoking status: Former  ?  Years: 5.00  ?  Types: Cigarettes  ? Smokeless tobacco: Never  ?Vaping Use  ? Vaping Use: Former  ?Substance Use Topics  ? Alcohol use: No  ? Drug use: Yes  ?  Types: Marijuana  ?  Comment: not often  ? ? ? ?Allergies   ?Patient has no  known allergies. ? ? ?Review of Systems ?Review of Systems  ?Gastrointestinal:  Positive for abdominal pain.  ?All other systems reviewed and are negative. ? ? ?Physical Exam ?Triage Vital Signs ?ED Triage Vitals  ?Enc Vitals Group  ?   BP 04/29/22 0851 116/79  ?   Pulse Rate 04/29/22 0851 69  ?   Resp 04/29/22 0851 18  ?   Temp 04/29/22 0851 98.6 ?F (37 ?C)  ?   Temp Source 04/29/22 0851 Oral  ?   SpO2 04/29/22 0851 99 %  ?   Weight --   ?   Height --   ?   Head Circumference --   ?   Peak Flow --   ?   Pain Score 04/29/22 0850 5  ?   Pain Loc --   ?   Pain Edu? --   ?   Excl. in GC? --   ? ?No data found. ? ?Updated Vital Signs ?BP 116/79 (BP Location: Right Arm)   Pulse 69   Temp 98.6 ?F (37 ?C) (Oral)   Resp 18   SpO2 99%  ? ?Visual Acuity ?Right Eye Distance:   ?Left Eye Distance:   ?Bilateral Distance:   ? ?Right Eye Near:   ?Left Eye Near:    ?Bilateral Near:    ? ?Physical Exam ?Vitals reviewed.  ?Constitutional:   ?   General: She is not in acute distress. ?   Appearance: Normal appearance. She is ill-appearing.  ?HENT:  ?   Head: Normocephalic and atraumatic.  ?   Mouth/Throat:  ?   Mouth: Mucous membranes are moist.  ?   Comments: Moist mucous membranes ?Eyes:  ?   Extraocular Movements: Extraocular movements intact.  ?   Pupils: Pupils are equal, round, and reactive to light.  ?Cardiovascular:  ?   Rate and Rhythm: Normal rate and regular rhythm.  ?   Heart sounds: Normal heart sounds.  ?Pulmonary:  ?   Effort: Pulmonary effort is normal.  ?   Breath sounds: Normal breath sounds. No wheezing, rhonchi or rales.  ?Abdominal:  ?   General: There is no distension.  ?   Palpations: Abdomen is soft. There is no mass.  ?   Tenderness: There is generalized abdominal tenderness and tenderness in the right upper quadrant. There is no right CVA tenderness, left CVA tenderness, guarding or rebound.  ?   Comments: Generalized abd pain, worst in the RUQ. Uncomfortable throughout exam. Guarding but no rebound.   ?Skin: ?   General: Skin is warm.  ?   Capillary Refill: Capillary refill takes less than 2 seconds.  ?   Comments: Good skin turgor  ?  Neurological:  ?   General: No focal deficit present.  ?   Mental Status: She is alert and oriented to person, place, and time.  ?Psychiatric:     ?   Mood and Affect: Mood normal.     ?   Behavior: Behavior normal.  ? ? ? ?UC Treatments / Results  ?Labs ?(all labs ordered are listed, but only abnormal results are displayed) ?Labs Reviewed - No data to display ? ?EKG ? ? ?Radiology ?No results found. ? ?Procedures ?Procedures (including critical care time) ? ?Medications Ordered in UC ?Medications - No data to display ? ?Initial Impression / Assessment and Plan / UC Course  ?I have reviewed the triage vital signs and the nursing notes. ? ?Pertinent labs & imaging results that were available during my care of the patient were reviewed by me and considered in my medical decision making (see chart for details). ? ?  ? ?This patient is a very pleasant 26 y.o. year old female presenting with severe abd pain following colonoscopy. Afebrile, nontachy. I do have concern for perforation vs obstruction vs cholecystitis; she is in significant pain, with constipation and vomiting. I also have concern for gastritis as she has been taking motrin on an empty stomach for the pain. Sent to ED via POV, she declines transport via EMS.  ? ?Final Clinical Impressions(s) / UC Diagnoses  ? ?Final diagnoses:  ?RUQ abdominal pain  ?Irritable bowel syndrome with diarrhea  ? ? ? ?Discharge Instructions   ? ?  ?-Please head to the emergency department for further evaluation and management, I am concerned about your severe abdominal pain following the colonoscopy.  It is possible you have a bowel perforation, bowel obstruction, or even an issue with your gallbladder.  Please head straight there, stop and call 911 if symptoms get worse on the way. ? ? ?ED Prescriptions   ?None ?  ? ?PDMP not reviewed this  encounter. ?  ?Rhys Martini, PA-C ?04/29/22 0914 ? ?

## 2022-04-29 NOTE — ED Triage Notes (Signed)
Pt states she has had some constipation since Friday night, with headache and she has been vomiting ? ?Pt states she had a colonoscopy about a week ago and she does not know if that has something to do with it ? ?Pt states she has taken Tylenol and Motrin without any relief ? ? ? ?Denies Fever ?

## 2022-05-16 ENCOUNTER — Other Ambulatory Visit (INDEPENDENT_AMBULATORY_CARE_PROVIDER_SITE_OTHER): Payer: BC Managed Care – PPO

## 2022-05-16 ENCOUNTER — Other Ambulatory Visit (HOSPITAL_COMMUNITY)
Admission: RE | Admit: 2022-05-16 | Discharge: 2022-05-16 | Disposition: A | Discharge status: Home / Self Care | Payer: BC Managed Care – PPO | Source: Ambulatory Visit | Attending: Obstetrics & Gynecology | Admitting: Obstetrics & Gynecology

## 2022-05-16 DIAGNOSIS — R1030 Lower abdominal pain, unspecified: Secondary | ICD-10-CM | POA: Insufficient documentation

## 2022-05-16 DIAGNOSIS — R3915 Urgency of urination: Secondary | ICD-10-CM

## 2022-05-16 DIAGNOSIS — R350 Frequency of micturition: Secondary | ICD-10-CM | POA: Diagnosis not present

## 2022-05-16 DIAGNOSIS — Z3202 Encounter for pregnancy test, result negative: Secondary | ICD-10-CM | POA: Diagnosis not present

## 2022-05-16 LAB — POCT URINALYSIS DIPSTICK
Blood, UA: NEGATIVE
Glucose, UA: NEGATIVE
Ketones, UA: NEGATIVE
Leukocytes, UA: NEGATIVE
Nitrite, UA: NEGATIVE
Protein, UA: NEGATIVE

## 2022-05-16 LAB — POCT URINE PREGNANCY: Preg Test, Ur: NEGATIVE

## 2022-05-16 NOTE — Progress Notes (Signed)
   NURSE VISIT- UTI SYMPTOMS   SUBJECTIVE:  Morgan [name] is a 26 y.o. G0P0000 female here for UTI symptoms. She is a GYN patient. She reports lower abdominal pain, urinary frequency, and urinary urgency for 3 days. She has not received depo since January and has been sexually active.  OBJECTIVE:  There were no vitals taken for this visit.  Appears well, in no apparent distress  Results for orders placed or performed in visit on 05/16/22 (from the past 24 hour(s))  POCT Urinalysis Dipstick   Collection Time: 05/16/22  3:56 PM  Result Value Ref Range   Color, UA     Clarity, UA     Glucose, UA Negative Negative   Bilirubin, UA     Ketones, UA neg    Spec Grav, UA     Blood, UA neg    pH, UA     Protein, UA Negative Negative   Urobilinogen, UA     Nitrite, UA neg    Leukocytes, UA Negative Negative   Appearance     Odor    POCT urine pregnancy   Collection Time: 05/16/22  3:57 PM  Result Value Ref Range   Preg Test, Ur Negative Negative    ASSESSMENT: GYN patient with UTI symptoms and negative nitrites Missed depo-possible pregnancy-UPT negative  PLAN: Note routed to Dr. Charlotta Newton   Rx sent by provider today: No Urine culture sent, patient requested. CV swab collected to r/o STD's Call or return to clinic prn if these symptoms worsen or fail to improve as anticipated. Follow-up: as needed   Jobe Marker  05/16/2022 4:00 PM

## 2022-05-18 LAB — CERVICOVAGINAL ANCILLARY ONLY
Candida Glabrata: NEGATIVE
Candida Vaginitis: NEGATIVE
Chlamydia: NEGATIVE
Comment: NEGATIVE
Comment: NEGATIVE
Comment: NEGATIVE
Comment: NEGATIVE
Comment: NORMAL
Neisseria Gonorrhea: NEGATIVE
Trichomonas: NEGATIVE

## 2022-05-22 ENCOUNTER — Other Ambulatory Visit: Payer: Self-pay | Admitting: Adult Health

## 2022-05-22 ENCOUNTER — Telehealth: Payer: Self-pay

## 2022-05-22 MED ORDER — SULFAMETHOXAZOLE-TRIMETHOPRIM 800-160 MG PO TABS: 1.0000 | ORAL_TABLET | Freq: Two times a day (BID) | ORAL | 0 refills | Status: DC

## 2022-05-22 NOTE — Progress Notes (Signed)
Will rx septra ds  

## 2022-05-22 NOTE — Telephone Encounter (Signed)
PT CALLED AND WANTS TO SPEAK WITH A NURSE, WAS IN LAST WEEK FOR A POSSIBLE UTI AND IS NOT FEELING ANY BETTER.

## 2022-05-23 LAB — URINE CULTURE

## 2022-05-24 ENCOUNTER — Telehealth: Payer: Self-pay | Admitting: *Deleted

## 2022-05-24 NOTE — Telephone Encounter (Signed)
I tried to call pt to confirm she picked up antibiotic for UTI. Mailbox is full @ 3:50 pm. Pt did read Dr. Lawana Chambers MyChart message result. Closing encounter. JSY

## 2022-05-24 NOTE — Telephone Encounter (Signed)
-----   Message from Janyth Pupa, DO sent at 05/24/2022  3:07 PM EDT ----- Please confirm that pt picked up antibiotic.  Up to her if she would like to return in 2-3 wks to confirm infection has resolved

## 2022-05-30 ENCOUNTER — Ambulatory Visit: Payer: BC Managed Care – PPO

## 2022-07-22 ENCOUNTER — Ambulatory Visit
Admission: EM | Admit: 2022-07-22 | Discharge: 2022-07-22 | Disposition: A | Discharge status: Home / Self Care | Payer: BC Managed Care – PPO | Attending: Urgent Care | Admitting: Urgent Care

## 2022-07-22 DIAGNOSIS — M545 Low back pain, unspecified: Secondary | ICD-10-CM | POA: Diagnosis not present

## 2022-07-22 DIAGNOSIS — S300XXA Contusion of lower back and pelvis, initial encounter: Secondary | ICD-10-CM | POA: Diagnosis not present

## 2022-07-22 DIAGNOSIS — R102 Pelvic and perineal pain: Secondary | ICD-10-CM

## 2022-07-22 LAB — POCT URINALYSIS DIP (MANUAL ENTRY)
Blood, UA: NEGATIVE
Glucose, UA: NEGATIVE mg/dL
Leukocytes, UA: NEGATIVE
Nitrite, UA: NEGATIVE
Protein Ur, POC: 30 mg/dL — AB
Spec Grav, UA: 1.025 (ref 1.010–1.025)
Urobilinogen, UA: 1 E.U./dL
pH, UA: 6 (ref 5.0–8.0)

## 2022-07-22 LAB — POCT URINE PREGNANCY: Preg Test, Ur: NEGATIVE

## 2022-07-22 MED ORDER — NAPROXEN 500 MG PO TABS: 500.0000 mg | ORAL_TABLET | Freq: Two times a day (BID) | ORAL | 0 refills | Status: DC

## 2022-07-22 MED ORDER — KETOROLAC TROMETHAMINE 30 MG/ML IJ SOLN
30.0000 mg | Freq: Once | INTRAMUSCULAR | Status: AC
  Administered 2022-07-22: 30 mg via INTRAMUSCULAR

## 2022-07-22 MED ORDER — TIZANIDINE HCL 4 MG PO TABS: 4.0000 mg | ORAL_TABLET | Freq: Every day | ORAL | 0 refills | Status: DC

## 2022-07-22 NOTE — ED Triage Notes (Signed)
Pt states that she injured her lower back. Pt states that she has some left side abdominal pain that pertains to back injury as well. X2 days

## 2022-07-22 NOTE — ED Provider Notes (Signed)
[facility]   MRN: [id number] DOB: 09-03-1996  Subjective:   Morgan [name] is a 26 y.o. female presenting for 2-day history of acute onset persistent low back pain, left-sided pelvic pain.  Patient states that the symptoms started from being hit accidentally while she was helping to set up for a party.  There was a table that was pushed into her low back and was very unexpected causing her to jerk forward.  She does have a history of intermittent abdominal pains, pelvic pains.  Feels like this pain is more related to her back.  No changes to bowel or urinary habits, radicular symptoms.  Needs a note for work as well.  No current facility-administered medications for this encounter.  Current Outpatient Medications:    dicyclomine (BENTYL) 20 MG tablet, Take 1 tablet (20 mg total) by mouth 4 (four) times daily -  before meals and at bedtime., Disp: 120 tablet, Rfl: 3   medroxyPROGESTERone (DEPO-PROVERA) 150 MG/ML injection, Inject 1 mL (150 mg total) into the muscle every 3 (three) months., Disp: 1 mL, Rfl: 4   sulfamethoxazole-trimethoprim (BACTRIM DS) 800-160 MG tablet, Take 1 tablet by mouth 2 (two) times daily. Take 1 bid, Disp: 14 tablet, Rfl: 0   No Known Allergies  Past Medical History:  Diagnosis Date   Chlamydia 01/28/2020   Treated 01/28/20, POC___________   Contraceptive management 04/25/2016   Dizziness and giddiness      Past Surgical History:  Procedure Laterality Date   ROOT CANAL  03/11/2017    Family History  Problem Relation Age of Onset   Diabetes Paternal Aunt    Cancer Paternal Aunt    Alzheimer's disease Paternal Aunt    Diabetes Maternal Grandmother    Cancer Paternal Grandmother    Stroke Paternal Grandmother    Heart attack Paternal Grandmother    Diabetes Paternal Grandmother    Diabetes Other     Social History   Tobacco Use   Smoking status: Every Day    Years: 5.00    Types: Cigarettes, Cigars   Smokeless tobacco: Never   Vaping Use   Vaping Use: Former  Substance Use Topics   Alcohol use: No   Drug use: Yes    Types: Marijuana    Comment: not often    ROS   Objective:   Vitals: BP (!) 101/59 (BP Location: Right Arm)   Pulse 99   Temp 98.7 F (37.1 C) (Oral)   Resp 20   Ht 5\' 2"  (1.575 m)   LMP 06/14/2022   SpO2 95%   BMI 25.24 kg/m   Physical Exam Constitutional:      General: She is not in acute distress.    Appearance: Normal appearance. She is well-developed. She is not ill-appearing, toxic-appearing or diaphoretic.  HENT:     Head: Normocephalic and atraumatic.     Nose: Nose normal.     Mouth/Throat:     Mouth: Mucous membranes are moist.     Pharynx: Oropharynx is clear.  Eyes:     General: No scleral icterus.       Right eye: No discharge.        Left eye: No discharge.     Extraocular Movements: Extraocular movements intact.     Conjunctiva/sclera: Conjunctivae normal.  Cardiovascular:     Rate and Rhythm: Normal rate.  Pulmonary:     Effort: Pulmonary effort is normal.  Abdominal:     General: Bowel sounds are normal. There is no  distension.     Palpations: Abdomen is soft. There is no mass.     Tenderness: There is abdominal tenderness (left sided) in the suprapubic area and left lower quadrant. There is no right CVA tenderness, left CVA tenderness, guarding or rebound.  Musculoskeletal:     Lumbar back: Tenderness (across areas outlined) present. No swelling, edema, deformity, signs of trauma, lacerations, spasms or bony tenderness. Normal range of motion. Negative right straight leg raise test and negative left straight leg raise test. No scoliosis.       Back:  Skin:    General: Skin is warm and dry.  Neurological:     General: No focal deficit present.     Mental Status: She is alert and oriented to person, place, and time.  Psychiatric:        Mood and Affect: Mood normal.        Behavior: Behavior normal.        Thought Content: Thought content normal.         Judgment: Judgment normal.     Results for orders placed or performed during the hospital encounter of 07/22/22 (from the past 24 hour(s))  POCT urine pregnancy     Status: None   Collection Time: 07/22/22  2:44 PM  Result Value Ref Range   Preg Test, Ur Negative Negative  POCT urinalysis dipstick     Status: Abnormal   Collection Time: 07/22/22  2:45 PM  Result Value Ref Range   Color, UA yellow yellow   Clarity, UA clear clear   Glucose, UA negative negative mg/dL   Bilirubin, UA small (A) negative   Ketones, POC UA trace (5) (A) negative mg/dL   Spec Grav, UA 1.025 8.527 - 1.025   Blood, UA negative negative   pH, UA 6.0 5.0 - 8.0   Protein Ur, POC =30 (A) negative mg/dL   Urobilinogen, UA 1.0 0.2 or 1.0 E.U./dL   Nitrite, UA Negative Negative   Leukocytes, UA Negative Negative    Assessment and Plan :   PDMP not reviewed this encounter.  1. Acute left-sided low back pain without sciatica   2. Pelvic pain   3. Contusion of lower back, initial encounter    Deferred imaging given low suspicion for fracture.  Will manage conservatively for back contusion, pelvic pain with NSAID and muscle relaxant, rest and modification of physical activity.  Anticipatory guidance provided.  Counseled patient on potential for adverse effects with medications prescribed/recommended today, ER and return-to-clinic precautions discussed, patient verbalized understanding.    Wallis Bamberg, PA-C 07/22/22 1500

## 2022-08-08 ENCOUNTER — Other Ambulatory Visit (HOSPITAL_COMMUNITY)
Admission: RE | Admit: 2022-08-08 | Discharge: 2022-08-08 | Disposition: A | Discharge status: Home / Self Care | Payer: BC Managed Care – PPO | Source: Ambulatory Visit | Attending: Obstetrics & Gynecology | Admitting: Obstetrics & Gynecology

## 2022-08-08 ENCOUNTER — Other Ambulatory Visit (INDEPENDENT_AMBULATORY_CARE_PROVIDER_SITE_OTHER): Payer: BC Managed Care – PPO

## 2022-08-08 DIAGNOSIS — N760 Acute vaginitis: Secondary | ICD-10-CM | POA: Diagnosis not present

## 2022-08-08 DIAGNOSIS — N898 Other specified noninflammatory disorders of vagina: Secondary | ICD-10-CM | POA: Diagnosis not present

## 2022-08-08 NOTE — Progress Notes (Signed)
   NURSE VISIT- VAGINITIS  SUBJECTIVE:  Morgan [name] is a 26 y.o. G0P0000 GYN patientfemale here for a vaginal swab for vaginitis screening.  She reports the following symptoms: odor and mild abdominal cramping since last week . Denies abnormal vaginal bleeding, significant pelvic pain, fever, or UTI symptoms.  OBJECTIVE:  LMP 06/14/2022   Appears well, in no apparent distress  ASSESSMENT: Vaginal swab for vaginitis screening  PLAN: Self-collected vaginal probe for Gonorrhea, Chlamydia, Trichomonas, Bacterial Vaginosis, Yeast sent to lab Treatment: to be determined once results are received Follow-up as needed if symptoms persist/worsen, or new symptoms develop  Jobe Marker  08/08/2022 4:17 PM

## 2022-08-10 LAB — CERVICOVAGINAL ANCILLARY ONLY
Bacterial Vaginitis (gardnerella): NEGATIVE
Candida Glabrata: NEGATIVE
Candida Vaginitis: NEGATIVE
Chlamydia: NEGATIVE
Comment: NEGATIVE
Comment: NEGATIVE
Comment: NEGATIVE
Comment: NEGATIVE
Comment: NEGATIVE
Comment: NORMAL
Neisseria Gonorrhea: NEGATIVE
Trichomonas: NEGATIVE

## 2022-09-07 ENCOUNTER — Other Ambulatory Visit (HOSPITAL_COMMUNITY)
Admission: RE | Admit: 2022-09-07 | Discharge: 2022-09-07 | Disposition: A | Discharge status: Home / Self Care | Payer: BC Managed Care – PPO | Source: Ambulatory Visit | Attending: Obstetrics & Gynecology | Admitting: Obstetrics & Gynecology

## 2022-09-07 ENCOUNTER — Other Ambulatory Visit (INDEPENDENT_AMBULATORY_CARE_PROVIDER_SITE_OTHER): Payer: BC Managed Care – PPO | Admitting: *Deleted

## 2022-09-07 DIAGNOSIS — N760 Acute vaginitis: Secondary | ICD-10-CM | POA: Insufficient documentation

## 2022-09-07 DIAGNOSIS — Z113 Encounter for screening for infections with a predominantly sexual mode of transmission: Secondary | ICD-10-CM

## 2022-09-07 NOTE — Progress Notes (Signed)
   NURSE VISIT- VAGINITIS/STD  SUBJECTIVE:  Morgan [name] is a 26 y.o. G0P0000 GYN patientfemale here for a vaginal swab for vaginitis screening, STD screen.  She reports the following symptoms: none. Pt's partner has herpes. Pt was advised this swab don't check for herpes. Pt advised labs wasn't accurate and exposure needed to be at least 90 days. Pt was exposed 2 months ago. Pt was advised to let us know if she notices any blisters.  Denies abnormal vaginal bleeding, significant pelvic pain, fever, or UTI symptoms.  OBJECTIVE:  There were no vitals taken for this visit.  Appears well, in no apparent distress  ASSESSMENT: Vaginal swab for vaginitis screening & STD screening.   PLAN: Self-collected vaginal probe for Gonorrhea, Chlamydia, Trichomonas, Bacterial Vaginosis, Yeast sent to lab Treatment: to be determined once results are received Follow-up as needed if symptoms persist/worsen, or new symptoms develop  Malachy Mood  09/07/2022 11:14 AM

## 2022-09-10 ENCOUNTER — Other Ambulatory Visit: Payer: Self-pay | Admitting: Adult Health

## 2022-09-10 LAB — CERVICOVAGINAL ANCILLARY ONLY
Bacterial Vaginitis (gardnerella): POSITIVE — AB
Candida Glabrata: NEGATIVE
Candida Vaginitis: NEGATIVE
Chlamydia: NEGATIVE
Comment: NEGATIVE
Comment: NEGATIVE
Comment: NEGATIVE
Comment: NEGATIVE
Comment: NEGATIVE
Comment: NORMAL
Neisseria Gonorrhea: NEGATIVE
Trichomonas: NEGATIVE

## 2022-09-10 MED ORDER — METRONIDAZOLE 500 MG PO TABS: 500.0000 mg | ORAL_TABLET | Freq: Two times a day (BID) | ORAL | 0 refills | Status: DC

## 2022-09-10 NOTE — Progress Notes (Signed)
Has +BV on vaginal swab, will rx flagyl, no sex or alcohol while taking meds

## 2022-09-19 DIAGNOSIS — F4320 Adjustment disorder, unspecified: Secondary | ICD-10-CM | POA: Diagnosis not present

## 2022-10-01 DIAGNOSIS — F4320 Adjustment disorder, unspecified: Secondary | ICD-10-CM | POA: Diagnosis not present

## 2022-10-19 DIAGNOSIS — N760 Acute vaginitis: Secondary | ICD-10-CM | POA: Diagnosis not present

## 2022-10-19 DIAGNOSIS — R634 Abnormal weight loss: Secondary | ICD-10-CM | POA: Diagnosis not present

## 2022-10-19 DIAGNOSIS — S39012A Strain of muscle, fascia and tendon of lower back, initial encounter: Secondary | ICD-10-CM | POA: Diagnosis not present

## 2022-10-30 DIAGNOSIS — F4320 Adjustment disorder, unspecified: Secondary | ICD-10-CM | POA: Diagnosis not present

## 2022-11-19 ENCOUNTER — Other Ambulatory Visit: Payer: BC Managed Care – PPO

## 2022-12-05 DIAGNOSIS — N390 Urinary tract infection, site not specified: Secondary | ICD-10-CM | POA: Diagnosis not present

## 2022-12-05 DIAGNOSIS — R1030 Lower abdominal pain, unspecified: Secondary | ICD-10-CM | POA: Diagnosis not present

## 2022-12-05 DIAGNOSIS — R350 Frequency of micturition: Secondary | ICD-10-CM | POA: Diagnosis not present

## 2022-12-05 DIAGNOSIS — K529 Noninfective gastroenteritis and colitis, unspecified: Secondary | ICD-10-CM | POA: Diagnosis not present

## 2022-12-06 DIAGNOSIS — F4320 Adjustment disorder, unspecified: Secondary | ICD-10-CM | POA: Diagnosis not present

## 2022-12-11 DIAGNOSIS — F4320 Adjustment disorder, unspecified: Secondary | ICD-10-CM | POA: Diagnosis not present

## 2022-12-26 ENCOUNTER — Other Ambulatory Visit: Payer: BC Managed Care – PPO

## 2023-01-29 ENCOUNTER — Other Ambulatory Visit (HOSPITAL_COMMUNITY)
Admission: RE | Admit: 2023-01-29 | Discharge: 2023-01-29 | Disposition: A | Discharge status: Home / Self Care | Payer: BC Managed Care – PPO | Source: Ambulatory Visit | Attending: Adult Health | Admitting: Adult Health

## 2023-01-29 ENCOUNTER — Encounter: Payer: Self-pay | Admitting: Adult Health

## 2023-01-29 ENCOUNTER — Ambulatory Visit (INDEPENDENT_AMBULATORY_CARE_PROVIDER_SITE_OTHER): Payer: BC Managed Care – PPO | Admitting: Adult Health

## 2023-01-29 VITALS — BP 99/65 | HR 80 | Ht 62.0 in | Wt 131.0 lb

## 2023-01-29 DIAGNOSIS — Z01419 Encounter for gynecological examination (general) (routine) without abnormal findings: Secondary | ICD-10-CM

## 2023-01-29 DIAGNOSIS — N6311 Unspecified lump in the right breast, upper outer quadrant: Secondary | ICD-10-CM | POA: Diagnosis not present

## 2023-01-29 DIAGNOSIS — Z113 Encounter for screening for infections with a predominantly sexual mode of transmission: Secondary | ICD-10-CM

## 2023-01-29 DIAGNOSIS — F419 Anxiety disorder, unspecified: Secondary | ICD-10-CM

## 2023-01-29 MED ORDER — ESCITALOPRAM OXALATE 10 MG PO TABS: 10.0000 mg | ORAL_TABLET | Freq: Every day | ORAL | 2 refills | Status: DC

## 2023-01-29 NOTE — Progress Notes (Signed)
Patient ID: Morgan [name], female   DOB: 06-30-1996, 27 y.o.   MRN: [id number] History of Present Illness: Morgan [name] is a 27 year old biracial female, single, G0P0, in for well woman gyn exam and has knot in right breast and wants STD testing.  Partner has herpes and ? Hepatitis A. She is thinking about getting pregnant in near future.   Last pap was negative 12/27/21.  Current Medications, Allergies, Past Medical History, Past Surgical History, Family History and Social History were reviewed in [employer] record.     Review of Systems: Patient denies any headaches, hearing loss, fatigue, blurred vision, shortness of breath, chest pain, abdominal pain, problems with bowel movements, urination, or intercourse. No joint pain or mood swings.     Physical Exam:BP 99/65 (BP Location: Left Arm, Patient Position: Sitting, Cuff Size: Normal)   Pulse 80   Ht 5\' 2"  (1.575 m)   Wt 131 lb (59.4 kg)   LMP 01/09/2023 (Approximate)   BMI 23.96 kg/m   General:  Well developed, well nourished, no acute distress Skin:  Warm and dry Neck:  Midline trachea, normal thyroid, good ROM, no lymphadenopathy Lungs; Clear to auscultation bilaterally Breast:  No dominant palpable mass, retraction, or nipple discharge, on left and has nipple rod, on rod no retraction, rod is out and some white discharge at rod hole, has 2 cm tender, mass at 11 0' clock 3 FB from areola  Cardiovascular: Regular rate and rhythm Abdomen:  Soft, non tender, no hepatosplenomegaly Pelvic:  External genitalia is normal in appearance, no lesions.  The vagina is normal in appearance. Urethra has no lesions or masses. The cervix is smooth.  Uterus is felt to be normal size, shape, and contour.  No adnexal masses or tenderness noted.Bladder is non tender, no masses felt. CV swab obtained. Extremities/musculoskeletal:  No swelling or varicosities noted, no clubbing or cyanosis Psych:  No mood changes, alert and  cooperative,seems happy AA is 1 Fall risk is low    01/29/2023   11:56 AM 02/04/2019    3:35 PM 01/30/2017    3:05 PM  Depression screen PHQ 2/9  Decreased Interest 3 0 2  Down, Depressed, Hopeless 1 0 0  PHQ - 2 Score 4 0 2  Altered sleeping 1  2  Tired, decreased energy 3  2  Change in appetite 2  1  Feeling bad or failure about yourself  3    Trouble concentrating 2  1  Moving slowly or fidgety/restless 1    Suicidal thoughts 0  0  PHQ-9 Score 16  8  Difficult doing work/chores   Somewhat difficult       01/29/2023   11:57 AM  GAD 7 : Generalized Anxiety Score  Nervous, Anxious, on Edge 1  Control/stop worrying 1  Worry too much - different things 1  Trouble relaxing 2  Restless 1  Easily annoyed or irritable 1  Afraid - awful might happen 0  Total GAD 7 Score 7      Upstream - 01/29/23 1152       Pregnancy Intention Screening   Does the patient want to become pregnant in the next year? Yes    Does the patient's partner want to become pregnant in the next year? Yes    Would the patient like to discuss contraceptive options today? No      Contraception Wrap Up   Current Method Pregnant/Seeking Pregnancy    End Method Pregnant/Seeking Pregnancy  Examination chaperoned by Levy Pupa LPN  Impression and Plan: 1. Encounter for well woman exam with routine gynecological exam Physical in 1 year Pap in 2026 Take OTC PNV  2. Mass of upper outer quadrant of right breast Has 2 cm tender mass at 11 0' clock  Right breast US scheduled for 02/21/23 at 11:40 am at [facility] - US BREAST LTD UNI RIGHT INC AXILLA; Future  3. Screening examination for STD (sexually transmitted disease) CV swab sent for GC/CHL,trich,BV and yeast  Will check labs  - Hepatitis C antibody - HIV Antibody (routine testing w rflx) - RPR - Hepatitis B surface antigen - HSV 2 antibody, IgG - Cervicovaginal ancillary only( [place])  4. Anxiety and depression + tired, no SI or  HI Is open to medication Meds ordered this encounter  Medications   escitalopram (LEXAPRO) 10 MG tablet    Sig: Take 1 tablet (10 mg total) by mouth daily.    Dispense:  30 tablet    Refill:  2    Order Specific Question:   Supervising Provider    Answer:   Tania Ade H [[id number]]   Follow up in 8 weeks for ROS

## 2023-01-30 LAB — HIV ANTIBODY (ROUTINE TESTING W REFLEX): HIV Screen 4th Generation wRfx: NONREACTIVE

## 2023-01-30 LAB — CERVICOVAGINAL ANCILLARY ONLY
Bacterial Vaginitis (gardnerella): POSITIVE — AB
Candida Glabrata: NEGATIVE
Candida Vaginitis: NEGATIVE
Chlamydia: NEGATIVE
Comment: NEGATIVE
Comment: NEGATIVE
Comment: NEGATIVE
Comment: NEGATIVE
Comment: NEGATIVE
Comment: NORMAL
Neisseria Gonorrhea: NEGATIVE
Trichomonas: NEGATIVE

## 2023-01-30 LAB — HSV-2 AB, IGG: HSV 2 IgG, Type Spec: <0.91 index (ref 0.00–0.90)

## 2023-01-30 LAB — HEPATITIS B SURFACE ANTIGEN: Hepatitis B Surface Ag: NEGATIVE

## 2023-01-30 LAB — HEPATITIS C ANTIBODY: Hep C Virus Ab: NONREACTIVE

## 2023-01-30 LAB — RPR: RPR Ser Ql: NONREACTIVE

## 2023-02-01 ENCOUNTER — Other Ambulatory Visit: Payer: Self-pay | Admitting: Adult Health

## 2023-02-01 MED ORDER — METRONIDAZOLE 0.75 % VA GEL: 1.0000 | Freq: Every day | VAGINAL | 0 refills | Status: DC

## 2023-02-01 NOTE — Progress Notes (Signed)
Rx metrogel for +BV

## 2023-02-21 ENCOUNTER — Ambulatory Visit (HOSPITAL_COMMUNITY): Payer: BC Managed Care – PPO | Attending: Adult Health

## 2023-02-21 ENCOUNTER — Encounter: Payer: Self-pay | Admitting: Radiology

## 2023-02-21 ENCOUNTER — Encounter (HOSPITAL_COMMUNITY): Payer: Self-pay

## 2023-02-22 ENCOUNTER — Ambulatory Visit (INDEPENDENT_AMBULATORY_CARE_PROVIDER_SITE_OTHER): Payer: BC Managed Care – PPO | Admitting: *Deleted

## 2023-02-22 DIAGNOSIS — N926 Irregular menstruation, unspecified: Secondary | ICD-10-CM | POA: Diagnosis not present

## 2023-02-22 DIAGNOSIS — Z789 Other specified health status: Secondary | ICD-10-CM | POA: Diagnosis not present

## 2023-02-22 LAB — POCT URINE PREGNANCY: Preg Test, Ur: POSITIVE — AB

## 2023-02-22 NOTE — Progress Notes (Signed)
   NURSE VISIT- PREGNANCY CONFIRMATION   SUBJECTIVE:  Morgan [name] is a 27 y.o. G0P0000 female at Unknown by uncertain LMP of Patient's last menstrual period was 01/09/2023 (approximate). Here for pregnancy confirmation.  Home pregnancy test: positive x 2   She reports nausea.  She is taking prenatal vitamins.    OBJECTIVE:  LMP 01/09/2023 (Approximate)   Appears well, in no apparent distress  Results for orders placed or performed in visit on 02/22/23 (from the past 24 hour(s))  POCT urine pregnancy   Collection Time: 02/22/23 11:18 AM  Result Value Ref Range   Preg Test, Ur Positive (A) Negative    ASSESSMENT: Positive pregnancy test, Unknown by LMP    PLAN: Schedule for dating ultrasound in pending hcg  Prenatal vitamins: continue   Nausea medicines: not currently needed   OB packet given: Yes  Janece Canterbury  02/22/2023 11:22 AM

## 2023-02-23 LAB — BETA HCG QUANT (REF LAB): hCG Quant: 264 m[IU]/mL

## 2023-02-28 ENCOUNTER — Inpatient Hospital Stay: Admission: RE | Admit: 2023-02-28 | Payer: BC Managed Care – PPO | Source: Ambulatory Visit

## 2023-02-28 ENCOUNTER — Encounter: Payer: Self-pay | Admitting: Emergency Medicine

## 2023-02-28 ENCOUNTER — Other Ambulatory Visit: Payer: Self-pay

## 2023-02-28 ENCOUNTER — Ambulatory Visit
Admission: EM | Admit: 2023-02-28 | Discharge: 2023-02-28 | Disposition: A | Discharge status: Home / Self Care | Payer: BC Managed Care – PPO | Attending: Nurse Practitioner | Admitting: Nurse Practitioner

## 2023-02-28 DIAGNOSIS — R059 Cough, unspecified: Secondary | ICD-10-CM | POA: Diagnosis not present

## 2023-02-28 DIAGNOSIS — R0789 Other chest pain: Secondary | ICD-10-CM | POA: Diagnosis not present

## 2023-02-28 MED ORDER — LIDOCAINE VISCOUS HCL 2 % MT SOLN
15.0000 mL | Freq: Once | OROMUCOSAL | Status: AC
  Administered 2023-02-28: 15 mL via OROMUCOSAL

## 2023-02-28 MED ORDER — ALUM & MAG HYDROXIDE-SIMETH 200-200-20 MG/5ML PO SUSP
30.0000 mL | Freq: Once | ORAL | Status: AC
  Administered 2023-02-28: 30 mL via ORAL

## 2023-02-28 NOTE — ED Provider Notes (Signed)
[facility]    CSN: [id number] Arrival date & time: 02/28/23  1910      History   Chief Complaint Chief Complaint  Patient presents with   Cough    HPI Morgan [name] is a 27 y.o. female.   The history is provided by the patient.   The patient presents for complaints of cough and chest pain that started this morning upon wakening.  Patient reports she is [redacted] weeks pregnant.  She describes the pain as "burning and pressure".  She states that the pain radiates to her back with the cough.  She denies fever, chills, wheezing, shortness of breath, difficulty breathing, or GI symptoms.  Patient reports she does have a history of reflux disease.  She states has noticed increased gas and bloating since she found out she was pregnant.  She states she does not take any medication for her reflux at this time.  States that she used to take "the purple pill".  She is not taking any medication for her symptoms.  Patient is scheduled to begin her prenatal care in the next 2 weeks.  Past Medical History:  Diagnosis Date   Chlamydia 01/28/2020   Treated 01/28/20, POC___________   Contraceptive management 04/25/2016   Dizziness and giddiness     Patient Active Problem List   Diagnosis Date Noted   Encounter for well woman exam with routine gynecological exam 01/29/2023   Anxiety and depression 01/29/2023   Routine Papanicolaou smear 12/27/2021   Vaginal pain 12/27/2021   Irregular intermenstrual bleeding 09/04/2021   Encounter for initial prescription of contraceptive pills 09/04/2021   Irritable bowel syndrome with diarrhea 06/06/2021   Diarrhea 05/30/2021   History of PID 05/30/2021   LLQ pain 05/30/2021   Yeast infection 05/08/2021   Swelling of vulva 07/07/2020   Vaginal itching 04/21/2020   History of chlamydia 04/21/2020   Vaginal odor 04/21/2020   Vaginal discharge 04/21/2020   Chlamydia 01/28/2020   Screening examination for STD (sexually transmitted disease) 11/17/2019    Dysuria 11/17/2019   Pelvic pain 11/17/2019   RUQ pain 11/17/2019   Right flank pain 11/17/2019   PID (acute pelvic inflammatory disease) 11/17/2019   Mass of breast, right 10/09/2019   Nipple discharge 10/09/2019   Breast pain 10/09/2019   Encounter for initial prescription of injectable contraceptive 02/04/2019   Encounter for gynecological examination with Papanicolaou smear of cervix 02/04/2019   Urinary tract infection without hematuria 04/09/2017   BV (bacterial vaginosis) 04/09/2017   Pyelonephritis, acute 11/07/2016   GERD (gastroesophageal reflux disease) 11/18/2013   Radial styloid fracture 02/24/2013   Scaphoid fracture of wrist 02/24/2013   CLOSED FRACTURE OF CUBOID BONE 11/07/2009    Past Surgical History:  Procedure Laterality Date   ROOT CANAL  03/11/2017    OB History     Gravida  1   Para  0   Term  0   Preterm  0   AB  0   Living  0      SAB  0   IAB  0   Ectopic  0   Multiple  0   Live Births               Home Medications    Prior to Admission medications   Medication Sig Start Date End Date Taking? Authorizing Provider  metroNIDAZOLE (METROGEL) 0.75 % vaginal gel Place 1 Applicatorful vaginally at bedtime. Use for 5 nights in vaginal 02/01/23   Estill Dooms,  NP  escitalopram (LEXAPRO) 10 MG tablet Take 1 tablet (10 mg total) by mouth daily. 01/29/23 01/29/24  Estill Dooms, NP    Family History Family History  Problem Relation Age of Onset   Diabetes Paternal Aunt    Cancer Paternal Aunt    Alzheimer's disease Paternal Aunt    Diabetes Maternal Grandmother    Cancer Paternal Grandmother    Stroke Paternal Grandmother    Heart attack Paternal Grandmother    Diabetes Paternal Grandmother    Diabetes Other     Social History Social History   Tobacco Use   Smoking status: Former    Years: 5.00    Types: Cigarettes, Cigars   Smokeless tobacco: Never  Vaping Use   Vaping Use: Former  Substance Use Topics    Alcohol use: Yes    Comment: occ   Drug use: Not Currently    Types: Marijuana    Comment: not often     Allergies   Patient has no known allergies.   Review of Systems Review of Systems Per HPI  Physical Exam Triage Vital Signs ED Triage Vitals  Enc Vitals Group     BP 02/28/23 1916 119/77     Pulse Rate 02/28/23 1916 75     Resp 02/28/23 1916 20     Temp 02/28/23 1916 98.2 F (36.8 C)     Temp Source 02/28/23 1916 Oral     SpO2 02/28/23 1916 98 %     Weight --      Height --      Head Circumference --      Peak Flow --      Pain Score 02/28/23 1917 7     Pain Loc --      Pain Edu? --      Excl. in [place]? --    No data found.  Updated Vital Signs BP 119/77 (BP Location: Right Arm)   Pulse 75   Temp 98.2 F (36.8 C) (Oral)   Resp 20   LMP 01/09/2023 (Approximate)   SpO2 98%   Visual Acuity Right Eye Distance:   Left Eye Distance:   Bilateral Distance:    Right Eye Near:   Left Eye Near:    Bilateral Near:     Physical Exam Vitals and nursing note reviewed.  Constitutional:      General: She is not in acute distress.    Appearance: Normal appearance. She is well-developed.  HENT:     Head: Normocephalic.     Right Ear: Tympanic membrane, ear canal and external ear normal.     Left Ear: Tympanic membrane, ear canal and external ear normal.     Nose: Nose normal.     Mouth/Throat:     Mouth: Mucous membranes are moist.     Pharynx: Posterior oropharyngeal erythema present.  Eyes:     Extraocular Movements: Extraocular movements intact.     Conjunctiva/sclera: Conjunctivae normal.     Pupils: Pupils are equal, round, and reactive to light.  Cardiovascular:     Rate and Rhythm: Normal rate and regular rhythm.     Pulses: Normal pulses.     Heart sounds: Normal heart sounds.  Pulmonary:     Effort: Pulmonary effort is normal.     Breath sounds: Normal breath sounds.  Abdominal:     General: Bowel sounds are normal. There is no distension.      Palpations: Abdomen is soft.     Tenderness: There is  no abdominal tenderness. There is no guarding or rebound.     Comments: GI cocktail provided to the patient.  Patient reports relief after the administration of the GI cocktail.  She states that she still continues to have pain with coughing.  Genitourinary:    Vagina: Normal. No vaginal discharge.  Musculoskeletal:     Cervical back: Normal range of motion.  Lymphadenopathy:     Cervical: No cervical adenopathy.  Skin:    General: Skin is warm and dry.     Findings: No erythema or rash.  Neurological:     General: No focal deficit present.     Mental Status: She is alert and oriented to person, place, and time.     Cranial Nerves: No cranial nerve deficit.  Psychiatric:        Mood and Affect: Mood normal.        Behavior: Behavior normal.      UC Treatments / Results  Labs (all labs ordered are listed, but only abnormal results are displayed) Labs Reviewed - No data to display  EKG: Normal sinus rhythm without ectopy, no STEMI.  Compared to EKG performed on 08/04/2018   Radiology No results found.  Procedures Procedures (including critical care time)  Medications Ordered in UC Medications  alum & mag hydroxide-simeth (MAALOX/MYLANTA) 200-200-20 MG/5ML suspension 30 mL (30 mLs Oral Given 02/28/23 1931)  lidocaine (XYLOCAINE) 2 % viscous mouth solution 15 mL (15 mLs Mouth/Throat Given 02/28/23 1931)    Initial Impression / Assessment and Plan / UC Course  I have reviewed the triage vital signs and the nursing notes.  Pertinent labs & imaging results that were available during my care of the patient were reviewed by me and considered in my medical decision making (see chart for details).  Patient is well-appearing, she is in no acute distress, vital signs are stable.  EKG normal sinus rhythm without ectopy, patient did find some relief with administration of the GI cocktail.  Symptoms appear to be most likely related  to reflux symptoms versus viral upper respiratory symptoms.  Do not suspect cardiac etiology at this time.  Patient was given information regarding over-the-counter medications that she can take such as Mylanta, Maalox, or times to help with her reflux symptoms during pregnancy.  She was also recommended to use famotidine.  For her cough, patient was advised that she can use Robitussin DM or Robitussin for her cough.  Patient was given strict follow-up precautions.  Discussed ER follow-up precautions with the patient.  Patient was advised to follow-up with the obstetrician to ensure medications are safe for her to take with her pregnancy.  Patient was in agreement with this plan of care and verbalizes understanding.  All questions were answered.  Patient stable for discharge.  Final Clinical Impressions(s) / UC Diagnoses   Final diagnoses:  Cough, unspecified type  Other chest pain     Discharge Instructions      Recommend continue medications to help with cough symptoms and possible reflux.  It is safe for you to take medications such as Mylanta, Tums or Maalox to help with your reflux symptoms.  You can also take over-the-counter Pepcid AC or famotidine. For the cough, recommend Robitussin or Robitussin DM for your symptoms.  As discussed, recommend that you sleep elevated on pillows, and use a humidifier in your bedroom at nighttime during sleep. If you develop new symptoms such as fever, chills, wheezing, shortness of breath, difficulty breathing, recommend that you follow-up for  reevaluation. Please follow-up with your obstetrician on tomorrow to determine the best medication for you to take to help with your symptoms. Follow-up as needed.     ED Prescriptions   None    PDMP not reviewed this encounter.   Tish Men, NP 02/28/23 2001

## 2023-02-28 NOTE — ED Triage Notes (Signed)
Pt reports cough with burning sensation in chest since this morning. Pt is [redacted] weeks pregnant.

## 2023-02-28 NOTE — Discharge Instructions (Addendum)
Recommend continue medications to help with cough symptoms and possible reflux.  It is safe for you to take medications such as Mylanta, Tums or Maalox to help with your reflux symptoms.  You can also take over-the-counter Pepcid AC or famotidine. For the cough, recommend Robitussin or Robitussin DM for your symptoms.  As discussed, recommend that you sleep elevated on pillows, and use a humidifier in your bedroom at nighttime during sleep. If you develop new symptoms such as fever, chills, wheezing, shortness of breath, difficulty breathing, recommend that you follow-up for reevaluation. Please follow-up with your obstetrician on tomorrow to determine the best medication for you to take to help with your symptoms. Follow-up as needed.

## 2023-03-01 ENCOUNTER — Telehealth: Payer: Self-pay

## 2023-03-01 ENCOUNTER — Ambulatory Visit: Payer: Self-pay

## 2023-03-01 DIAGNOSIS — J029 Acute pharyngitis, unspecified: Secondary | ICD-10-CM | POA: Diagnosis not present

## 2023-03-01 DIAGNOSIS — O99511 Diseases of the respiratory system complicating pregnancy, first trimester: Secondary | ICD-10-CM | POA: Diagnosis not present

## 2023-03-01 DIAGNOSIS — R079 Chest pain, unspecified: Secondary | ICD-10-CM | POA: Diagnosis not present

## 2023-03-01 DIAGNOSIS — Z3A01 Less than 8 weeks gestation of pregnancy: Secondary | ICD-10-CM | POA: Diagnosis not present

## 2023-03-01 DIAGNOSIS — J101 Influenza due to other identified influenza virus with other respiratory manifestations: Secondary | ICD-10-CM | POA: Diagnosis not present

## 2023-03-01 DIAGNOSIS — O10011 Pre-existing essential hypertension complicating pregnancy, first trimester: Secondary | ICD-10-CM | POA: Diagnosis not present

## 2023-03-01 DIAGNOSIS — M791 Myalgia, unspecified site: Secondary | ICD-10-CM | POA: Diagnosis not present

## 2023-03-01 DIAGNOSIS — R0602 Shortness of breath: Secondary | ICD-10-CM | POA: Diagnosis not present

## 2023-03-01 DIAGNOSIS — O26891 Other specified pregnancy related conditions, first trimester: Secondary | ICD-10-CM | POA: Diagnosis not present

## 2023-03-01 DIAGNOSIS — Z20822 Contact with and (suspected) exposure to covid-19: Secondary | ICD-10-CM | POA: Diagnosis not present

## 2023-03-01 DIAGNOSIS — O98511 Other viral diseases complicating pregnancy, first trimester: Secondary | ICD-10-CM | POA: Diagnosis not present

## 2023-03-01 DIAGNOSIS — R9431 Abnormal electrocardiogram [ECG] [EKG]: Secondary | ICD-10-CM | POA: Diagnosis not present

## 2023-03-01 MED ORDER — OSELTAMIVIR PHOSPHATE 75 MG PO CAPS: 75.0000 mg | ORAL_CAPSULE | Freq: Two times a day (BID) | ORAL | 0 refills | Status: DC

## 2023-03-01 NOTE — Telephone Encounter (Signed)
Pt called and said that she tested positive for the flu after her visit with [place] here yesterday. She says he symptoms has been coughing, and chest pain (which is what she was treated for yesterday). I spoke to the provider about her symptoms and what could she get beings though she is [redacted] weeks pregnant. Per provider, pt can take tamiflu. I sent tamiflu to the [place] which is her pharmacy.

## 2023-03-02 ENCOUNTER — Encounter (HOSPITAL_COMMUNITY): Payer: Self-pay | Admitting: Emergency Medicine

## 2023-03-02 ENCOUNTER — Emergency Department (HOSPITAL_COMMUNITY)
Admission: EM | Admit: 2023-03-02 | Discharge: 2023-03-02 | Disposition: A | Discharge status: Home / Self Care | Payer: BC Managed Care – PPO | Attending: Emergency Medicine | Admitting: Emergency Medicine

## 2023-03-02 ENCOUNTER — Other Ambulatory Visit: Payer: Self-pay

## 2023-03-02 ENCOUNTER — Emergency Department (HOSPITAL_COMMUNITY): Payer: BC Managed Care – PPO

## 2023-03-02 DIAGNOSIS — O209 Hemorrhage in early pregnancy, unspecified: Secondary | ICD-10-CM | POA: Insufficient documentation

## 2023-03-02 DIAGNOSIS — Z3A01 Less than 8 weeks gestation of pregnancy: Secondary | ICD-10-CM | POA: Insufficient documentation

## 2023-03-02 DIAGNOSIS — R1030 Lower abdominal pain, unspecified: Secondary | ICD-10-CM | POA: Diagnosis not present

## 2023-03-02 DIAGNOSIS — N939 Abnormal uterine and vaginal bleeding, unspecified: Secondary | ICD-10-CM

## 2023-03-02 DIAGNOSIS — R109 Unspecified abdominal pain: Secondary | ICD-10-CM

## 2023-03-02 DIAGNOSIS — O26891 Other specified pregnancy related conditions, first trimester: Secondary | ICD-10-CM | POA: Diagnosis not present

## 2023-03-02 LAB — URINALYSIS, ROUTINE W REFLEX MICROSCOPIC
Bacteria, UA: NONE SEEN
Bilirubin Urine: NEGATIVE
Glucose, UA: NEGATIVE mg/dL
Hgb urine dipstick: NEGATIVE
Ketones, ur: 5 mg/dL — AB
Leukocytes,Ua: NEGATIVE
Nitrite: NEGATIVE
Protein, ur: 30 mg/dL — AB
Specific Gravity, Urine: 1.025 (ref 1.005–1.030)
pH: 5 (ref 5.0–8.0)

## 2023-03-02 LAB — COMPREHENSIVE METABOLIC PANEL
ALT: 15 U/L (ref 0–44)
AST: 22 U/L (ref 15–41)
Albumin: 4.4 g/dL (ref 3.5–5.0)
Alkaline Phosphatase: 68 U/L (ref 38–126)
Anion gap: 6 (ref 5–15)
BUN: 8 mg/dL (ref 6–20)
CO2: 23 mmol/L (ref 22–32)
Calcium: 8.6 mg/dL — ABNORMAL LOW (ref 8.9–10.3)
Chloride: 102 mmol/L (ref 98–111)
Creatinine, Ser: 0.84 mg/dL (ref 0.44–1.00)
GFR, Estimated: >60 mL/min (ref >60)
Glucose, Bld: 78 mg/dL (ref 70–99)
Potassium: 4.1 mmol/L (ref 3.5–5.1)
Sodium: 131 mmol/L — ABNORMAL LOW (ref 135–145)
Total Bilirubin: 0.4 mg/dL (ref 0.3–1.2)
Total Protein: 7.9 g/dL (ref 6.5–8.1)

## 2023-03-02 LAB — CBC WITH DIFFERENTIAL/PLATELET
Abs Immature Granulocytes: 0.01 10*3/uL (ref 0.00–0.07)
Basophils Absolute: 0 10*3/uL (ref 0.0–0.1)
Basophils Relative: 1 %
Eosinophils Absolute: 0 10*3/uL (ref 0.0–0.5)
Eosinophils Relative: 0 %
HCT: 41.3 % (ref 36.0–46.0)
Hemoglobin: 14.5 g/dL (ref 12.0–15.0)
Immature Granulocytes: 0 %
Lymphocytes Relative: 22 %
Lymphs Abs: 0.8 10*3/uL (ref 0.7–4.0)
MCH: 33.3 pg (ref 26.0–34.0)
MCHC: 35.1 g/dL (ref 30.0–36.0)
MCV: 94.7 fL (ref 80.0–100.0)
Monocytes Absolute: 0.4 10*3/uL (ref 0.1–1.0)
Monocytes Relative: 10 %
Neutro Abs: 2.6 10*3/uL (ref 1.7–7.7)
Neutrophils Relative %: 67 %
Platelets: 179 10*3/uL (ref 150–400)
RBC: 4.36 MIL/uL (ref 3.87–5.11)
RDW: 10.9 % — ABNORMAL LOW (ref 11.5–15.5)
WBC: 3.9 10*3/uL — ABNORMAL LOW (ref 4.0–10.5)
nRBC: 0 % (ref 0.0–0.2)

## 2023-03-02 LAB — HCG, QUANTITATIVE, PREGNANCY: hCG, Beta Chain, Quant, S: 815 m[IU]/mL — ABNORMAL HIGH (ref <5)

## 2023-03-02 LAB — LIPASE, BLOOD: Lipase: 33 U/L (ref 11–51)

## 2023-03-02 NOTE — Discharge Instructions (Addendum)
Evaluation today revealed concern for possible threatened miscarriage.  Recommend that you follow-up with OB/GYN in 48 hours for repeat of your quantitative beta-hCG level and further evaluation.  If you have worsening abdominal pain, worsening vaginal bleeding, abnormal vaginal discharge or any other concerning symptom please return emergency department further evaluation.

## 2023-03-02 NOTE — ED Triage Notes (Signed)
Patient c/o fever that started yesterday with some wheezing. Denies any nausea, vomiting, or diarrhea. Per patient highest fever 101.5 in which patient is taking tylenol, last took at 4pm. Patient tested for Covid, flu, and RSV through [facility]. Patient tested positive for flu. Patient [redacted] weeks pregnant. Per patient some abd cramping with white discharge and small amount of pink blood after wiping x1. First pregnancy, patient has not seen OB/GYN yet.

## 2023-03-02 NOTE — ED Notes (Signed)
EDPA Provider at bedside. 

## 2023-03-02 NOTE — ED Notes (Signed)
Patient transported to Ultrasound 

## 2023-03-02 NOTE — ED Provider Notes (Signed)
[facility] Provider Note   CSN: [id number] Arrival date & time: 03/02/23  1810     History  Chief Complaint  Patient presents with   Fever   HPI Morgan [name] is a 27 y.o. female who is [redacted] weeks pregnant presenting for fever.  Patient diagnosed with the flu yesterday.  Has been taking Mucinex and Tylenol at home.  Fever has been persistent.  States she took Tylenol at 10 PM this morning at 5 PM this afternoon she still had a fever of 101.1.  Endorsing productive cough with congestion.  No shortness of breath or chest pain.  Did states she felt like she was wheezing at 1 time.  Also mention that she had some pink-tinged vaginal spotting with kind of white discharge earlier today.  Also endorsed intermittent abdominal cramping.  At this time has no abdominal pain.   Fever      Home Medications Prior to Admission medications   Medication Sig Start Date End Date Taking? Authorizing Provider  metroNIDAZOLE (METROGEL) 0.75 % vaginal gel Place 1 Applicatorful vaginally at bedtime. Use for 5 nights in vaginal 02/01/23   Derrek Monaco A, NP  escitalopram (LEXAPRO) 10 MG tablet Take 1 tablet (10 mg total) by mouth daily. 01/29/23 01/29/24  Estill Dooms, NP  oseltamivir (TAMIFLU) 75 MG capsule Take 1 capsule (75 mg total) by mouth every 12 (twelve) hours. 03/01/23   Volney American, PA-C      Allergies    Patient has no known allergies.    Review of Systems   Review of Systems  Constitutional:  Positive for fever.    Physical Exam   Vitals:   03/02/23 2248 03/02/23 2257  BP:  109/69  Pulse:  77  Resp:  16  Temp: 98.4 F (36.9 C)   SpO2:  100%    CONSTITUTIONAL:  well-appearing, NAD NEURO:  Alert and oriented x 3, CN 3-12 grossly intact EYES:  eyes equal and reactive ENT/NECK:  Supple, no stridor  CARDIO:  regular rhythm, appears well-perfused  PULM:  No respiratory distress, CTAB GI/GU:  non-distended, soft non  tender  Pelvic: No obvious lesions or masses in the vaginal vault, bright red bleeding noted at the cervical os, cervical os appears closed  MSK/SPINE:  No gross deformities, no edema, moves all extremities  SKIN:  no rash, atraumatic  *Additional and/or pertinent findings included in MDM below   ED Results / Procedures / Treatments   Labs (all labs ordered are listed, but only abnormal results are displayed) Labs Reviewed  CBC WITH DIFFERENTIAL/PLATELET - Abnormal; Notable for the following components:      Result Value   WBC 3.9 (*)    RDW 10.9 (*)    All other components within normal limits  COMPREHENSIVE METABOLIC PANEL - Abnormal; Notable for the following components:   Sodium 131 (*)    Calcium 8.6 (*)    All other components within normal limits  URINALYSIS, ROUTINE W REFLEX MICROSCOPIC - Abnormal; Notable for the following components:   APPearance HAZY (*)    Ketones, ur 5 (*)    Protein, ur 30 (*)    All other components within normal limits  HCG, QUANTITATIVE, PREGNANCY - Abnormal; Notable for the following components:   hCG, Beta Chain, Quant, S 815 (*)    All other components within normal limits  LIPASE, BLOOD    EKG None  Radiology US OB LESS THAN 14 WEEKS WITH  OB TRANSVAGINAL  Result Date: 03/02/2023 CLINICAL DATA:  Vaginal bleeding during pregnancy EXAM: OBSTETRIC <14 WK [place] AND TRANSVAGINAL OB US TECHNIQUE: Both transabdominal and transvaginal ultrasound examinations were performed for complete evaluation of the gestation as well as the maternal uterus, adnexal regions, and pelvic cul-de-sac. Transvaginal technique was performed to assess early pregnancy. COMPARISON:  None Available. FINDINGS: Intrauterine gestational sac: Questionable single (located low in the endometrium) Yolk sac:  Not Visualized. Embryo:  Not Visualized. Cardiac Activity: Not Visualized. MSD: 4.2 mm   5 w   1 d Subchorionic hemorrhage:  None visualized. Maternal uterus/adnexae: Bilateral  ovaries are visualized and appear within normal limits. There is no pelvic free fluid. IMPRESSION: 1. Probable early intrauterine gestational sac, but no yolk sac, fetal pole, or cardiac activity yet visualized. Recommend follow-up quantitative B-HCG levels and follow-up US in 14 days to assess viability. This recommendation follows SRU consensus guidelines: Diagnostic Criteria for Nonviable Pregnancy Early in the First Trimester. [employer] Med [id number]. Electronically Signed   By: Ronney Asters M.D.   On: 03/02/2023 22:30    Procedures Procedures    Medications Ordered in ED Medications - No data to display  ED Course/ Medical Decision Making/ A&P                             Medical Decision Making Amount and/or Complexity of Data Reviewed Labs: ordered. Radiology: ordered.   27 year old well-appearing female who states that she is [redacted] weeks along her first pregnancy presenting for fever, vaginal bleeding, and abdominal cramping.  Exam notable for bleeding at the cervical os.  I personally reviewed and interpreted labs which revealed beta-hCG quantitative 815.  In setting of recent vaginal bleeding and abdominal cramping was concern for possible ectopic pregnancy which prompted evaluation ultrasound.  Fortunately, ultrasound indicated likely probable early intrauterine pregnancy and was negative for ectopic.  Beta-hCG level was notably low with she is [redacted] weeks along in her pregnancy.  Overall suggestive of possible threatened miscarriage.  Advised patient of this concern.  Advised that she follow-up with OB/GYN in 48 hours for follow-up quantitative beta-hCG and reevaluation.  Discharged stable vitals.  Discussed pertinent return precautions.        Final Clinical Impression(s) / ED Diagnoses Final diagnoses:  Vaginal bleeding  Abdominal cramping    Rx / DC Orders ED Discharge Orders     None         Harriet Pho, PA-C 03/02/23 2320    Dorie Rank, MD 03/03/23  1729

## 2023-03-04 ENCOUNTER — Telehealth: Payer: Self-pay | Admitting: *Deleted

## 2023-03-04 ENCOUNTER — Other Ambulatory Visit: Payer: BC Managed Care – PPO

## 2023-03-04 DIAGNOSIS — O209 Hemorrhage in early pregnancy, unspecified: Secondary | ICD-10-CM

## 2023-03-04 NOTE — Telephone Encounter (Signed)
Patient called stating she was seen in the ED on 3/9 for vaginal bleeding.  Says she is still having bleeding and cramping this morning. Had HCG and u/s at [facility].  Informed patient she will need repeat HCG this afternoon and results will be back tomorrow. Advised to increase water intake, no sex for 7 days after bleeding stops.  Pt verbalized understanding.

## 2023-03-05 ENCOUNTER — Other Ambulatory Visit: Payer: Self-pay | Admitting: Adult Health

## 2023-03-05 DIAGNOSIS — O209 Hemorrhage in early pregnancy, unspecified: Secondary | ICD-10-CM

## 2023-03-05 LAB — BETA HCG QUANT (REF LAB): hCG Quant: 569 m[IU]/mL

## 2023-03-06 DIAGNOSIS — O209 Hemorrhage in early pregnancy, unspecified: Secondary | ICD-10-CM | POA: Diagnosis not present

## 2023-03-07 ENCOUNTER — Other Ambulatory Visit: Payer: Self-pay | Admitting: Adult Health

## 2023-03-07 DIAGNOSIS — O039 Complete or unspecified spontaneous abortion without complication: Secondary | ICD-10-CM

## 2023-03-07 LAB — BETA HCG QUANT (REF LAB): hCG Quant: 146 m[IU]/mL

## 2023-03-07 LAB — ABO/RH: Rh Factor: POSITIVE

## 2023-03-19 ENCOUNTER — Other Ambulatory Visit: Payer: BC Managed Care – PPO

## 2023-03-26 ENCOUNTER — Other Ambulatory Visit (HOSPITAL_COMMUNITY)
Admission: RE | Admit: 2023-03-26 | Discharge: 2023-03-26 | Disposition: A | Discharge status: Home / Self Care | Payer: BC Managed Care – PPO | Source: Ambulatory Visit | Attending: Adult Health | Admitting: Adult Health

## 2023-03-26 ENCOUNTER — Encounter: Payer: Self-pay | Admitting: Adult Health

## 2023-03-26 ENCOUNTER — Ambulatory Visit: Payer: BC Managed Care – PPO | Admitting: Adult Health

## 2023-03-26 VITALS — BP 112/65 | HR 84 | Ht 62.0 in | Wt 133.0 lb

## 2023-03-26 DIAGNOSIS — R1032 Left lower quadrant pain: Secondary | ICD-10-CM | POA: Diagnosis not present

## 2023-03-26 DIAGNOSIS — N898 Other specified noninflammatory disorders of vagina: Secondary | ICD-10-CM | POA: Diagnosis not present

## 2023-03-26 DIAGNOSIS — Z8759 Personal history of other complications of pregnancy, childbirth and the puerperium: Secondary | ICD-10-CM | POA: Diagnosis not present

## 2023-03-26 DIAGNOSIS — F32A Depression, unspecified: Secondary | ICD-10-CM

## 2023-03-26 DIAGNOSIS — F419 Anxiety disorder, unspecified: Secondary | ICD-10-CM

## 2023-03-26 DIAGNOSIS — O039 Complete or unspecified spontaneous abortion without complication: Secondary | ICD-10-CM | POA: Diagnosis not present

## 2023-03-26 MED ORDER — ESCITALOPRAM OXALATE 10 MG PO TABS: 10.0000 mg | ORAL_TABLET | Freq: Every day | ORAL | 6 refills | Status: DC

## 2023-03-26 NOTE — Progress Notes (Signed)
Subjective:     Patient ID: Morgan [name], female   DOB: September 05, 1996, 26 y.o.   MRN: [id number]  HPI Morgan [name] is a 27 year old white female,single, G1P0010, in for follow up on taking lexarpo, and is better. She wants to do a self swab for vaginal discharge and odor and has some LLQ pain that comes and goes. She had a miscarriage recently and needs follow up QHCG(order was in), and she has headaches on and off, tylenol helps.     Component Value Date/Time   DIAGPAP  12/27/2021 1145    - Negative for intraepithelial lesion or malignancy (NILM)   DIAGPAP  02/04/2019 0000    NEGATIVE FOR INTRAEPITHELIAL LESIONS OR MALIGNANCY.   ADEQPAP  12/27/2021 1145    Satisfactory for evaluation; transformation zone component PRESENT.   ADEQPAP  02/04/2019 0000    Satisfactory for evaluation  endocervical/transformation zone component PRESENT.     Review of Systems Anxiety and depression is better +vaginal discharge and odor and has some LLQ pain that comes and goes  she has headaches on and off, tylenol helps.   Reviewed past medical,surgical, social and family history. Reviewed medications and allergies.  Objective:   Physical Exam BP 112/65 (BP Location: Left Arm, Patient Position: Sitting, Cuff Size: Normal)   Pulse 84   Ht 5\' 2"  (1.575 m)   Wt 133 lb (60.3 kg)   LMP 01/09/2023 (Approximate)   Breastfeeding No   BMI 24.33 kg/m     Skin warm and dry. Lungs: clear to ausculation bilaterally. Cardiovascular: regular rate and rhythm.  Self swab obtained.  Fall risk is low    03/26/2023    2:07 PM 01/29/2023   11:56 AM 02/04/2019    3:35 PM  Depression screen PHQ 2/9  Decreased Interest 1 3 0  Down, Depressed, Hopeless 1 1 0  PHQ - 2 Score 2 4 0  Altered sleeping 3 1   Tired, decreased energy 2 3   Change in appetite 3 2   Feeling bad or failure about yourself  0 3   Trouble concentrating 0 2   Moving slowly or fidgety/restless 0 1   Suicidal thoughts 0 0   PHQ-9 Score 10 16         03/26/2023    2:08 PM 01/29/2023   11:57 AM  GAD 7 : Generalized Anxiety Score  Nervous, Anxious, on Edge 0 1  Control/stop worrying 0 1  Worry too much - different things 1 1  Trouble relaxing 2 2  Restless 2 1  Easily annoyed or irritable 0 1  Afraid - awful might happen 0 0  Total GAD 7 Score 5 7      Upstream - 03/26/23 1405       Pregnancy Intention Screening   Does the patient want to become pregnant in the next year? No    Does the patient's partner want to become pregnant in the next year? No    Would the patient like to discuss contraceptive options today? No      Contraception Wrap Up   Current Method Female Condom    End Method Female Condom             Assessment:     1. Vaginal discharge CV swab sent for GC/CHL,trich,BV and yeast  - Cervicovaginal ancillary only( [place])  2. Vaginal odor CV swab sent  - Cervicovaginal ancillary only( [place])  3. LLQ pain Has pain that comes and goes  Will watch for now, if increases make appt for exam.   4. History of miscarriage Check QHCG today   5. Anxiety and depression She says she is feeling better Will continue lexapro 10 mg 1 daily  Meds ordered this encounter  Medications   escitalopram (LEXAPRO) 10 MG tablet    Sig: Take 1 tablet (10 mg total) by mouth daily.    Dispense:  30 tablet    Refill:  6    Order Specific Question:   Supervising Provider    Answer:   Florian Buff [[id number]]       Plan:     Follow up in 3 months for ROS or sooner if needed

## 2023-03-27 LAB — BETA HCG QUANT (REF LAB): hCG Quant: <1 m[IU]/mL

## 2023-03-28 LAB — CERVICOVAGINAL ANCILLARY ONLY
Bacterial Vaginitis (gardnerella): POSITIVE — AB
Candida Glabrata: NEGATIVE
Candida Vaginitis: NEGATIVE
Chlamydia: NEGATIVE
Comment: NEGATIVE
Comment: NEGATIVE
Comment: NEGATIVE
Comment: NEGATIVE
Comment: NEGATIVE
Comment: NORMAL
Neisseria Gonorrhea: NEGATIVE
Trichomonas: NEGATIVE

## 2023-03-29 ENCOUNTER — Other Ambulatory Visit: Payer: Self-pay | Admitting: Adult Health

## 2023-03-29 MED ORDER — METRONIDAZOLE 500 MG PO TABS: 500.0000 mg | ORAL_TABLET | Freq: Two times a day (BID) | ORAL | 0 refills | Status: DC

## 2023-03-29 NOTE — Progress Notes (Signed)
+  BV on vaginal swab rx flagyl, no sex or alcohol when taking

## 2023-05-10 ENCOUNTER — Ambulatory Visit (INDEPENDENT_AMBULATORY_CARE_PROVIDER_SITE_OTHER): Payer: BC Managed Care – PPO | Admitting: Adult Health

## 2023-05-10 ENCOUNTER — Encounter: Payer: Self-pay | Admitting: Adult Health

## 2023-05-10 VITALS — BP 121/87 | HR 84 | Ht 62.0 in | Wt 138.0 lb

## 2023-05-10 DIAGNOSIS — R1032 Left lower quadrant pain: Secondary | ICD-10-CM

## 2023-05-10 DIAGNOSIS — Z8759 Personal history of other complications of pregnancy, childbirth and the puerperium: Secondary | ICD-10-CM | POA: Diagnosis not present

## 2023-05-10 DIAGNOSIS — N920 Excessive and frequent menstruation with regular cycle: Secondary | ICD-10-CM | POA: Diagnosis not present

## 2023-05-10 LAB — POCT URINE PREGNANCY: Preg Test, Ur: NEGATIVE

## 2023-05-10 MED ORDER — KETOROLAC TROMETHAMINE 10 MG PO TABS: 10.0000 mg | ORAL_TABLET | Freq: Four times a day (QID) | ORAL | 0 refills | Status: DC | PRN

## 2023-05-10 NOTE — Progress Notes (Signed)
  Subjective:     Patient ID: Morgan [name], female   DOB: 04-Feb-1996, 27 y.o.   MRN: [id number]  HPI [name] is a 27 year old white  female,single, G1P0010, in complaining of last 2 periods being heavy and had clots and cramps. Has pain in left side now. She had miscarriage in March.      Component Value Date/Time   DIAGPAP  12/27/2021 1145    - Negative for intraepithelial lesion or malignancy (NILM)   DIAGPAP  02/04/2019 0000    NEGATIVE FOR INTRAEPITHELIAL LESIONS OR MALIGNANCY.   ADEQPAP  12/27/2021 1145    Satisfactory for evaluation; transformation zone component PRESENT.   ADEQPAP  02/04/2019 0000    Satisfactory for evaluation  endocervical/transformation zone component PRESENT.    Review of Systems + last 2 periods being heavy and had clots + cramps.  + pain in left side now. No new sex partners  Denies any problems with urination or bowel movements Reviewed past medical,surgical, social and family history. Reviewed medications and allergies.     Objective:   Physical Exam BP 121/87 (BP Location: Right Arm, Patient Position: Sitting, Cuff Size: Normal)   Pulse 84   Ht 5\' 2"  (1.575 m)   Wt 138 lb (62.6 kg)   LMP 05/01/2023   Breastfeeding No   BMI 25.24 kg/m  UPT is negative  Skin warm and dry.Pelvic: external genitalia is normal in appearance no lesions, vagina: no discharge or odor,urethra has no lesions or masses noted, cervix:smooth, uterus: normal size, shape and contour, non tender, no masses felt, adnexa: no masses, +LLQ tenderness noted, no rebound. Bladder is non tender and no masses felt.   Upstream - 05/10/23 1217       Pregnancy Intention Screening   Does the patient want to become pregnant in the next year? No    Does the patient's partner want to become pregnant in the next year? No    Would the patient like to discuss contraceptive options today? No      Contraception Wrap Up   Current Method Female Condom    End Method Female Condom             Examination chaperoned by Malachy Mood LPN     Assessment:     1. Menorrhagia with regular cycle Last 2 periods heavy with clots, had miscarriage in March  UPT negative Bleeding has stopped  - POCT urine pregnancy She declines any birth control  2. LLQ pain +tenderness LLQ, no rebound - POCT urine pregnancy Will rx Toradol for pain She is taking lexapro, advised of any signs of GI bleeding to go to ER  3. History of miscarriage Had miscarriage in March  - POCT urine pregnancy     Plan:     Follow up prn

## 2023-05-30 ENCOUNTER — Other Ambulatory Visit (HOSPITAL_COMMUNITY)
Admission: RE | Admit: 2023-05-30 | Discharge: 2023-05-30 | Disposition: A | Discharge status: Home / Self Care | Payer: BC Managed Care – PPO | Source: Ambulatory Visit | Attending: Obstetrics & Gynecology | Admitting: Obstetrics & Gynecology

## 2023-05-30 ENCOUNTER — Ambulatory Visit (INDEPENDENT_AMBULATORY_CARE_PROVIDER_SITE_OTHER): Payer: BC Managed Care – PPO | Admitting: *Deleted

## 2023-05-30 DIAGNOSIS — F32A Depression, unspecified: Secondary | ICD-10-CM | POA: Diagnosis not present

## 2023-05-30 DIAGNOSIS — R109 Unspecified abdominal pain: Secondary | ICD-10-CM | POA: Diagnosis not present

## 2023-05-30 DIAGNOSIS — N898 Other specified noninflammatory disorders of vagina: Secondary | ICD-10-CM | POA: Insufficient documentation

## 2023-05-30 DIAGNOSIS — Z3A01 Less than 8 weeks gestation of pregnancy: Secondary | ICD-10-CM | POA: Diagnosis not present

## 2023-05-30 DIAGNOSIS — Z3A38 38 weeks gestation of pregnancy: Secondary | ICD-10-CM | POA: Diagnosis not present

## 2023-05-30 DIAGNOSIS — Z3483 Encounter for supervision of other normal pregnancy, third trimester: Secondary | ICD-10-CM | POA: Diagnosis not present

## 2023-05-30 DIAGNOSIS — F419 Anxiety disorder, unspecified: Secondary | ICD-10-CM | POA: Diagnosis not present

## 2023-05-30 DIAGNOSIS — Z3201 Encounter for pregnancy test, result positive: Secondary | ICD-10-CM | POA: Diagnosis not present

## 2023-05-30 DIAGNOSIS — O3680X0 Pregnancy with inconclusive fetal viability, not applicable or unspecified: Secondary | ICD-10-CM | POA: Diagnosis not present

## 2023-05-30 NOTE — Progress Notes (Signed)
   NURSE VISIT- VAGINITIS  SUBJECTIVE:  Morgan [name] is a 27 y.o. G1P0010 GYN patientfemale here for a vaginal swab for vaginitis screening.  She reports the following symptoms:watery discharge described as white, odor, and abdominal cramping  for 1 week. Denies abnormal vaginal bleeding, significant pelvic pain, fever, or UTI symptoms.  OBJECTIVE:  LMP 05/01/2023   Appears well, in no apparent distress  ASSESSMENT: Vaginal swab for vaginitis screening  PLAN: Self-collected vaginal probe for Gonorrhea, Chlamydia, Trichomonas, Bacterial Vaginosis, Yeast sent to lab Treatment: to be determined once results are received Follow-up as needed if symptoms persist/worsen, or new symptoms develop  Jobe Marker  05/30/2023 3:20 PM

## 2023-06-03 LAB — CERVICOVAGINAL ANCILLARY ONLY
Bacterial Vaginitis (gardnerella): POSITIVE — AB
Candida Glabrata: NEGATIVE
Candida Vaginitis: NEGATIVE
Chlamydia: NEGATIVE
Comment: NEGATIVE
Comment: NEGATIVE
Comment: NEGATIVE
Comment: NEGATIVE
Comment: NEGATIVE
Comment: NORMAL
Neisseria Gonorrhea: NEGATIVE
Trichomonas: NEGATIVE

## 2023-06-04 ENCOUNTER — Other Ambulatory Visit: Payer: Self-pay | Admitting: Adult Health

## 2023-06-04 MED ORDER — METRONIDAZOLE 500 MG PO TABS: 500.0000 mg | ORAL_TABLET | Freq: Two times a day (BID) | ORAL | 0 refills | Status: DC

## 2023-06-04 NOTE — Progress Notes (Signed)
+  BV on vaginal swab will rx flagyl,no sex or alcohol while taking meds.  ?

## 2023-06-25 ENCOUNTER — Ambulatory Visit (INDEPENDENT_AMBULATORY_CARE_PROVIDER_SITE_OTHER): Payer: Medicaid Other | Admitting: Adult Health

## 2023-06-25 ENCOUNTER — Encounter: Payer: Self-pay | Admitting: Adult Health

## 2023-06-25 ENCOUNTER — Other Ambulatory Visit (HOSPITAL_COMMUNITY)
Admission: RE | Admit: 2023-06-25 | Discharge: 2023-06-25 | Disposition: A | Discharge status: Home / Self Care | Payer: BC Managed Care – PPO | Source: Ambulatory Visit | Attending: Adult Health | Admitting: Adult Health

## 2023-06-25 VITALS — BP 113/75 | HR 73 | Ht 62.0 in | Wt 132.5 lb

## 2023-06-25 DIAGNOSIS — Z3201 Encounter for pregnancy test, result positive: Secondary | ICD-10-CM | POA: Diagnosis not present

## 2023-06-25 DIAGNOSIS — Z3A01 Less than 8 weeks gestation of pregnancy: Secondary | ICD-10-CM | POA: Insufficient documentation

## 2023-06-25 DIAGNOSIS — F32A Depression, unspecified: Secondary | ICD-10-CM

## 2023-06-25 DIAGNOSIS — F419 Anxiety disorder, unspecified: Secondary | ICD-10-CM | POA: Diagnosis not present

## 2023-06-25 DIAGNOSIS — N898 Other specified noninflammatory disorders of vagina: Secondary | ICD-10-CM

## 2023-06-25 DIAGNOSIS — Z3491 Encounter for supervision of normal pregnancy, unspecified, first trimester: Secondary | ICD-10-CM | POA: Diagnosis not present

## 2023-06-25 DIAGNOSIS — O3680X0 Pregnancy with inconclusive fetal viability, not applicable or unspecified: Secondary | ICD-10-CM | POA: Diagnosis not present

## 2023-06-25 LAB — POCT URINE PREGNANCY: Preg Test, Ur: POSITIVE — AB

## 2023-06-25 MED ORDER — PRENATAL VITAMIN 27-0.8 MG PO TABS: 1.0000 | ORAL_TABLET | Freq: Every day | ORAL | 12 refills | Status: DC

## 2023-06-25 NOTE — Progress Notes (Signed)
Subjective:     Patient ID: Morgan [name], female   DOB: 1995/12/29, 27 y.o.   MRN: [id number]  HPI [name] is a 27 year old white female,with SO, G2P0010, back in follow up on taking lexapro and doing good. She had +HPT today and wants confirmation.     Component Value Date/Time   DIAGPAP  12/27/2021 1145    - Negative for intraepithelial lesion or malignancy (NILM)   DIAGPAP  02/04/2019 0000    NEGATIVE FOR INTRAEPITHELIAL LESIONS OR MALIGNANCY.   ADEQPAP  12/27/2021 1145    Satisfactory for evaluation; transformation zone component PRESENT.   ADEQPAP  02/04/2019 0000    Satisfactory for evaluation  endocervical/transformation zone component PRESENT.    Review of Systems Anxiety and depression, is better +vaginal discharge Reviewed past medical,surgical, social and family history. Reviewed medications and allergies.     Objective:   Physical Exam BP 113/75 (BP Location: Left Arm, Patient Position: Sitting, Cuff Size: Normal)   Pulse 73   Ht 5\' 2"  (1.575 m)   Wt 132 lb 8 oz (60.1 kg)   LMP 05/30/2023   BMI 24.23 kg/m  UPT is +, about 3+5 weeks by LMP with EDD 03/05/24.Skin warm and dry.Lungs: clear to ausculation bilaterally. Cardiovascular: regular rate and rhythm.    Pelvic: external genitalia is normal in appearance no lesions, vagina: white discharge without odor,urethra has no lesions or masses noted, cervix:smooth, uterus: normal size, shape and contour, non tender, no masses felt, adnexa: no masses or tenderness noted. Bladder is non tender and no masses felt. CV swab obtained. Fall risk is low    06/25/2023    2:08 PM 03/26/2023    2:07 PM 01/29/2023   11:56 AM  Depression screen PHQ 2/9  Decreased Interest 2 1 3   Down, Depressed, Hopeless 0 1 1  PHQ - 2 Score 2 2 4   Altered sleeping 2 3 1   Tired, decreased energy 2 2 3   Change in appetite 2 3 2   Feeling bad or failure about yourself  0 0 3  Trouble concentrating 2 0 2  Moving slowly or fidgety/restless 0 0 1   Suicidal thoughts 0 0 0  PHQ-9 Score 10 10 16        06/25/2023    2:09 PM 03/26/2023    2:08 PM 01/29/2023   11:57 AM  GAD 7 : Generalized Anxiety Score  Nervous, Anxious, on Edge 2 0 1  Control/stop worrying 2 0 1  Worry too much - different things 2 1 1   Trouble relaxing 2 2 2   Restless 2 2 1   Easily annoyed or irritable 2 0 1  Afraid - awful might happen 0 0 0  Total GAD 7 Score 12 5 7       Upstream - 06/25/23 1420       Pregnancy Intention Screening   Does the patient want to become pregnant in the next year? N/A    Does the patient's partner want to become pregnant in the next year? N/A    Would the patient like to discuss contraceptive options today? No      Contraception Wrap Up   Current Method Pregnant/Seeking Pregnancy    End Method Pregnant/Seeking Pregnancy    Contraception Counseling Provided No            Examination chaperoned by Malachy Mood LPN Assessment:     1. Pregnancy examination or test, positive result - POCT urine pregnancy  2. Anxiety and depression Continue lexapro 10  mg 1 daily, has refills   3. Less than [redacted] weeks gestation of pregnancy Had miscarriage in March, will check baseline labs Will rx PNV - Beta hCG quant (ref lab) - Progesterone Meds ordered this encounter  Medications   Prenatal Vit-Fe Fumarate-FA (PRENATAL VITAMIN) 27-0.8 MG TABS    Sig: Take 1 tablet by mouth daily.    Dispense:  30 tablet    Refill:  12    Order Specific Question:   Supervising Provider    Answer:   Despina Hidden, LUTHER H [[id number]]    4. Encounter to determine fetal viability of pregnancy, single or unspecified fetus Return in about 4 weeks for dating [place] - US OB Comp Less 14 Wks; Future  5. Vaginal discharge CV swab sent for GC/CHL,trich,BV and yeast - Cervicovaginal ancillary only( [place])     Plan:     Review OB packet

## 2023-06-26 LAB — BETA HCG QUANT (REF LAB): hCG Quant: 95 m[IU]/mL

## 2023-06-26 LAB — PROGESTERONE: Progesterone: 18.9 ng/mL

## 2023-06-28 LAB — CERVICOVAGINAL ANCILLARY ONLY
Bacterial Vaginitis (gardnerella): POSITIVE — AB
Candida Glabrata: NEGATIVE
Candida Vaginitis: NEGATIVE
Chlamydia: NEGATIVE
Comment: NEGATIVE
Comment: NEGATIVE
Comment: NEGATIVE
Comment: NEGATIVE
Comment: NEGATIVE
Comment: NORMAL
Neisseria Gonorrhea: NEGATIVE
Trichomonas: NEGATIVE

## 2023-07-08 ENCOUNTER — Telehealth: Payer: Self-pay | Admitting: *Deleted

## 2023-07-08 NOTE — Telephone Encounter (Signed)
Work note sent to [employer]. JSY

## 2023-07-09 ENCOUNTER — Other Ambulatory Visit: Payer: Self-pay | Admitting: Adult Health

## 2023-07-09 MED ORDER — ONDANSETRON HCL 4 MG PO TABS: 4.0000 mg | ORAL_TABLET | Freq: Three times a day (TID) | ORAL | 1 refills | Status: DC | PRN

## 2023-07-09 NOTE — Progress Notes (Signed)
Rx sent for zofran

## 2023-07-20 ENCOUNTER — Ambulatory Visit: Payer: Self-pay

## 2023-07-24 ENCOUNTER — Ambulatory Visit: Payer: Medicaid Other

## 2023-07-24 ENCOUNTER — Other Ambulatory Visit: Payer: Self-pay | Admitting: Adult Health

## 2023-07-24 DIAGNOSIS — Z3A01 Less than 8 weeks gestation of pregnancy: Secondary | ICD-10-CM

## 2023-07-24 DIAGNOSIS — Z3481 Encounter for supervision of other normal pregnancy, first trimester: Secondary | ICD-10-CM | POA: Diagnosis not present

## 2023-07-24 DIAGNOSIS — O3680X0 Pregnancy with inconclusive fetal viability, not applicable or unspecified: Secondary | ICD-10-CM

## 2023-07-24 MED ORDER — METRONIDAZOLE 500 MG PO TABS: 500.0000 mg | ORAL_TABLET | Freq: Two times a day (BID) | ORAL | 0 refills | Status: DC

## 2023-07-24 NOTE — Progress Notes (Signed)
Had BV on vaginal swab will rx flagyl now, she had [place] today 7+6 weeks with +FHM.

## 2023-07-24 NOTE — Progress Notes (Signed)
[place] 7+6 wks,single IUP with yolk sac,normal ovaries,CRL 16.13 mm,FHR 169 bpm

## 2023-08-27 ENCOUNTER — Other Ambulatory Visit: Payer: Self-pay | Admitting: Obstetrics & Gynecology

## 2023-08-27 DIAGNOSIS — Z3682 Encounter for antenatal screening for nuchal translucency: Secondary | ICD-10-CM

## 2023-08-28 ENCOUNTER — Encounter: Payer: Self-pay | Admitting: Advanced Practice Midwife

## 2023-08-28 ENCOUNTER — Ambulatory Visit (INDEPENDENT_AMBULATORY_CARE_PROVIDER_SITE_OTHER): Payer: Medicaid Other

## 2023-08-28 ENCOUNTER — Encounter: Payer: Medicaid Other | Admitting: *Deleted

## 2023-08-28 ENCOUNTER — Ambulatory Visit (INDEPENDENT_AMBULATORY_CARE_PROVIDER_SITE_OTHER): Payer: Medicaid Other | Admitting: Advanced Practice Midwife

## 2023-08-28 VITALS — BP 112/75 | HR 82 | Wt 133.0 lb

## 2023-08-28 DIAGNOSIS — Z3682 Encounter for antenatal screening for nuchal translucency: Secondary | ICD-10-CM

## 2023-08-28 DIAGNOSIS — Z3480 Encounter for supervision of other normal pregnancy, unspecified trimester: Secondary | ICD-10-CM

## 2023-08-28 DIAGNOSIS — Z3481 Encounter for supervision of other normal pregnancy, first trimester: Secondary | ICD-10-CM

## 2023-08-28 DIAGNOSIS — Z363 Encounter for antenatal screening for malformations: Secondary | ICD-10-CM | POA: Diagnosis not present

## 2023-08-28 DIAGNOSIS — Z3A12 12 weeks gestation of pregnancy: Secondary | ICD-10-CM | POA: Diagnosis not present

## 2023-08-28 DIAGNOSIS — Z3490 Encounter for supervision of normal pregnancy, unspecified, unspecified trimester: Secondary | ICD-10-CM | POA: Insufficient documentation

## 2023-08-28 MED ORDER — BLOOD PRESSURE MONITOR MISC
0 refills | Status: AC
Start: 2023-08-28 — End: ?

## 2023-08-28 NOTE — Patient Instructions (Signed)
Morgan [name], thank you for choosing our office today! We appreciate the opportunity to meet your healthcare needs. You may receive [name] short survey by mail, e-mail, or through [employer]. If you are happy with your care we would appreciate if you could take just [name] to complete the survey questions. We read all of your comments and take your feedback very seriously. Thank you again for choosing our office.  Center for [employer] [facility] Team at [facility]  [facility] at [facility] ([address] [place], [place] [address]) Entrance C, located off of [address] Free 24/7 valet parking   Nausea & Vomiting Have saltine crackers or pretzels by your bed and eat [name] few bites before you raise your head out of bed in the morning Eat small frequent meals throughout the day instead of large meals Drink plenty of fluids throughout the day to stay hydrated, just don't drink [name] lot of fluids with your meals.  This can make your stomach fill up faster making you feel sick Do not brush your teeth right after you eat Products with real ginger are good for nausea, like ginger ale and ginger hard candy Make sure it says made with real ginger! Sucking on sour candy like lemon heads is also good for nausea If your prenatal vitamins make you nauseated, take them at night so you will sleep through the nausea Sea Bands If you feel like you need medicine for the nausea & vomiting please let us know If you are unable to keep any fluids or food down please let us know   Constipation Drink plenty of fluid, preferably water, throughout the day Eat foods high in fiber such as fruits, vegetables, and grains Exercise, such as walking, is [name] good way to keep your bowels regular Drink warm fluids, especially warm prune juice, or decaf coffee Eat [name] 1/2 cup of real oatmeal (not instant), 1/2 cup applesauce, and 1/2-1 cup warm prune juice every day If needed, you may take Colace (docusate sodium) stool  softener once or twice [name] day to help keep the stool soft.  If you still are having problems with constipation, you may take Miralax once daily as needed to help keep your bowels regular.   Home Blood Pressure Monitoring for Patients   Your provider has recommended that you check your blood pressure (BP) at least once [name] week at home. If you do not have [name] blood pressure cuff at home, one will be provided for you. Contact your provider if you have not received your monitor within 1 week.   Helpful Tips for Accurate Home Blood Pressure Checks  Don't smoke, exercise, or drink caffeine 30 minutes before checking your BP Use the restroom before checking your BP ([name]) Relax in [name] Feet on the ground Left arm resting comfortably on [name] at the level of your heart Legs uncrossed Back supported Sit quietly and don't talk Place the cuff on your bare arm Adjust snuggly, so that only two fingertips can fit between your skin and the top of the cuff Check 2 readings separated by at least one minute Keep [name] For [name] visual, please reference this diagram: http://ccnc.care/bpdiagram  Provider Name: [name]     Phone: [phone]  Zone 1: ALL CLEAR  Continue to monitor your symptoms:  BP reading is less than 140 (top number) or less than 90 (  bottom number)  No right upper stomach pain No headaches or seeing spots No feeling nauseated or throwing up No swelling in face and hands  Zone 2: CAUTION Call your doctor's office for any of the following:  BP reading is greater than 140 (top number) or greater than 90 (bottom number)  Stomach pain under your ribs in the middle or right side Headaches or seeing spots Feeling nauseated or throwing up Swelling in face and hands  Zone 3: EMERGENCY  Seek immediate medical care if you have any of the following:  BP reading is greater than160 (top number) or  greater than 110 (bottom number) Severe headaches not improving with Tylenol Serious difficulty catching your breath Any worsening symptoms from Zone 2    First Trimester of Pregnancy The first trimester of pregnancy is from week 1 until the end of week 12 (months 1 through 3). [name] week after [name] sperm fertilizes an egg, the egg will implant on the wall of the uterus. This embryo will begin to develop into [name] baby. Genes from you and your partner are forming the baby. The female genes determine whether the baby is [name] boy or [name] girl. At 6-8 weeks, the eyes and face are formed, and the heartbeat can be seen on ultrasound. At the end of 12 weeks, all the baby's organs are formed.  Now that you are pregnant, you will want to do everything you can to have [name] healthy baby. Two of the most important things are to get good prenatal care and to follow your health care provider's instructions. Prenatal care is all the medical care you receive before the baby's birth. This care will help prevent, find, and treat any problems during the pregnancy and childbirth. BODY CHANGES Your body goes through many changes during pregnancy. The changes vary from woman to woman.  You may gain or lose [name] couple of pounds at first. You may feel sick to your stomach (nauseous) and throw up (vomit). If the vomiting is uncontrollable, call your health care provider. You may tire easily. You may develop headaches that can be relieved by medicines approved by your health care provider. You may urinate more often. Painful urination may mean you have [name] bladder infection. You may develop heartburn as [name] result of your pregnancy. You may develop constipation because certain hormones are causing the muscles that push waste through your intestines to slow down. You may develop hemorrhoids or swollen, bulging veins (varicose veins). Your breasts may begin to grow larger and become tender. Your nipples may stick out more, and the tissue that  surrounds them (areola) may become darker. Your gums may bleed and may be sensitive to brushing and flossing. Dark spots or blotches (chloasma, mask of pregnancy) may develop on your face. This will likely fade after the baby is born. Your menstrual periods will stop. You may have [name] loss of appetite. You may develop cravings for certain kinds of food. You may have changes in your emotions from day to day, such as being excited to be pregnant or being concerned that something may go wrong with the pregnancy and baby. You may have more vivid and strange dreams. You may have changes in your hair. These can include thickening of your hair, rapid growth, and changes in texture. Some women also have hair loss during or after pregnancy, or hair that feels dry or thin. Your hair will most likely return to normal after your baby is born. WHAT TO EXPECT AT [facility]  PRENATAL VISITS During [name] routine prenatal visit: You will be weighed to make sure you and the baby are growing normally. Your blood pressure will be taken. Your abdomen will be measured to track your baby's growth. The fetal heartbeat will be listened to starting around week 10 or 12 of your pregnancy. Test results from any previous visits will be discussed. Your health care provider may ask you: How you are feeling. If you are feeling the baby move. If you have had any abnormal symptoms, such as leaking fluid, bleeding, severe headaches, or abdominal cramping. If you have any questions. Other tests that may be performed during your first trimester include: Blood tests to find your blood type and to check for the presence of any previous infections. They will also be used to check for low iron levels (anemia) and Rh antibodies. Later in the pregnancy, blood tests for diabetes will be done along with other tests if problems develop. Urine tests to check for infections, diabetes, or protein in the urine. An ultrasound to confirm the proper growth  and development of the baby. An amniocentesis to check for possible genetic problems. Fetal screens for spina bifida and Down syndrome. You may need other tests to make sure you and the baby are doing well. HOME CARE INSTRUCTIONS  Medicines Follow your health care provider's instructions regarding medicine use. Specific medicines may be either safe or unsafe to take during pregnancy. Take your prenatal vitamins as directed. If you develop constipation, try taking [name] stool softener if your health care provider approves. Diet Eat regular, well-balanced meals. Choose [name] variety of foods, such as meat or vegetable-based protein, fish, milk and low-fat dairy products, vegetables, fruits, and whole grain breads and cereals. Your health care provider will help you determine the amount of weight gain that is right for you. Avoid raw meat and uncooked cheese. These carry germs that can cause birth defects in the baby. Eating four or five small meals rather than three large meals [name] day may help relieve nausea and vomiting. If you start to feel nauseous, eating [name] few soda crackers can be helpful. Drinking liquids between meals instead of during meals also seems to help nausea and vomiting. If you develop constipation, eat more high-fiber foods, such as fresh vegetables or fruit and whole grains. Drink enough fluids to keep your urine clear or pale yellow. Activity and Exercise Exercise only as directed by your health care provider. Exercising will help you: Control your weight. Stay in shape. Be prepared for labor and delivery. Experiencing pain or cramping in the lower abdomen or low back is [name] good sign that you should stop exercising. Check with your health care provider before continuing normal exercises. Try to avoid standing for long periods of time. Move your legs often if you must stand in one place for [name] long time. Avoid heavy lifting. Wear low-heeled shoes, and practice good posture. You may  continue to have sex unless your health care provider directs you otherwise. Relief of Pain or Discomfort Wear [name] good support bra for breast tenderness.   Take warm sitz baths to soothe any pain or discomfort caused by hemorrhoids. Use hemorrhoid cream if your health care provider approves.   Rest with your legs elevated if you have leg cramps or low back pain. If you develop varicose veins in your legs, wear support hose. Elevate your feet for 15 minutes, 3-4 times [name] day. Limit salt in your diet. Prenatal Care Schedule your prenatal visits by  the twelfth week of pregnancy. They are usually scheduled monthly at first, then more often in the last 2 months before delivery. Write down your questions. Take them to your prenatal visits. Keep all your prenatal visits as directed by your health care provider. Safety Wear your seat belt at all times when driving. Make [name] list of emergency phone numbers, including numbers for family, friends, the hospital, and police and fire departments. General Tips Ask your health care provider for [name] referral to [name] local prenatal education class. Begin classes no later than at the beginning of month 6 of your pregnancy. Ask for help if you have counseling or nutritional needs during pregnancy. Your health care provider can offer advice or refer you to specialists for help with various needs. Do not use hot tubs, steam rooms, or saunas. Do not douche or use tampons or scented sanitary pads. Do not cross your legs for long periods of time. Avoid cat litter boxes and soil used by cats. These carry germs that can cause birth defects in the baby and possibly loss of the fetus by miscarriage or stillbirth. Avoid all smoking, herbs, alcohol, and medicines not prescribed by your health care provider. Chemicals in these affect the formation and growth of the baby. Schedule [name] dentist appointment. At home, brush your teeth with [name] soft toothbrush and be gentle when you floss. SEEK  MEDICAL CARE IF:  You have dizziness. You have mild pelvic cramps, pelvic pressure, or nagging pain in the abdominal area. You have persistent nausea, vomiting, or diarrhea. You have [name] bad smelling vaginal discharge. You have pain with urination. You notice increased swelling in your face, hands, legs, or ankles. SEEK IMMEDIATE MEDICAL CARE IF:  You have [name] fever. You are leaking fluid from your vagina. You have spotting or bleeding from your vagina. You have severe abdominal cramping or pain. You have rapid weight gain or loss. You vomit blood or material that looks like coffee grounds. You are exposed to [place] measles and have never had them. You are exposed to fifth disease or chickenpox. You develop [name] severe headache. You have shortness of breath. You have any kind of trauma, such as from [name] fall or [name] car accident. Document Released: 12/04/2001 Document Revised: 04/26/2014 Document Reviewed: 10/20/2013 [facility] Patient Information 2015 [place], [place]. This information is not intended to replace advice given to you by your health care provider. Make sure you discuss any questions you have with your health care provider.

## 2023-08-28 NOTE — Progress Notes (Signed)
INITIAL OBSTETRICAL VISIT [name] MRN [id number]  Date of birth: [birth year] Chief Complaint:   Initial Prenatal Visit  History of Present Illness:   Morgan [name] is a 27 y.o. G97P0010  female at [redacted]w[redacted]d by LMP c/w u/s at 7.6 weeks with an Estimated Date of Delivery: 03/05/24 being seen today for her initial obstetrical visit.   Patient's last menstrual period was 05/30/2023. Her obstetrical history is significant for  SAB March 2024 .   Today she reports no complaints.  Last pap Jan 2023. Results were: NILM w/ HRHPV not done     08/28/2023    3:17 PM 06/25/2023    2:08 PM 03/26/2023    2:07 PM 01/29/2023   11:56 AM 02/04/2019    3:35 PM  Depression screen PHQ 2/9  Decreased Interest 1 2 1 3  0  Down, Depressed, Hopeless 0 0 1 1 0  PHQ - 2 Score 1 2 2 4  0  Altered sleeping 1 2 3 1    Tired, decreased energy 3 2 2 3    Change in appetite 2 2 3 2    Feeling bad or failure about yourself  0 0 0 3   Trouble concentrating 0 2 0 2   Moving slowly or fidgety/restless 0 0 0 1   Suicidal thoughts 0 0 0 0   PHQ-9 Score 7 10 10 16          08/28/2023    3:18 PM 06/25/2023    2:09 PM 03/26/2023    2:08 PM 01/29/2023   11:57 AM  GAD 7 : Generalized Anxiety Score  Nervous, Anxious, on Edge 1 2 0 1  Control/stop worrying 1 2 0 1  Worry too much - different things 1 2 1 1   Trouble relaxing 1 2 2 2   Restless 2 2 2 1   Easily annoyed or irritable 3 2 0 1  Afraid - awful might happen 0 0 0 0  Total GAD 7 Score 9 12 5 7      Review of Systems:   Pertinent items are noted in HPI Denies cramping/contractions, leakage of fluid, vaginal bleeding, abnormal vaginal discharge w/ itching/odor/irritation, headaches, visual changes, shortness of breath, chest pain, abdominal pain, severe nausea/vomiting, or problems with urination or bowel movements unless otherwise stated above.  Pertinent History Reviewed:  Reviewed past medical,surgical, social, obstetrical and family history.  Reviewed  problem list, medications and allergies. OB History  Gravida Para Term Preterm AB Living  2 0 0 0 1 0  SAB IAB Ectopic Multiple Live Births  1 0 0 0      # Outcome Date GA Lbr Len/2nd Weight Sex Type Anes PTL Lv  2 Current           1 SAB 03/02/23           Physical Assessment:   Vitals:   08/28/23 1440  BP: 112/75  Pulse: 82  Weight: 133 lb (60.3 kg)  Body mass index is 24.33 kg/m.       Physical Examination:  General appearance - well appearing, and in no distress  Mental status - alert, oriented to person, place, and time  Psych:  She has a normal mood and affect  Skin - warm and dry, normal color, no suspicious lesions noted  Chest - effort normal, all lung fields clear to auscultation bilaterally  Heart - normal rate and regular rhythm  Abdomen - soft, nontender  Extremities:  No swelling or varicosities noted  Pelvic - not indicated  Thin prep pap is not done    TODAY'S NT [place] 12+6 wks,measurements c/w dates,FHR 157 bpm,normal ovaries,NB present,NT 1.7 mm,CRL 71.46 mm   No results found for this or any previous visit (from the past 24 hour(s)).  Assessment & Plan:  1) Low-Risk Pregnancy G2P0010 at [redacted]w[redacted]d with an Estimated Date of Delivery: 03/05/24   2) Initial OB visit   Meds:  Meds ordered this encounter  Medications   Blood Pressure Monitor MISC    Sig: For regular home bp monitoring during pregnancy    Dispense:  1 each    Refill:  0    Z34.81 Please mail to patient    Initial labs obtained Continue prenatal vitamins Reviewed n/v relief measures and warning s/s to report Reviewed recommended weight gain based on pre-gravid BMI Encouraged well-balanced diet Genetic & carrier screening discussed: requests Panorama and NT/IT, requests Horizon  Ultrasound discussed; fetal survey: requested CCNC completed> form faxed if has or is planning to apply for medicaid The nature of [place] [facility] [employer] with multiple MDs and other Advanced  Practice Providers was explained to patient; also emphasized that fellows, residents, and students are part of our team. Does not have home bp cuff. Office bp cuff given: no. Rx sent: yes. Check bp weekly, let us know if consistently >140/90.   No indications for ASA therapy or early HgbA1c (per uptodate)  Follow-up: Return for 4wks LROB & 2nd IT: 7wks LROB & anatomy u/s.   Orders Placed This Encounter  Procedures   Urine Culture   GC/Chlamydia Probe Amp   US OB Comp + 14 Wk   Integrated 1   CBC/D/Plt+RPR+Rh+ABO+RubIgG...   PANORAMA PRENATAL TEST   HORIZON CUSTOM    Arabella Merles [facility] 08/28/2023 3:30 PM

## 2023-08-28 NOTE — Progress Notes (Signed)
[place] 12+6 wks,measurements c/w dates,FHR 157 bpm,normal ovaries,NB present,NT 1.7 mm,CRL 71.46 mm

## 2023-08-30 LAB — CBC/D/PLT+RPR+RH+ABO+RUBIGG...
Antibody Screen: NEGATIVE
Basophils Absolute: 0 x10E3/uL (ref 0.0–0.2)
Basos: 0 %
EOS (ABSOLUTE): 0.3 x10E3/uL (ref 0.0–0.4)
Eos: 3 %
HCV Ab: NONREACTIVE
HIV Screen 4th Generation wRfx: NONREACTIVE
Hematocrit: 36.2 % (ref 34.0–46.6)
Hemoglobin: 13 g/dL (ref 11.1–15.9)
Hepatitis B Surface Ag: NEGATIVE
Immature Grans (Abs): 0 x10E3/uL (ref 0.0–0.1)
Immature Granulocytes: 0 %
Lymphocytes Absolute: 1.5 x10E3/uL (ref 0.7–3.1)
Lymphs: 20 %
MCH: 33.9 pg — ABNORMAL HIGH (ref 26.6–33.0)
MCHC: 35.9 g/dL — ABNORMAL HIGH (ref 31.5–35.7)
MCV: 94 fL (ref 79–97)
Monocytes Absolute: 0.5 x10E3/uL (ref 0.1–0.9)
Monocytes: 6 %
Neutrophils Absolute: 5.5 x10E3/uL (ref 1.4–7.0)
Neutrophils: 71 %
Platelets: 254 x10E3/uL (ref 150–450)
RBC: 3.84 x10E6/uL (ref 3.77–5.28)
RDW: 11.9 % (ref 11.7–15.4)
RPR Ser Ql: NONREACTIVE
Rh Factor: POSITIVE
Rubella Antibodies, IGG: 7.57 {index}
WBC: 7.8 x10E3/uL (ref 3.4–10.8)

## 2023-08-30 LAB — INTEGRATED 1
Crown Rump Length: 71.5 mm
Gest. Age on Collection Date: 13.1 wk
Maternal Age at EDD: 26.2 a
Nuchal Translucency (NT): 1.7 mm
Number of Fetuses: 1
PAPP-A Value: 2077.5 ng/mL
Weight: 133 [lb_av]

## 2023-08-30 LAB — GC/CHLAMYDIA PROBE AMP
Chlamydia trachomatis, NAA: NEGATIVE
Neisseria Gonorrhoeae by PCR: NEGATIVE

## 2023-08-30 LAB — URINE CULTURE: Organism ID, Bacteria: NO GROWTH

## 2023-08-30 LAB — HCV INTERPRETATION

## 2023-09-05 ENCOUNTER — Encounter: Payer: Self-pay | Admitting: *Deleted

## 2023-09-05 LAB — PANORAMA PRENATAL TEST FULL PANEL:PANORAMA TEST PLUS 5 ADDITIONAL MICRODELETIONS: FETAL FRACTION: 14.5

## 2023-09-08 LAB — HORIZON CUSTOM: REPORT SUMMARY: NEGATIVE

## 2023-09-25 ENCOUNTER — Encounter: Payer: Self-pay | Admitting: Advanced Practice Midwife

## 2023-09-25 ENCOUNTER — Ambulatory Visit (INDEPENDENT_AMBULATORY_CARE_PROVIDER_SITE_OTHER): Payer: Medicaid Other | Admitting: Advanced Practice Midwife

## 2023-09-25 VITALS — BP 92/65 | HR 102 | Wt 137.0 lb

## 2023-09-25 DIAGNOSIS — Z3A16 16 weeks gestation of pregnancy: Secondary | ICD-10-CM

## 2023-09-25 DIAGNOSIS — Z3480 Encounter for supervision of other normal pregnancy, unspecified trimester: Secondary | ICD-10-CM

## 2023-09-25 DIAGNOSIS — Z3482 Encounter for supervision of other normal pregnancy, second trimester: Secondary | ICD-10-CM

## 2023-09-25 DIAGNOSIS — Z1379 Encounter for other screening for genetic and chromosomal anomalies: Secondary | ICD-10-CM | POA: Diagnosis not present

## 2023-09-25 NOTE — Patient Instructions (Addendum)
[name] A, thank you for choosing our office today! We appreciate the opportunity to meet your healthcare needs. You may receive a short survey by mail, e-mail, or through [employer]. If you are happy with your care we would appreciate if you could take just a few minutes to complete the survey questions. We read all of your comments and take your feedback very seriously. Thank you again for choosing our office.  [facility] [employer] Team at [facility]   Considering [place]? Guide for patients at [facility] [employer] [facility]) Why consider waterbirth? Gentle birth for babies  Less pain medicine used in labor  May allow for passive descent/less pushing  May reduce perineal tears  More mobility and instinctive maternal position changes  Increased maternal relaxation   Is waterbirth safe? What are the risks of infection, drowning or other complications? Infection:  Very low risk (3.7 % for tub vs 4.8% for bed)  7 in 8000 waterbirths with documented infection  Poorly cleaned equipment most common cause  Slightly lower group B strep transmission rate  Drowning  Maternal:  Very low risk  Related to seizures or fainting  Newborn:  Very low risk. No evidence of increased risk of respiratory problems in multiple large studies  Physiological protection from breathing under water  Avoid underwater birth if there are any fetal complications  Once baby's head is out of the water, keep it out.  Birth complication  Some reports of cord trauma, but risk decreased by bringing baby to surface gradually  No evidence of increased risk of shoulder dystocia. Mothers can usually change positions faster in water than in a bed, possibly aiding the maneuvers to free the shoulder.   There are 2 things you MUST do to have a waterbirth with [facility]: Attend a waterbirth class at [employer] [facility] at [facility]   3rd Wednesday of every month from 7-9 pm (virtual during COVID) [employer] at [email or web address] or [email or web address] or by calling [phone] Bring [place] the certificate from the class to your prenatal appointment or send via MyChart Meet with a midwife at 36 weeks* to see if you can still plan a waterbirth and to sign the consent.   *We also recommend that you schedule as many of your prenatal visits with a midwife as possible.    Helpful information: You may want to bring a bathing suit top to the hospital to wear during labor but this is optional.  All other supplies are provided by the hospital. Please arrive at the hospital with signs of active labor, and do not wait at home until late in labor. It takes 45 min- 1 hour for fetal monitoring, and check in to your room to take place, plus transport and filling of the waterbirth tub.    Things that would prevent you from having a waterbirth: Premature, <37wks  Previous cesarean birth  Presence of thick meconium-stained fluid  Multiple gestation (Twins, triplets, etc.)  Uncontrolled diabetes or gestational diabetes requiring medication  Hypertension diagnosed in pregnancy or preexisting hypertension (gestational hypertension, preeclampsia, or chronic hypertension) Fetal growth restriction (your baby measures less than 10th percentile on ultrasound) Heavy vaginal bleeding  Non-reassuring fetal heart rate  Active infection (MRSA, etc.). Group B Strep is NOT a contraindication for waterbirth.  If your labor has to be induced and induction method requires continuous monitoring of the baby's heart rate  Other risks/issues identified by your obstetrical provider   Please remember that birth is unpredictable. Under  certain unforeseeable circumstances your provider may advise against giving birth in the tub. These decisions will be made on a case-by-case basis and with the safety of you and your baby as our highest priority.    Updated 03/28/22  [facility] at [facility] ([address] [place], [place] [address]) Entrance C, located off of [address] valet parking  Go to [email or web address] to register for FREE online childbirth classes  Call the office [phone]) or go to [facility] if: You begin to severe cramping Your water breaks.  Sometimes it is a big gush of fluid, sometimes it is just a trickle that keeps getting your panties wet or running down your legs You have vaginal bleeding.  It is normal to have a small amount of spotting if your cervix was checked.   [facility] Pediatricians/Family Doctors [facility]): [name], [phone]           [facility]: [address], [phone]                [facility]): [address], [phone] (call to ask if accepting patients) [facility] Department: [address], [place], [phone]    [facility] Pediatricians/Family Doctors [facility]): [phone], [phone] [name]: [address] [place], [phone] [facility]: [address], [phone]  [facility] Doctors  [facility]): [phone] [name]: [address], [phone]   [facility] Doctors [facility]: [address], [phone]  [facility] Doctors  [name]: [phone], [phone]  Home Blood Pressure Monitoring for Patients   Your provider has recommended that you check your blood pressure (BP) at least once a week at home. If you do not have a blood pressure cuff at home, one will be provided for you. Contact your provider if you have not received your monitor within 1 week.   Helpful Tips for Accurate Home Blood Pressure Checks  Don't smoke, exercise, or drink caffeine 30 minutes before checking your BP Use the restroom before checking  your BP (a full bladder can raise your pressure) Relax in a comfortable upright chair Feet on the ground Left arm resting comfortably on a flat surface at the level of your heart Legs uncrossed Back supported Sit quietly and don't talk Place the cuff on your bare arm Adjust snuggly, so that only two fingertips can fit between your skin and the top of the cuff Check 2 readings separated by at least one minute Keep a log of your BP readings For a visual, please reference this diagram: http://ccnc.care/bpdiagram  Provider Name: [name]     Phone: [phone]  Zone 1: ALL CLEAR  Continue to monitor your symptoms:  BP reading is less than 140 (top number) or less than 90 (bottom number)  No right upper stomach pain No headaches or seeing spots No feeling nauseated or throwing up No swelling in face and hands  Zone 2: CAUTION Call your doctor's office for any of the following:  BP reading is greater than 140 (top number) or greater than 90 (bottom number)  Stomach pain under your ribs in the middle or right side Headaches or seeing spots Feeling nauseated or throwing up Swelling in face and hands  Zone 3: EMERGENCY  Seek immediate medical care  if you have any of the following:  BP reading is greater than160 (top number) or greater than 110 (bottom number) Severe headaches not improving with Tylenol Serious difficulty catching your breath Any worsening symptoms from Zone 2     Second Trimester of Pregnancy The second trimester is from week 14 through week 27 (months 4 through 6). The second trimester is often a time when you feel your best. Your body has adjusted to being pregnant, and you begin to feel better physically. Usually, morning sickness has lessened or quit completely, you may have more energy, and you may have an increase in appetite. The second trimester is also a time when the fetus is growing rapidly. At the end of the sixth month, the fetus is about 9  inches long and weighs about 1 pounds. You will likely begin to feel the baby move (quickening) between 16 and 20 weeks of pregnancy. Body changes during your second trimester Your body continues to go through many changes during your second trimester. The changes vary from woman to woman. Your weight will continue to increase. You will notice your lower abdomen bulging out. You may begin to get stretch marks on your hips, abdomen, and breasts. You may develop headaches that can be relieved by medicines. The medicines should be approved by your health care provider. You may urinate more often because the fetus is pressing on your bladder. You may develop or continue to have heartburn as a result of your pregnancy. You may develop constipation because certain hormones are causing the muscles that push waste through your intestines to slow down. You may develop hemorrhoids or swollen, bulging veins (varicose veins). You may have back pain. This is caused by: Weight gain. Pregnancy hormones that are relaxing the joints in your pelvis. A shift in weight and the muscles that support your balance. Your breasts will continue to grow and they will continue to become tender. Your gums may bleed and may be sensitive to brushing and flossing. Dark spots or blotches (chloasma, mask of pregnancy) may develop on your face. This will likely fade after the baby is born. A dark line from your belly button to the pubic area (linea nigra) may appear. This will likely fade after the baby is born. You may have changes in your hair. These can include thickening of your hair, rapid growth, and changes in texture. Some women also have hair loss during or after pregnancy, or hair that feels dry or thin. Your hair will most likely return to normal after your baby is born.  What to expect at prenatal visits During a routine prenatal visit: You will be weighed to make sure you and the fetus are growing normally. Your  blood pressure will be taken. Your abdomen will be measured to track your baby's growth. The fetal heartbeat will be listened to. Any test results from the previous visit will be discussed.  Your health care provider may ask you: How you are feeling. If you are feeling the baby move. If you have had any abnormal symptoms, such as leaking fluid, bleeding, severe headaches, or abdominal cramping. If you are using any tobacco products, including cigarettes, chewing tobacco, and electronic cigarettes. If you have any questions.  Other tests that may be performed during your second trimester include: Blood tests that check for: Low iron levels (anemia). High blood sugar that affects pregnant women (gestational diabetes) between 8 and 28 weeks. Rh antibodies. This is to check for a protein on red  blood cells (Rh factor). Urine tests to check for infections, diabetes, or protein in the urine. An ultrasound to confirm the proper growth and development of the baby. An amniocentesis to check for possible genetic problems. Fetal screens for spina bifida and Down syndrome. HIV (human immunodeficiency virus) testing. Routine prenatal testing includes screening for HIV, unless you choose not to have this test.  Follow these instructions at home: Medicines Follow your health care provider's instructions regarding medicine use. Specific medicines may be either safe or unsafe to take during pregnancy. Take a prenatal vitamin that contains at least 600 micrograms (mcg) of folic acid. If you develop constipation, try taking a stool softener if your health care provider approves. Eating and drinking Eat a balanced diet that includes fresh fruits and vegetables, whole grains, good sources of protein such as meat, eggs, or tofu, and low-fat dairy. Your health care provider will help you determine the amount of weight gain that is right for you. Avoid raw meat and uncooked cheese. These carry germs that can  cause birth defects in the baby. If you have low calcium intake from food, talk to your health care provider about whether you should take a daily calcium supplement. Limit foods that are high in fat and processed sugars, such as fried and sweet foods. To prevent constipation: Drink enough fluid to keep your urine clear or pale yellow. Eat foods that are high in fiber, such as fresh fruits and vegetables, whole grains, and beans. Activity Exercise only as directed by your health care provider. Most women can continue their usual exercise routine during pregnancy. Try to exercise for 30 minutes at least 5 days a week. Stop exercising if you experience uterine contractions. Avoid heavy lifting, wear low heel shoes, and practice good posture. A sexual relationship may be continued unless your health care provider directs you otherwise. Relieving pain and discomfort Wear a good support bra to prevent discomfort from breast tenderness. Take warm sitz baths to soothe any pain or discomfort caused by hemorrhoids. Use hemorrhoid cream if your health care provider approves. Rest with your legs elevated if you have leg cramps or low back pain. If you develop varicose veins, wear support hose. Elevate your feet for 15 minutes, 3-4 times a day. Limit salt in your diet. Prenatal Care Write down your questions. Take them to your prenatal visits. Keep all your prenatal visits as told by your health care provider. This is important. Safety Wear your seat belt at all times when driving. Make a list of emergency phone numbers, including numbers for family, friends, the hospital, and police and fire departments. General instructions Ask your health care provider for a referral to a local prenatal education class. Begin classes no later than the beginning of month 6 of your pregnancy. Ask for help if you have counseling or nutritional needs during pregnancy. Your health care provider can offer advice or refer you  to specialists for help with various needs. Do not use hot tubs, steam rooms, or saunas. Do not douche or use tampons or scented sanitary pads. Do not cross your legs for long periods of time. Avoid cat litter boxes and soil used by cats. These carry germs that can cause birth defects in the baby and possibly loss of the fetus by miscarriage or stillbirth. Avoid all smoking, herbs, alcohol, and unprescribed drugs. Chemicals in these products can affect the formation and growth of the baby. Do not use any products that contain nicotine or tobacco, such as  cigarettes and e-cigarettes. If you need help quitting, ask your health care provider. Visit your dentist if you have not gone yet during your pregnancy. Use a soft toothbrush to brush your teeth and be gentle when you floss. Contact a health care provider if: You have dizziness. You have mild pelvic cramps, pelvic pressure, or nagging pain in the abdominal area. You have persistent nausea, vomiting, or diarrhea. You have a bad smelling vaginal discharge. You have pain when you urinate. Get help right away if: You have a fever. You are leaking fluid from your vagina. You have spotting or bleeding from your vagina. You have severe abdominal cramping or pain. You have rapid weight gain or weight loss. You have shortness of breath with chest pain. You notice sudden or extreme swelling of your face, hands, ankles, feet, or legs. You have not felt your baby move in over an hour. You have severe headaches that do not go away when you take medicine. You have vision changes. Summary The second trimester is from week 14 through week 27 (months 4 through 6). It is also a time when the fetus is growing rapidly. Your body goes through many changes during pregnancy. The changes vary from woman to woman. Avoid all smoking, herbs, alcohol, and unprescribed drugs. These chemicals affect the formation and growth your baby. Do not use any tobacco  products, such as cigarettes, chewing tobacco, and e-cigarettes. If you need help quitting, ask your health care provider. Contact your health care provider if you have any questions. Keep all prenatal visits as told by your health care provider. This is important. This information is not intended to replace advice given to you by your health care provider. Make sure you discuss any questions you have with your health care provider. Document Released: 12/04/2001 Document Revised: 05/17/2016 Document Reviewed: 02/10/2013 Elsevier Interactive Patient Education  2017 [employer].

## 2023-09-25 NOTE — Progress Notes (Signed)
   LOW-RISK PREGNANCY VISIT Patient name: SAPHYRE CILLO MRN [id number]  Date of birth: 12/13/1996 Chief Complaint:   Routine Prenatal Visit (2nd IT)  History of Present Illness:   Morgan [name] is a 27 y.o. G47P0010 female at [redacted]w[redacted]d with an Estimated Date of Delivery: 03/05/24 being seen today for ongoing management of a low-risk pregnancy.  Today she reports no complaints. Contractions: Not present.  .  Movement: Present. denies leaking of fluid. Review of Systems:   Pertinent items are noted in HPI Denies abnormal vaginal discharge w/ itching/odor/irritation, headaches, visual changes, shortness of breath, chest pain, abdominal pain, severe nausea/vomiting, or problems with urination or bowel movements unless otherwise stated above. Pertinent History Reviewed:  Reviewed past medical,surgical, social, obstetrical and family history.  Reviewed problem list, medications and allergies. Physical Assessment:   Vitals:   09/25/23 1453  BP: 92/65  Pulse: (!) 102  Weight: 137 lb (62.1 kg)  Body mass index is 25.06 kg/m.        Physical Examination:   General appearance: Well appearing, and in no distress  Mental status: Alert, oriented to person, place, and time  Skin: Warm & dry  Cardiovascular: Normal heart rate noted  Respiratory: Normal respiratory effort, no distress  Abdomen: Soft, gravid, nontender  Pelvic: Cervical exam deferred         Extremities: Edema: None  Fetal Status: Fetal Heart Rate (bpm): 153   Movement: Present    No results found for this or any previous visit (from the past 24 hour(s)).  Assessment & Plan:  1) Low-risk pregnancy G2P0010 at [redacted]w[redacted]d with an Estimated Date of Delivery: 03/05/24   2) Interested in waterbirth, - Pt is interested in waterbirth.  No contraindications at this time per chart review/patient assessment.   - Pt to enroll in class, see CNMs for most visits in the office.  - Discussed waterbirth as option for low-risk pregnancy.  Reviewed  conditions that may arise during pregnancy that will risk pt out of waterbirth including hypertension, diabetes, fetal growth restriction <10%ile, etc.    Meds: No orders of the defined types were placed in this encounter.  Labs/procedures today: 2nd IT  Plan:  Continue routine obstetrical care   Reviewed: Preterm labor symptoms and general obstetric precautions including but not limited to vaginal bleeding, contractions, leaking of fluid and fetal movement were reviewed in detail with the patient.  All questions were answered. Didn't ask about home bp cuff. Check bp weekly, let us know if >140/90.   Follow-up: Return for As scheduled.  Orders Placed This Encounter  Procedures   INTEGRATED 2   Arabella Merles [facility] 09/25/2023 3:18 PM

## 2023-09-27 LAB — INTEGRATED 2
AFP MoM: 1.44
Alpha-Fetoprotein: 56.1 ng/mL
Crown Rump Length: 71.5 mm
DIA MoM: 1.08
DIA Value: 187.2 pg/mL
Estriol, Unconjugated: 1.88 ng/mL
Gest. Age on Collection Date: 13.1 wk
Gestational Age: 17.1 wk
Maternal Age at EDD: 26.2 a
Nuchal Translucency (NT): 1.7 mm
Nuchal Translucency MoM: 1.04
Number of Fetuses: 1
PAPP-A MoM: 1.46
PAPP-A Value: 2077.5 ng/mL
Test Results:: NEGATIVE
Weight: 133 [lb_av]
Weight: 133 [lb_av]
hCG MoM: 1.59
hCG Value: 49 [IU]/mL
uE3 MoM: 1.69

## 2023-09-30 ENCOUNTER — Ambulatory Visit
Admission: RE | Admit: 2023-09-30 | Discharge: 2023-09-30 | Disposition: A | Discharge status: Home / Self Care | Payer: Medicaid Other | Source: Ambulatory Visit | Attending: Nurse Practitioner | Admitting: Nurse Practitioner

## 2023-09-30 VITALS — BP 109/71 | HR 76 | Temp 98.3°F | Resp 17

## 2023-09-30 DIAGNOSIS — Z1152 Encounter for screening for COVID-19: Secondary | ICD-10-CM | POA: Diagnosis not present

## 2023-09-30 DIAGNOSIS — B349 Viral infection, unspecified: Secondary | ICD-10-CM | POA: Diagnosis not present

## 2023-09-30 LAB — POCT RAPID STREP A (OFFICE): Rapid Strep A Screen: NEGATIVE

## 2023-09-30 NOTE — ED Triage Notes (Signed)
Pt c/o sore throat and congestion x2 days

## 2023-09-30 NOTE — Discharge Instructions (Addendum)
You have a viral upper respiratory infection.  Symptoms should improve over the next week to 10 days.  If you develop chest pain or shortness of breath, go to the emergency room.  We have tested you today for COVID-19.  You will see the results in Mychart and we will contact you with positive results.  Please stay home and isolate until you are fever free for 24 hours without fever reducing medication.    Some things that can make you feel better are: - Increased rest - Increasing fluid with water/sugar free electrolytes - Acetaminophen as needed for fever/pain - Salt water gargling, chloraseptic spray and throat lozenges for sore throat - OTC guaifenesin (Mucinex) 600 mg twice daily - Saline sinus flushes or a neti pot - Humidifying the air   OTC Medications safe in pregnancy: - acetaminophen - cepacol throat lozenges for sore throat - Coricidin HBP: chest congestion/cough, cold/flu - alcohol free cough drops - dextromethorphan - guaifenesin - Neti pot/saline nasal rinses - Vicks vapo rub

## 2023-09-30 NOTE — ED Provider Notes (Signed)
[facility]    CSN: [id number] Arrival date & time: 09/30/23  1507      History   Chief Complaint Chief Complaint  Patient presents with   Sore Throat    Congested. - Entered by patient    HPI Morgan [name] is a 27 y.o. female.   Patient presents today with 2-day history of bodyaches, runny and stuffy nose, sore throat, sinus pressure and headache, decreased appetite, and fatigue.  She denies known fevers, cough, shortness of breath or chest pain, abdominal pain, nausea/vomiting, diarrhea.  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  Has been drinking warm tea with lemon with some improvement in symptoms.  Patient is currently [redacted] weeks pregnant.    Past Medical History:  Diagnosis Date   Anxiety    Chlamydia 01/28/2020   Treated 01/28/20, POC___________   Depression    Dizziness and giddiness    GERD (gastroesophageal reflux disease)    Miscarriage     Patient Active Problem List   Diagnosis Date Noted   Encounter for supervision of normal pregnancy, antepartum 08/28/2023   Anxiety and depression 01/29/2023   Irritable bowel syndrome with diarrhea 06/06/2021   History of PID 05/30/2021    Past Surgical History:  Procedure Laterality Date   ROOT CANAL  03/11/2017    OB History     Gravida  2   Para  0   Term  0   Preterm  0   AB  1   Living  0      SAB  1   IAB  0   Ectopic  0   Multiple  0   Live Births               Home Medications    Prior to Admission medications   Medication Sig Start Date End Date Taking? Authorizing Provider  Blood Pressure Monitor MISC For regular home bp monitoring during pregnancy 08/28/23   Arabella Merles, CNM  Prenatal Vit-Fe Fumarate-FA (PRENATAL VITAMIN) 27-0.8 MG TABS Take 1 tablet by mouth daily. 06/25/23   Adline Potter, NP    Family History Family History  Problem Relation Age of Onset   Diabetes Father    Diabetes Paternal Aunt    Cancer Paternal Aunt     Alzheimer's disease Paternal Aunt    Diabetes Maternal Grandmother    Cancer Paternal Grandmother    Stroke Paternal Grandmother    Heart attack Paternal Grandmother    Diabetes Paternal Grandmother    Diabetes Other     Social History Social History   Tobacco Use   Smoking status: Former    Types: Cigarettes, Cigars   Smokeless tobacco: Never  Vaping Use   Vaping status: Former  Substance Use Topics   Alcohol use: Not Currently    Comment: occ   Drug use: Not Currently    Types: Marijuana     Allergies   Patient has no allergy information on record.   Review of Systems Review of Systems Per HPI  Physical Exam Triage Vital Signs ED Triage Vitals  Encounter Vitals Group     BP 09/30/23 1521 109/71     Systolic BP Percentile --      Diastolic BP Percentile --      Pulse Rate 09/30/23 1521 76     Resp 09/30/23 1521 17     Temp 09/30/23 1521 98.3 F (36.8 C)     Temp Source 09/30/23  1521 Oral     SpO2 09/30/23 1521 95 %     Weight --      Height --      Head Circumference --      Peak Flow --      Pain Score 09/30/23 1524 8     Pain Loc --      Pain Education --      Exclude from Growth Chart --    No data found.  Updated Vital Signs BP 109/71 (BP Location: Right Arm)   Pulse 76   Temp 98.3 F (36.8 C) (Oral)   Resp 17   LMP 05/30/2023   SpO2 95%   Visual Acuity Right Eye Distance:   Left Eye Distance:   Bilateral Distance:    Right Eye Near:   Left Eye Near:    Bilateral Near:     Physical Exam Vitals and nursing note reviewed.  Constitutional:      General: She is not in acute distress.    Appearance: Normal appearance. She is not ill-appearing or toxic-appearing.  HENT:     Head: Normocephalic and atraumatic.     Right Ear: Tympanic membrane, ear canal and external ear normal. No drainage, swelling or tenderness. No middle ear effusion. Tympanic membrane is not erythematous.     Left Ear: Tympanic membrane, ear canal and external ear  normal. No drainage, swelling or tenderness.  No middle ear effusion. Tympanic membrane is not erythematous.     Nose: No congestion or rhinorrhea.     Mouth/Throat:     Mouth: Mucous membranes are moist.     Pharynx: Oropharynx is clear. No oropharyngeal exudate or posterior oropharyngeal erythema.     Tonsils: No tonsillar exudate.  Eyes:     General: No scleral icterus.    Extraocular Movements: Extraocular movements intact.  Cardiovascular:     Rate and Rhythm: Normal rate and regular rhythm.  Pulmonary:     Effort: Pulmonary effort is normal. No respiratory distress.     Breath sounds: Normal breath sounds. No wheezing, rhonchi or rales.  Abdominal:     General: Abdomen is flat. Bowel sounds are normal. There is no distension.     Palpations: Abdomen is soft.  Musculoskeletal:     Cervical back: Normal range of motion and neck supple.  Lymphadenopathy:     Cervical: No cervical adenopathy.  Skin:    General: Skin is warm and dry.     Coloration: Skin is not jaundiced or pale.     Findings: No erythema or rash.  Neurological:     Mental Status: She is alert and oriented to person, place, and time.     Motor: No weakness.  Psychiatric:        Mood and Affect: Mood normal.        Behavior: Behavior normal.      UC Treatments / Results  Labs (all labs ordered are listed, but only abnormal results are displayed) Labs Reviewed  SARS CORONAVIRUS 2 (TAT 6-24 HRS)  POCT RAPID STREP A (OFFICE)    EKG   Radiology No results found.  Procedures Procedures (including critical care time)  Medications Ordered in UC Medications - No data to display  Initial Impression / Assessment and Plan / UC Course  I have reviewed the triage vital signs and the nursing notes.  Pertinent labs & imaging results that were available during my care of the patient were reviewed by me and considered in my medical  decision making (see chart for details).   Patient is well-appearing,  normotensive, afebrile, not tachycardic, not tachypneic, oxygenating well on room air.    1. Viral illness 2. Encounter for screening for COVID-19 Suspect viral etiology Vitals and exam are reassuring Rapid strep negative, throat culture deferred; Centor score 1 COVID-19 testing obtained Supportive care discussed with patient and over-the-counter medications safe in pregnancy discussed Work excuse provided  The patient was given the opportunity to ask questions.  All questions answered to their satisfaction.  The patient is in agreement to this plan.    Final Clinical Impressions(s) / UC Diagnoses   Final diagnoses:  Viral illness  Encounter for screening for COVID-19     Discharge Instructions      You have a viral upper respiratory infection.  Symptoms should improve over the next week to 10 days.  If you develop chest pain or shortness of breath, go to the emergency room.  We have tested you today for COVID-19.  You will see the results in Mychart and we will contact you with positive results.  Please stay home and isolate until you are fever free for 24 hours without fever reducing medication.    Some things that can make you feel better are: - Increased rest - Increasing fluid with water/sugar free electrolytes - Acetaminophen as needed for fever/pain - Salt water gargling, chloraseptic spray and throat lozenges for sore throat - OTC guaifenesin (Mucinex) 600 mg twice daily - Saline sinus flushes or a neti pot - Humidifying the air   OTC Medications safe in pregnancy: - acetaminophen - cepacol throat lozenges for sore throat - Coricidin HBP: chest congestion/cough, cold/flu - alcohol free cough drops - dextromethorphan - guaifenesin - Neti pot/saline nasal rinses - Vicks vapo rub      ED Prescriptions   None    PDMP not reviewed this encounter.   Valentino Nose, NP 09/30/23 [phone]

## 2023-10-01 LAB — SARS CORONAVIRUS 2 (TAT 6-24 HRS): SARS Coronavirus 2: NEGATIVE

## 2023-10-07 ENCOUNTER — Encounter: Payer: Self-pay | Admitting: Obstetrics & Gynecology

## 2023-10-14 ENCOUNTER — Encounter: Payer: Self-pay | Admitting: Obstetrics & Gynecology

## 2023-10-16 ENCOUNTER — Encounter: Payer: Self-pay | Admitting: Advanced Practice Midwife

## 2023-10-16 ENCOUNTER — Other Ambulatory Visit (HOSPITAL_COMMUNITY)
Admission: RE | Admit: 2023-10-16 | Discharge: 2023-10-16 | Disposition: A | Discharge status: Home / Self Care | Payer: BC Managed Care – PPO | Source: Ambulatory Visit | Attending: Advanced Practice Midwife | Admitting: Advanced Practice Midwife

## 2023-10-16 ENCOUNTER — Ambulatory Visit (INDEPENDENT_AMBULATORY_CARE_PROVIDER_SITE_OTHER): Payer: BC Managed Care – PPO | Admitting: Radiology

## 2023-10-16 ENCOUNTER — Ambulatory Visit (INDEPENDENT_AMBULATORY_CARE_PROVIDER_SITE_OTHER): Payer: Medicaid Other | Admitting: Advanced Practice Midwife

## 2023-10-16 VITALS — BP 98/63 | HR 77 | Wt 144.0 lb

## 2023-10-16 DIAGNOSIS — N898 Other specified noninflammatory disorders of vagina: Secondary | ICD-10-CM | POA: Insufficient documentation

## 2023-10-16 DIAGNOSIS — Z3480 Encounter for supervision of other normal pregnancy, unspecified trimester: Secondary | ICD-10-CM

## 2023-10-16 DIAGNOSIS — R3989 Other symptoms and signs involving the genitourinary system: Secondary | ICD-10-CM

## 2023-10-16 DIAGNOSIS — Z363 Encounter for antenatal screening for malformations: Secondary | ICD-10-CM | POA: Diagnosis not present

## 2023-10-16 DIAGNOSIS — Z3A19 19 weeks gestation of pregnancy: Secondary | ICD-10-CM | POA: Diagnosis present

## 2023-10-16 DIAGNOSIS — O26892 Other specified pregnancy related conditions, second trimester: Secondary | ICD-10-CM | POA: Diagnosis present

## 2023-10-16 LAB — POCT URINALYSIS DIPSTICK OB
Blood, UA: NEGATIVE
Glucose, UA: NEGATIVE
Ketones, UA: NEGATIVE
Leukocytes, UA: NEGATIVE
Nitrite, UA: NEGATIVE
POC,PROTEIN,UA: NEGATIVE

## 2023-10-16 NOTE — Progress Notes (Signed)
LOW-RISK PREGNANCY VISIT [name] MRN [id number]  Date of birth: 1996/06/11 Chief Complaint:   Routine Prenatal Visit (Vaginal irritation, discharge/)  History of Present Illness:   Morgan [name] is a 27 y.o. G63P0010 female at [redacted]w[redacted]d with an Estimated Date of Delivery: 03/05/24 being seen today for ongoing management of a low-risk pregnancy.  Today she reports vaginal irritation/pressure.  . Vag. Bleeding: None.  Movement: Present. denies leaking of fluid. Review of Systems:   Pertinent items are noted in HPI Denies abnormal vaginal discharge w/ itching/odor/irritation, headaches, visual changes, shortness of breath, chest pain, abdominal pain, severe nausea/vomiting, or problems with urination or bowel movements unless otherwise stated above. Pertinent History Reviewed:  Reviewed past medical,surgical, social, obstetrical and family history.  Reviewed problem list, medications and allergies. Physical Assessment:   Vitals:   10/16/23 1056  BP: 98/63  Pulse: 77  Weight: 144 lb (65.3 kg)  Body mass index is 26.34 kg/m.        Physical Examination:   General appearance: Well appearing, and in no distress  Mental status: Alert, oriented to person, place, and time  Skin: Warm & dry  Cardiovascular: Normal heart rate noted  Respiratory: Normal respiratory effort, no distress  Abdomen: Soft, gravid, nontender  Pelvic: Cervical exam deferred         Extremities: Edema: None  Fetal Status: Fetal Heart Rate (bpm): 155 u/s   Movement: Present    Anatomy u/s: [place]  19+6 Single active female fetus,  cephalic   FHR = [id number] Anterior placenta,  central cord ins,  normal amn fluid vol Anatomy screen completed - no apparent abn EFW 38%   309g CL = 3.9 cm   closed   Normal ov's,  Neg Adnexal regions - Neg CDS - no free fluid  Results for orders placed or performed in visit on 10/16/23 (from the past 24 hour(s))  POC Urinalysis Dipstick OB   Collection Time: 10/16/23  2:36  PM  Result Value Ref Range   Color, UA     Clarity, UA     Glucose, UA Negative Negative   Bilirubin, UA     Ketones, UA neg    Spec Grav, UA     Blood, UA neg    pH, UA     POC,PROTEIN,UA Negative Negative, Trace, Small (1+), Moderate (2+), Large (3+), 4+   Urobilinogen, UA     Nitrite, UA neg    Leukocytes, UA Negative Negative   Appearance     Odor      Assessment & Plan:  1) Low-risk pregnancy G2P0010 at [redacted]w[redacted]d with an Estimated Date of Delivery: 03/05/24   2) Vaginal irritation/pressure, neg UA; will send CV swab  3) Wants waterbirth,  Pt is interested in waterbirth.  No contraindications at this time per chart review/patient assessment.   - Pt to enroll in class, see CNMs for most visits in the office.  - Discussed waterbirth as option for low-risk pregnancy.  Reviewed conditions that may arise during pregnancy that will risk pt out of waterbirth including hypertension, diabetes, fetal growth restriction <10%ile, etc.    Meds: No orders of the defined types were placed in this encounter.  Labs/procedures today: CV swab; anatomy u/s; UA  Plan:  Continue routine obstetrical care   Reviewed: Preterm labor symptoms and general obstetric precautions including but not limited to vaginal bleeding, contractions, leaking of fluid and fetal movement were reviewed in detail with the patient.  All questions were  answered. Has home bp cuff. Check bp weekly, let us know if >140/90.   Follow-up: Return in about 4 weeks (around 11/13/2023) for LROB, in person.  Orders Placed This Encounter  Procedures   POC Urinalysis Dipstick OB   Arabella Merles [facility] 10/16/2023 2:38 PM

## 2023-10-16 NOTE — Progress Notes (Signed)
[place]  19+6 Single active female fetus,  cephalic   FHR = [id number] Anterior placenta,  central cord ins,  normal amn fluid vol Anatomy screen completed - no apparent abn EFW 38%   309g CL = 3.9 cm   closed   Normal ov's,  Neg Adnexal regions - Neg CDS - no free fluid

## 2023-10-16 NOTE — Patient Instructions (Signed)
Morgan [name], thank you for choosing our office today! We appreciate the opportunity to meet your healthcare needs. You may receive [name] short survey by mail, e-mail, or through [employer]. If you are happy with your care we would appreciate if you could take just [name] to complete the survey questions. We read all of your comments and take your feedback very seriously. Thank you again for choosing our office.  Center for [employer] Team at [facility] at [facility] ([address] [place], [place] [address]) Entrance C, located off of [address] Free 24/7 valet parking  Go to [email or web address] to register for FREE online childbirth classes  Call the office [phone]) or go to [facility] if: You begin to severe cramping Your water breaks.  Sometimes it is [name], sometimes it is just [name] that keeps getting your panties wet or running down your legs You have vaginal bleeding.  It is normal to have [name] if your cervix was checked.   [facility] Pediatricians/Family Doctors [facility]): [name], [phone]           [facility]: [name], [phone]                [facility]): [address], [phone] (call to ask if accepting patients) [facility] Department: [address], [place], [phone]    [facility] Pediatricians/Family Doctors [facility]): [phone] [address], [phone] [name]: [address] [place], [phone] [facility]: [address], [phone]  [name] Doctors  [employer] [facility]): [phone] [name]: [address], [phone]   [name] Doctors [facility]: [address] [phone]  [facility] Doctors  [facility]: [phone], [phone]  Home Blood Pressure Monitoring for Patients   Your provider has recommended that you check your blood pressure (BP) at least once [name] week at home. If you do not have [name] blood pressure cuff at home, one will be provided for you. Contact your provider if you have not received your monitor within 1 week.   Helpful Tips for Accurate Home Blood Pressure Checks  Don't smoke, exercise, or drink caffeine 30 minutes before checking your BP Use the restroom before checking your BP ([name]) Relax in [name] Feet on the ground Left arm resting comfortably on [name] at the level of your heart Legs uncrossed Back supported Sit quietly and don't talk Place the cuff on your bare arm Adjust snuggly, so that only two fingertips can fit between your skin and the top of the cuff Check 2 readings separated by at least one minute Keep [name] For [name] visual, please reference this diagram: http://ccnc.care/bpdiagram  Provider Name: [name]     Phone: [phone]  Zone 1: ALL CLEAR  Continue to monitor your symptoms:  BP reading is less than 140 (top number) or less than 90 (bottom number)  No right upper stomach pain No headaches or seeing spots No feeling nauseated or throwing up No swelling in face and hands  Zone 2: CAUTION Call your doctor's office for any of the following:  BP reading is greater than 140 (top number) or greater  than 90 (bottom number)  Stomach pain under your ribs in the middle or right side Headaches or seeing spots Feeling nauseated or throwing up Swelling in face and hands  Zone 3: EMERGENCY  Seek immediate medical care if you have any of the following:  BP reading is greater than160 (top number) or greater than 110 (bottom number) Severe headaches not improving with Tylenol Serious difficulty catching your breath Any worsening symptoms from  Zone 2     Second Trimester of Pregnancy The second trimester is from week 14 through week 27 (months 4 through 6). The second trimester is often [name] time when you feel your best. Your body has adjusted to being pregnant, and you begin to feel better physically. Usually, morning sickness has lessened or quit completely, you may have more energy, and you may have an increase in appetite. The second trimester is also [name] time when the fetus is growing rapidly. At the end of the sixth month, the fetus is about 9 inches long and weighs about 1 pounds. You will likely begin to feel the baby move (quickening) between 16 and 20 weeks of pregnancy. Body changes during your second trimester Your body continues to go through many changes during your second trimester. The changes vary from woman to woman. Your weight will continue to increase. You will notice your lower abdomen bulging out. You may begin to get stretch marks on your hips, abdomen, and breasts. You may develop headaches that can be relieved by medicines. The medicines should be approved by your health care provider. You may urinate more often because the fetus is pressing on your bladder. You may develop or continue to have heartburn as [name] result of your pregnancy. You may develop constipation because certain hormones are causing the muscles that push waste through your intestines to slow down. You may develop hemorrhoids or swollen, bulging veins (varicose veins). You may have back pain. This is caused by: Weight gain. Pregnancy hormones that are relaxing the joints in your pelvis. [name] shift in weight and the muscles that support your balance. Your breasts will continue to grow and they will continue to become tender. Your gums may bleed and may be sensitive to brushing and flossing. Dark spots or blotches (chloasma, mask of pregnancy) may develop on your face. This will likely fade after the baby is born. [name] dark line from your belly button to  the pubic area (linea nigra) may appear. This will likely fade after the baby is born. You may have changes in your hair. These can include thickening of your hair, rapid growth, and changes in texture. Some women also have hair loss during or after pregnancy, or hair that feels dry or thin. Your hair will most likely return to normal after your baby is born.  What to expect at prenatal visits During [name] routine prenatal visit: You will be weighed to make sure you and the fetus are growing normally. Your blood pressure will be taken. Your abdomen will be measured to track your baby's growth. The fetal heartbeat will be listened to. Any test results from the previous visit will be discussed.  Your health care provider may ask you: How you are feeling. If you are feeling the baby move. If you have had any abnormal symptoms, such as leaking fluid, bleeding, severe headaches, or abdominal cramping. If you are using any tobacco products, including cigarettes, chewing tobacco, and electronic cigarettes. If you have any questions.  Other tests that may be performed  during your second trimester include: Blood tests that check for: Low iron levels (anemia). High blood sugar that affects pregnant women (gestational diabetes) between 37 and 28 weeks. Rh antibodies. This is to check for [name] protein on red blood cells (Rh factor). Urine tests to check for infections, diabetes, or protein in the urine. An ultrasound to confirm the proper growth and development of the baby. An amniocentesis to check for possible genetic problems. Fetal screens for spina bifida and Down syndrome. HIV (human immunodeficiency virus) testing. Routine prenatal testing includes screening for HIV, unless you choose not to have this test.  Follow these instructions at home: Medicines Follow your health care provider's instructions regarding medicine use. Specific medicines may be either safe or unsafe to take during  pregnancy. Take [name] prenatal vitamin that contains at least 600 micrograms (mcg) of folic acid. If you develop constipation, try taking [name] stool softener if your health care provider approves. Eating and drinking Eat [name] balanced diet that includes fresh fruits and vegetables, whole grains, good sources of protein such as meat, eggs, or tofu, and low-fat dairy. Your health care provider will help you determine the amount of weight gain that is right for you. Avoid raw meat and uncooked cheese. These carry germs that can cause birth defects in the baby. If you have low calcium intake from food, talk to your health care provider about whether you should take [name] daily calcium supplement. Limit foods that are high in fat and processed sugars, such as fried and sweet foods. To prevent constipation: Drink enough fluid to keep your urine clear or pale yellow. Eat foods that are high in fiber, such as fresh fruits and vegetables, whole grains, and beans. Activity Exercise only as directed by your health care provider. Most women can continue their usual exercise routine during pregnancy. Try to exercise for 30 minutes at least 5 days [name] week. Stop exercising if you experience uterine contractions. Avoid heavy lifting, wear low heel shoes, and practice good posture. [name] sexual relationship may be continued unless your health care provider directs you otherwise. Relieving pain and discomfort Wear [name] good support bra to prevent discomfort from breast tenderness. Take warm sitz baths to soothe any pain or discomfort caused by hemorrhoids. Use hemorrhoid cream if your health care provider approves. Rest with your legs elevated if you have leg cramps or low back pain. If you develop varicose veins, wear support hose. Elevate your feet for 15 minutes, 3-4 times [name] day. Limit salt in your diet. Prenatal Care Write down your questions. Take them to your prenatal visits. Keep all your prenatal visits as told by your health  care provider. This is important. Safety Wear your seat belt at all times when driving. Make [name] list of emergency phone numbers, including numbers for family, friends, the hospital, and police and fire departments. General instructions Ask your health care provider for [name] referral to [name] local prenatal education class. Begin classes no later than the beginning of month 6 of your pregnancy. Ask for help if you have counseling or nutritional needs during pregnancy. Your health care provider can offer advice or refer you to specialists for help with various needs. Do not use hot tubs, steam rooms, or saunas. Do not douche or use tampons or scented sanitary pads. Do not cross your legs for long periods of time. Avoid cat litter boxes and soil used by cats. These carry germs that can cause birth defects in the baby and possibly loss of  the fetus by miscarriage or stillbirth. Avoid all smoking, herbs, alcohol, and unprescribed drugs. Chemicals in these products can affect the formation and growth of the baby. Do not use any products that contain nicotine or tobacco, such as cigarettes and e-cigarettes. If you need help quitting, ask your health care provider. Visit your dentist if you have not gone yet during your pregnancy. Use [name] soft toothbrush to brush your teeth and be gentle when you floss. Contact [name] health care provider if: You have dizziness. You have mild pelvic cramps, pelvic pressure, or nagging pain in the abdominal area. You have persistent nausea, vomiting, or diarrhea. You have [name] bad smelling vaginal discharge. You have pain when you urinate. Get help right away if: You have [name] fever. You are leaking fluid from your vagina. You have spotting or bleeding from your vagina. You have severe abdominal cramping or pain. You have rapid weight gain or weight loss. You have shortness of breath with chest pain. You notice sudden or extreme swelling of your face, hands, ankles, feet, or legs. You  have not felt your baby move in over an hour. You have severe headaches that do not go away when you take medicine. You have vision changes. Summary The second trimester is from week 14 through week 27 (months 4 through 6). It is also [name] time when the fetus is growing rapidly. Your body goes through many changes during pregnancy. The changes vary from woman to woman. Avoid all smoking, herbs, alcohol, and unprescribed drugs. These chemicals affect the formation and growth your baby. Do not use any tobacco products, such as cigarettes, chewing tobacco, and e-cigarettes. If you need help quitting, ask your health care provider. Contact your health care provider if you have any questions. Keep all prenatal visits as told by your health care provider. This is important. This information is not intended to replace advice given to you by your health care provider. Make sure you discuss any questions you have with your health care provider. Document Released: 12/04/2001 Document Revised: 05/17/2016 Document Reviewed: 02/10/2013 Elsevier Interactive Patient Education  2017 [employer].

## 2023-10-17 LAB — CERVICOVAGINAL ANCILLARY ONLY
Bacterial Vaginitis (gardnerella): NEGATIVE
Candida Glabrata: NEGATIVE
Candida Vaginitis: NEGATIVE
Chlamydia: NEGATIVE
Comment: NEGATIVE
Comment: NEGATIVE
Comment: NEGATIVE
Comment: NEGATIVE
Comment: NEGATIVE
Comment: NORMAL
Neisseria Gonorrhea: NEGATIVE
Trichomonas: NEGATIVE

## 2023-11-13 ENCOUNTER — Encounter: Payer: Self-pay | Admitting: Advanced Practice Midwife

## 2023-11-13 ENCOUNTER — Ambulatory Visit (INDEPENDENT_AMBULATORY_CARE_PROVIDER_SITE_OTHER): Payer: Medicaid Other | Admitting: Advanced Practice Midwife

## 2023-11-13 VITALS — BP 105/66 | HR 88 | Wt 146.6 lb

## 2023-11-13 DIAGNOSIS — Z3A23 23 weeks gestation of pregnancy: Secondary | ICD-10-CM

## 2023-11-13 DIAGNOSIS — Z3482 Encounter for supervision of other normal pregnancy, second trimester: Secondary | ICD-10-CM

## 2023-11-13 DIAGNOSIS — Z3480 Encounter for supervision of other normal pregnancy, unspecified trimester: Secondary | ICD-10-CM

## 2023-11-13 NOTE — Patient Instructions (Signed)
Nilsa Nutting, I greatly value your feedback.  If you receive a survey following your visit with [place] today, we appreciate you taking the time to fill it out.  Thanks, Philipp Deputy, CNM   You will have your sugar test next visit.  Please do not eat or drink anything after midnight the night before you come, not even water.  You will be here for at least two hours.  Please make an appointment online for the bloodwork at [email or web address] for 8:30am (or as close to this as possible). Make sure you select the [facility] service center. The day of the appointment, check in with our office first, then you will go to [facility] to start the sugar test.    [facility] HAS MOVED!!! It is now [facility] at [facility] ([address] [place], [place] [address]) Entrance C, located off of [address] [employer] valet parking  Go to [email or web address] to register for FREE online childbirth classes   Call the office [phone]) or go to [facility] if: You begin to have strong, frequent contractions Your water breaks.  Sometimes it is a big gush of fluid, sometimes it is just a trickle that keeps getting your panties wet or running down your legs You have vaginal bleeding.  It is normal to have a small amount of spotting if your cervix was checked.  You don't feel your baby moving like normal.  If you don't, get you something to eat and drink and lay down and focus on feeling your baby move.   If your baby is still not moving like normal, you should call the office or go to [facility].  [place] Pediatricians/Family Doctors: [facility] [phone]           [facility] [phone]                [facility] Medicine [phone] (usually not accepting new patients unless you have family there already, you are always welcome to call and ask)      [facility] Department [phone]       [facility] Pediatricians/Family Doctors:  [facility]: [phone] [facility]: [phone] [facility]: [phone]  [facility] Doctors:  [name]: [phone]  [facility]: [phone]  [facility] Doctors: [facility]: [phone]   Home Blood Pressure Monitoring for Patients   Your provider has recommended that you check your blood pressure (BP) at least once a week at home. If you do not have a blood pressure cuff at home, one will be provided for you. Contact your provider if you have not received your monitor within 1 week.   Helpful Tips for Accurate Home Blood Pressure Checks  Don't smoke, exercise, or drink caffeine 30 minutes before checking your BP Use the restroom before checking your BP (a full bladder can raise your pressure) Relax in a comfortable upright chair Feet on the ground Left arm resting comfortably on a flat surface at the level of your heart Legs uncrossed Back supported Sit quietly and don't talk Place the cuff on your bare arm Adjust snuggly, so that only two fingertips can fit between your skin and the top of the cuff Check 2 readings separated by at least one minute Keep a log of your BP readings For a visual, please reference this diagram: http://ccnc.care/bpdiagram  Provider Name: [name]     Phone: [phone]  Zone 1: ALL CLEAR  Continue to monitor your symptoms:  BP reading is less than 140 (top number) or less than 90 (bottom number)  No right upper stomach pain No headaches or seeing spots No feeling nauseated or throwing up No swelling in face and hands  Zone 2: CAUTION Call your doctor's office for any of the following:  BP reading is greater than 140 (top number) or greater than 90 (bottom number)  Stomach pain under your ribs in the middle or right side Headaches or seeing spots Feeling nauseated or throwing up Swelling in face and hands  Zone 3: EMERGENCY  Seek  immediate medical care if you have any of the following:  BP reading is greater than160 (top number) or greater than 110 (bottom number) Severe headaches not improving with Tylenol Serious difficulty catching your breath Any worsening symptoms from Zone 2   Second Trimester of Pregnancy The second trimester is from week 13 through week 28, months 4 through 6. The second trimester is often a time when you feel your best. Your body has also adjusted to being pregnant, and you begin to feel better physically. Usually, morning sickness has lessened or quit completely, you may have more energy, and you may have an increase in appetite. The second trimester is also a time when the fetus is growing rapidly. At the end of the sixth month, the fetus is about 9 inches long and weighs about 1 pounds. You will likely begin to feel the baby move (quickening) between 18 and 20 weeks of the pregnancy. BODY CHANGES Your body goes through many changes during pregnancy. The changes vary from woman to woman.  Your weight will continue to increase. You will notice your lower abdomen bulging out. You may begin to get stretch marks on your hips, abdomen, and breasts. You may develop headaches that can be relieved by medicines approved by your health care provider. You may urinate more often because the fetus is pressing on your bladder. You may develop or continue to have heartburn as a result of your pregnancy. You may develop constipation because certain hormones are causing the muscles that push waste through your intestines to slow down. You may develop hemorrhoids or swollen, bulging veins (varicose veins). You may have back pain because of the weight gain and pregnancy hormones relaxing your joints between the bones in your pelvis and as a result of a shift in weight and the muscles that support your balance. Your breasts will continue to grow and be tender. Your gums may bleed and may be sensitive to brushing  and flossing. Dark spots or blotches (chloasma, mask of pregnancy) may develop on your face. This will likely fade after the baby is born. A dark line from your belly button to the pubic area (linea nigra) may appear. This will likely fade after the baby is born. You may have changes in your hair. These can include thickening of your hair, rapid growth, and changes in texture. Some women also have hair loss during or after pregnancy, or hair that feels dry or thin. Your hair will most likely return to normal after your baby is born. WHAT TO EXPECT AT YOUR PRENATAL VISITS During a routine prenatal visit: You will be weighed to make sure you and the fetus are growing normally. Your blood pressure will be taken. Your abdomen will be measured to track your baby's growth. The fetal heartbeat will be listened to. Any test results from the previous visit will be discussed. Your health care provider  may ask you: How you are feeling. If you are feeling the baby move. If you have had any abnormal symptoms, such as leaking fluid, bleeding, severe headaches, or abdominal cramping. If you have any questions. Other tests that may be performed during your second trimester include: Blood tests that check for: Low iron levels (anemia). Gestational diabetes (between 24 and 28 weeks). Rh antibodies. Urine tests to check for infections, diabetes, or protein in the urine. An ultrasound to confirm the proper growth and development of the baby. An amniocentesis to check for possible genetic problems. Fetal screens for spina bifida and Down syndrome. HOME CARE INSTRUCTIONS  Avoid all smoking, herbs, alcohol, and unprescribed drugs. These chemicals affect the formation and growth of the baby. Follow your health care provider's instructions regarding medicine use. There are medicines that are either safe or unsafe to take during pregnancy. Exercise only as directed by your health care provider. Experiencing  uterine cramps is a good sign to stop exercising. Continue to eat regular, healthy meals. Wear a good support bra for breast tenderness. Do not use hot tubs, steam rooms, or saunas. Wear your seat belt at all times when driving. Avoid raw meat, uncooked cheese, cat litter boxes, and soil used by cats. These carry germs that can cause birth defects in the baby. Take your prenatal vitamins. Try taking a stool softener (if your health care provider approves) if you develop constipation. Eat more high-fiber foods, such as fresh vegetables or fruit and whole grains. Drink plenty of fluids to keep your urine clear or pale yellow. Take warm sitz baths to soothe any pain or discomfort caused by hemorrhoids. Use hemorrhoid cream if your health care provider approves. If you develop varicose veins, wear support hose. Elevate your feet for 15 minutes, 3-4 times a day. Limit salt in your diet. Avoid heavy lifting, wear low heel shoes, and practice good posture. Rest with your legs elevated if you have leg cramps or low back pain. Visit your dentist if you have not gone yet during your pregnancy. Use a soft toothbrush to brush your teeth and be gentle when you floss. A sexual relationship may be continued unless your health care provider directs you otherwise. Continue to go to all your prenatal visits as directed by your health care provider. SEEK MEDICAL CARE IF:  You have dizziness. You have mild pelvic cramps, pelvic pressure, or nagging pain in the abdominal area. You have persistent nausea, vomiting, or diarrhea. You have a bad smelling vaginal discharge. You have pain with urination. SEEK IMMEDIATE MEDICAL CARE IF:  You have a fever. You are leaking fluid from your vagina. You have spotting or bleeding from your vagina. You have severe abdominal cramping or pain. You have rapid weight gain or loss. You have shortness of breath with chest pain. You notice sudden or extreme swelling of your face,  hands, ankles, feet, or legs. You have not felt your baby move in over an hour. You have severe headaches that do not go away with medicine. You have vision changes. Document Released: 12/04/2001 Document Revised: 12/15/2013 Document Reviewed: 02/10/2013 [facility] Patient Information 2015 [place], [place]. This information is not intended to replace advice given to you by your health care provider. Make sure you discuss any questions you have with your health care provider.

## 2023-11-13 NOTE — Progress Notes (Signed)
   LOW-RISK PREGNANCY VISIT [name] MRN [id number]  Date of birth: [birth year] Chief Complaint:   Routine Prenatal Visit (discharge)  History of Present Illness:   Morgan [name] is a 27 y.o. G2P0010 female at [redacted]w[redacted]d with an Estimated Date of Delivery: 03/05/24 being seen today for ongoing management of a low-risk pregnancy.  Today she reports  having yellow vag d/c, no irritation . Contractions: Not present.  .  Movement: Present. denies leaking of fluid. Review of Systems:   Pertinent items are noted in HPI Denies abnormal vaginal discharge w/ itching/odor/irritation, headaches, visual changes, shortness of breath, chest pain, abdominal pain, severe nausea/vomiting, or problems with urination or bowel movements unless otherwise stated above. Pertinent History Reviewed:  Reviewed past medical,surgical, social, obstetrical and family history.  Reviewed problem list, medications and allergies. Physical Assessment:   Vitals:   11/13/23 1603  BP: 105/66  Pulse: 88  Weight: 146 lb 9.6 oz (66.5 kg)  Body mass index is 26.81 kg/m.        Physical Examination:   General appearance: Well appearing, and in no distress  Mental status: Alert, oriented to person, place, and time  Skin: Warm & dry  Cardiovascular: Normal heart rate noted  Respiratory: Normal respiratory effort, no distress  Abdomen: Soft, gravid, nontender  Pelvic: Cervical exam deferred         Extremities: Edema: None  Fetal Status: Fetal Heart Rate (bpm): 165   Movement: Present    No results found for this or any previous visit (from the past 24 hour(s)).  Assessment & Plan:  1) Low-risk pregnancy G2P0010 at [redacted]w[redacted]d with an Estimated Date of Delivery: 03/05/24   2) Yellow vag d/c, had neg CV swab 10/23; reviewed signs that we would need to re-test   Meds: No orders of the defined types were placed in this encounter.  Labs/procedures today: none  Plan:  Continue routine obstetrical care    Reviewed: Preterm labor symptoms and general obstetric precautions including but not limited to vaginal bleeding, contractions, leaking of fluid and fetal movement were reviewed in detail with the patient.  All questions were answered. Has home bp cuff. Check bp weekly, let us know if >140/90.   Follow-up: Return in about 4 weeks (around 12/11/2023) for LROB, PN2.  No orders of the defined types were placed in this encounter.  Arabella Merles CNM 11/13/2023 4:29 PM

## 2023-11-17 ENCOUNTER — Encounter: Payer: Self-pay | Admitting: Obstetrics & Gynecology

## 2023-12-13 ENCOUNTER — Ambulatory Visit (INDEPENDENT_AMBULATORY_CARE_PROVIDER_SITE_OTHER): Payer: BC Managed Care – PPO | Admitting: Obstetrics & Gynecology

## 2023-12-13 ENCOUNTER — Other Ambulatory Visit: Payer: BC Managed Care – PPO

## 2023-12-13 ENCOUNTER — Encounter: Payer: Self-pay | Admitting: Obstetrics & Gynecology

## 2023-12-13 VITALS — BP 111/66 | HR 76 | Wt 153.4 lb

## 2023-12-13 DIAGNOSIS — Z3480 Encounter for supervision of other normal pregnancy, unspecified trimester: Secondary | ICD-10-CM | POA: Diagnosis not present

## 2023-12-13 DIAGNOSIS — Z3A27 27 weeks gestation of pregnancy: Secondary | ICD-10-CM

## 2023-12-13 DIAGNOSIS — Z131 Encounter for screening for diabetes mellitus: Secondary | ICD-10-CM | POA: Diagnosis not present

## 2023-12-13 DIAGNOSIS — Z3483 Encounter for supervision of other normal pregnancy, third trimester: Secondary | ICD-10-CM

## 2023-12-13 DIAGNOSIS — Z3A28 28 weeks gestation of pregnancy: Secondary | ICD-10-CM

## 2023-12-13 NOTE — Progress Notes (Signed)
   LOW-RISK PREGNANCY VISIT [name] MRN [id number]  Date of birth: 1996-05-29 Chief Complaint:   Routine Prenatal Visit  History of Present Illness:   Morgan [name] is a 27 y.o. G52P0010 female at [redacted]w[redacted]d with an Estimated Date of Delivery: 03/05/24 being seen today for ongoing management of a low-risk pregnancy.      08/28/2023    3:17 PM 06/25/2023    2:08 PM 03/26/2023    2:07 PM 01/29/2023   11:56 AM 02/04/2019    3:35 PM  Depression screen PHQ 2/9  Decreased Interest 1 2 1 3  0  Down, Depressed, Hopeless 0 0 1 1 0  PHQ - 2 Score 1 2 2 4  0  Altered sleeping 1 2 3 1    Tired, decreased energy 3 2 2 3    Change in appetite 2 2 3 2    Feeling bad or failure about yourself  0 0 0 3   Trouble concentrating 0 2 0 2   Moving slowly or fidgety/restless 0 0 0 1   Suicidal thoughts 0 0 0 0   PHQ-9 Score 7 10 10 16      Today she reports no complaints. Contractions: Not present. Vag. Bleeding: None.  Movement: Present. denies leaking of fluid. Review of Systems:   Pertinent items are noted in HPI Denies abnormal vaginal discharge w/ itching/odor/irritation, headaches, visual changes, shortness of breath, chest pain, abdominal pain, severe nausea/vomiting, or problems with urination or bowel movements unless otherwise stated above. Pertinent History Reviewed:  Reviewed past medical,surgical, social, obstetrical and family history.  Reviewed problem list, medications and allergies.  Physical Assessment:   Vitals:   12/13/23 0911  BP: 111/66  Pulse: 76  Weight: 153 lb 6.4 oz (69.6 kg)  Body mass index is 28.06 kg/m.        Physical Examination:   General appearance: Well appearing, and in no distress  Mental status: Alert, oriented to person, place, and time  Skin: Warm & dry  Respiratory: Normal respiratory effort, no distress  Abdomen: Soft, gravid, nontender  Pelvic: Cervical exam deferred         Extremities: Edema: None  Psych:  mood and affect  appropriate  Fetal Status: Fetal Heart Rate (bpm): 130 Fundal Height: 26 cm Movement: Present    Chaperone: n/a    No results found for this or any previous visit (from the past 24 hours).   Assessment & Plan:  1) Low-risk pregnancy G2P0010 at [redacted]w[redacted]d with an Estimated Date of Delivery: 03/05/24   -PN2 today -Discussed Tdap and given handout []  to review next visit    Meds: No orders of the defined types were placed in this encounter.  Labs/procedures today: PN2  Plan:  Continue routine obstetrical care  Next visit: prefers in person    Reviewed: Preterm labor symptoms and general obstetric precautions including but not limited to vaginal bleeding, contractions, leaking of fluid and fetal movement were reviewed in detail with the patient.  All questions were answered. Pt has home bp cuff. Check bp weekly, let us know if >140/90.   Follow-up: Return in about 2 weeks (around 12/27/2023) for LROB visit.  No orders of the defined types were placed in this encounter.   Myna Hidalgo, DO Attending Obstetrician & Gynecologist, [facility] for [employer], [facility]

## 2023-12-14 LAB — CBC
Hematocrit: 35.7 % (ref 34.0–46.6)
Hemoglobin: 12.2 g/dL (ref 11.1–15.9)
MCH: 33.6 pg — ABNORMAL HIGH (ref 26.6–33.0)
MCHC: 34.2 g/dL (ref 31.5–35.7)
MCV: 98 fL — ABNORMAL HIGH (ref 79–97)
Platelets: 202 10*3/uL (ref 150–450)
RBC: 3.63 x10E6/uL — ABNORMAL LOW (ref 3.77–5.28)
RDW: 11.3 % — ABNORMAL LOW (ref 11.7–15.4)
WBC: 10.6 10*3/uL (ref 3.4–10.8)

## 2023-12-14 LAB — GLUCOSE TOLERANCE, 2 HOURS W/ 1HR
Glucose, 1 hour: 110 mg/dL (ref 70–179)
Glucose, 2 hour: 105 mg/dL (ref 70–152)
Glucose, Fasting: 59 mg/dL — ABNORMAL LOW (ref 70–91)

## 2023-12-14 LAB — HIV ANTIBODY (ROUTINE TESTING W REFLEX): HIV Screen 4th Generation wRfx: NONREACTIVE

## 2023-12-14 LAB — ANTIBODY SCREEN: Antibody Screen: NEGATIVE

## 2023-12-14 LAB — RPR: RPR Ser Ql: NONREACTIVE

## 2023-12-30 ENCOUNTER — Encounter: Payer: Medicaid Other | Admitting: Obstetrics & Gynecology

## 2024-01-03 ENCOUNTER — Encounter: Payer: Self-pay | Admitting: Obstetrics & Gynecology

## 2024-01-07 ENCOUNTER — Ambulatory Visit (INDEPENDENT_AMBULATORY_CARE_PROVIDER_SITE_OTHER): Payer: Medicaid Other | Admitting: Obstetrics & Gynecology

## 2024-01-07 ENCOUNTER — Encounter: Payer: Self-pay | Admitting: Obstetrics & Gynecology

## 2024-01-07 VITALS — BP 102/65 | HR 85 | Wt 160.0 lb

## 2024-01-07 DIAGNOSIS — Z3A31 31 weeks gestation of pregnancy: Secondary | ICD-10-CM

## 2024-01-07 DIAGNOSIS — N760 Acute vaginitis: Secondary | ICD-10-CM

## 2024-01-07 DIAGNOSIS — B9689 Other specified bacterial agents as the cause of diseases classified elsewhere: Secondary | ICD-10-CM

## 2024-01-07 DIAGNOSIS — Z23 Encounter for immunization: Secondary | ICD-10-CM | POA: Diagnosis not present

## 2024-01-07 DIAGNOSIS — Z3483 Encounter for supervision of other normal pregnancy, third trimester: Secondary | ICD-10-CM

## 2024-01-07 MED ORDER — NUVESSA 1.3 % VA GEL: 1.0000 | Freq: Once | VAGINAL | 1 refills | Status: AC

## 2024-01-07 NOTE — Progress Notes (Signed)
   LOW-RISK PREGNANCY VISIT [name] MRN [id number]  Date of birth: February 09, 1996 Chief Complaint:   Routine Prenatal Visit  History of Present Illness:   Morgan [name] is a 28 y.o. G35P0010 female at [redacted]w[redacted]d with an Estimated Date of Delivery: 03/05/24 being seen today for ongoing management of a low-risk pregnancy.     08/28/2023    3:17 PM 06/25/2023    2:08 PM 03/26/2023    2:07 PM 01/29/2023   11:56 AM 02/04/2019    3:35 PM  Depression screen PHQ 2/9  Decreased Interest 1 2 1 3  0  Down, Depressed, Hopeless 0 0 1 1 0  PHQ - 2 Score 1 2 2 4  0  Altered sleeping 1 2 3 1    Tired, decreased energy 3 2 2 3    Change in appetite 2 2 3 2    Feeling bad or failure about yourself  0 0 0 3   Trouble concentrating 0 2 0 2   Moving slowly or fidgety/restless 0 0 0 1   Suicidal thoughts 0 0 0 0   PHQ-9 Score 7 10 10 16      Today she reports no complaints. Contractions: Not present. Vag. Bleeding: None.  Movement: Present. denies leaking of fluid. Review of Systems:   Pertinent items are noted in HPI Denies abnormal vaginal discharge w/ itching/odor/irritation, headaches, visual changes, shortness of breath, chest pain, abdominal pain, severe nausea/vomiting, or problems with urination or bowel movements unless otherwise stated above. Pertinent History Reviewed:  Reviewed past medical,surgical, social, obstetrical and family history.  Reviewed problem list, medications and allergies. Physical Assessment:   Vitals:   01/07/24 1424  BP: 102/65  Pulse: 85  Weight: 160 lb (72.6 kg)  Body mass index is 29.26 kg/m.        Physical Examination:   General appearance: Well appearing, and in no distress  Mental status: Alert, oriented to person, place, and time  Skin: Warm & dry  Cardiovascular: Normal heart rate noted  Respiratory: Normal respiratory effort, no distress  Abdomen: Soft, gravid, nontender  Pelvic: Cervical exam deferred         Extremities:    Fetal Status: Fetal  Heart Rate (bpm): 140 Fundal Height: 31 cm Movement: Present    Chaperone: n/a    No results found for this or any previous visit (from the past 24 hours).  Assessment & Plan:  1) Low-risk pregnancy G2P0010 at [redacted]w[redacted]d with an Estimated Date of Delivery: 03/05/24   2) +BV on wet prep, nuvessa  x 1, repeat in 1w prn   Meds:  Meds ordered this encounter  Medications   NUVESSA  1.3 % GEL    Sig: Place 1 applicator vaginally once for 1 dose.    Dispense:  5 g    Refill:  1   Labs/procedures today:   Plan:  Continue routine obstetrical care  Next visit: prefers in person      Follow-up: Return in about 2 weeks (around 01/21/2024) for LROB.  Orders Placed This Encounter  Procedures   Tdap vaccine greater than or equal to 7yo IM    Luca Burston H Anissia Wessells, MD 01/07/2024 3:15 PM

## 2024-01-13 MED ORDER — METRONIDAZOLE 0.75 % VA GEL
1.0000 | Freq: Every day | VAGINAL | 5 refills | Status: DC
Start: 2024-01-13 — End: 2024-02-12

## 2024-01-21 ENCOUNTER — Ambulatory Visit (INDEPENDENT_AMBULATORY_CARE_PROVIDER_SITE_OTHER): Payer: Medicaid Other | Admitting: Women's Health

## 2024-01-21 ENCOUNTER — Encounter: Payer: Self-pay | Admitting: Women's Health

## 2024-01-21 VITALS — BP 115/71 | HR 100 | Wt 160.0 lb

## 2024-01-21 DIAGNOSIS — Z3A33 33 weeks gestation of pregnancy: Secondary | ICD-10-CM

## 2024-01-21 DIAGNOSIS — Z3483 Encounter for supervision of other normal pregnancy, third trimester: Secondary | ICD-10-CM

## 2024-01-21 DIAGNOSIS — Z3480 Encounter for supervision of other normal pregnancy, unspecified trimester: Secondary | ICD-10-CM

## 2024-01-21 NOTE — Patient Instructions (Signed)
[name] A, thank you for choosing our office today! We appreciate the opportunity to meet your healthcare needs. You may receive a short survey by mail, e-mail, or through [employer]. If you are happy with your care we would appreciate if you could take just a few minutes to complete the survey questions. We read all of your comments and take your feedback very seriously. Thank you again for choosing our office.  Center for [employer] Team at [facility]  [facility] at [facility] ([address] [place], [place] [address]) Entrance C, located off of [address] Free 24/7 valet parking   CLASSES: Go to [email or web address] to register for classes (childbirth, breastfeeding, waterbirth, infant CPR, daddy bootcamp, etc.)  Call the office [phone]) or go to [facility] if: You begin to have strong, frequent contractions Your water breaks.  Sometimes it is a big gush of fluid, sometimes it is just a trickle that keeps getting your panties wet or running down your legs You have vaginal bleeding.  It is normal to have a small amount of spotting if your cervix was checked.  You don't feel your baby moving like normal.  If you don't, get you something to eat and drink and lay down and focus on feeling your baby move.   If your baby is still not moving like normal, you should call the office or go to [facility].  Call the office [phone]) or go to [facility] hospital for these signs of pre-eclampsia: Severe headache that does not go away with Tylenol Visual changes- seeing spots, double, blurred vision Pain under your right breast or upper abdomen that does not go away with Tums or heartburn medicine Nausea and/or vomiting Severe swelling in your hands, feet, and face   Tdap Vaccine It is recommended that you get the Tdap vaccine during the third trimester of EACH pregnancy to help protect your baby from getting pertussis (whooping cough) 27-36 weeks is the BEST time to do  this so that you can pass the protection on to your baby. During pregnancy is better than after pregnancy, but if you are unable to get it during pregnancy it will be offered at the hospital.  You can get this vaccine with [place], at the health department, your family doctor, or some local pharmacies Everyone who will be around your baby should also be up-to-date on their vaccines before the baby comes. Adults (who are not pregnant) only need 1 dose of Tdap during adulthood.   [facility] Pediatricians/Family Doctors  [facility]): [name], [phone]           [facility]: [address], [phone]                [facility]): [address], [phone] (call to ask if accepting patients) [facility] Department: [address], [place], [phone]    [facility] Pediatricians/Family Doctors [facility]): [phone] [address], [phone] [name]: [address] [place], [phone] [facility] [place]: [address], [phone]  [facility] Doctors  [facility] [facility]): [phone] [name]: [address], [phone]   [facility] Doctors [facility]: [address] [phone]  [facility] Family Doctors  [name]: [phone], [phone]  Home Blood Pressure Monitoring for Patients   Your provider has recommended that you check  your blood pressure (BP) at least once a week at home. If you do not have a blood pressure cuff at home, one will be provided for you. Contact your provider if you have not received your monitor within 1 week.   Helpful Tips for Accurate Home Blood Pressure Checks  Don't smoke, exercise, or drink caffeine 30 minutes before checking your BP Use the restroom before checking your BP (a full bladder can raise your  pressure) Relax in a comfortable upright chair Feet on the ground Left arm resting comfortably on a flat surface at the level of your heart Legs uncrossed Back supported Sit quietly and don't talk Place the cuff on your bare arm Adjust snuggly, so that only two fingertips can fit between your skin and the top of the cuff Check 2 readings separated by at least one minute Keep a log of your BP readings For a visual, please reference this diagram: http://ccnc.care/bpdiagram  Provider Name: [name]     Phone: [phone]  Zone 1: ALL CLEAR  Continue to monitor your symptoms:  BP reading is less than 140 (top number) or less than 90 (bottom number)  No right upper stomach pain No headaches or seeing spots No feeling nauseated or throwing up No swelling in face and hands  Zone 2: CAUTION Call your doctor's office for any of the following:  BP reading is greater than 140 (top number) or greater than 90 (bottom number)  Stomach pain under your ribs in the middle or right side Headaches or seeing spots Feeling nauseated or throwing up Swelling in face and hands  Zone 3: EMERGENCY  Seek immediate medical care if you have any of the following:  BP reading is greater than160 (top number) or greater than 110 (bottom number) Severe headaches not improving with Tylenol Serious difficulty catching your breath Any worsening symptoms from Zone 2  Preterm Labor and Birth Information  The normal length of a pregnancy is 39-41 weeks. Preterm labor is when labor starts before 37 completed weeks of pregnancy. What are the risk factors for preterm labor? Preterm labor is more likely to occur in women who: Have certain infections during pregnancy such as a bladder infection, sexually transmitted infection, or infection inside the uterus (chorioamnionitis). Have a shorter-than-normal cervix. Have gone into preterm labor before. Have had surgery on their cervix. Are younger than age 9  or older than age 31. Are African American. Are pregnant with twins or multiple babies (multiple gestation). Take street drugs or smoke while pregnant. Do not gain enough weight while pregnant. Became pregnant shortly after having been pregnant. What are the symptoms of preterm labor? Symptoms of preterm labor include: Cramps similar to those that can happen during a menstrual period. The cramps may happen with diarrhea. Pain in the abdomen or lower back. Regular uterine contractions that may feel like tightening of the abdomen. A feeling of increased pressure in the pelvis. Increased watery or bloody mucus discharge from the vagina. Water breaking (ruptured amniotic sac). Why is it important to recognize signs of preterm labor? It is important to recognize signs of preterm labor because babies who are born prematurely may not be fully developed. This can put them at an increased risk for: Long-term (chronic) heart and lung problems. Difficulty immediately after birth with regulating body systems, including blood sugar, body temperature, heart rate, and breathing rate. Bleeding in the brain. Cerebral palsy. Learning difficulties. Death. These risks are highest for babies who are born before 88  weeks of pregnancy. How is preterm labor treated? Treatment depends on the length of your pregnancy, your condition, and the health of your baby. It may involve: Having a stitch (suture) placed in your cervix to prevent your cervix from opening too early (cerclage). Taking or being given medicines, such as: Hormone medicines. These may be given early in pregnancy to help support the pregnancy. Medicine to stop contractions. Medicines to help mature the baby's lungs. These may be prescribed if the risk of delivery is high. Medicines to prevent your baby from developing cerebral palsy. If the labor happens before 34 weeks of pregnancy, you may need to stay in the hospital. What should I do if I  think I am in preterm labor? If you think that you are going into preterm labor, call your health care provider right away. How can I prevent preterm labor in future pregnancies? To increase your chance of having a full-term pregnancy: Do not use any tobacco products, such as cigarettes, chewing tobacco, and e-cigarettes. If you need help quitting, ask your health care provider. Do not use street drugs or medicines that have not been prescribed to you during your pregnancy. Talk with your health care provider before taking any herbal supplements, even if you have been taking them regularly. Make sure you gain a healthy amount of weight during your pregnancy. Watch for infection. If you think that you might have an infection, get it checked right away. Make sure to tell your health care provider if you have gone into preterm labor before. This information is not intended to replace advice given to you by your health care provider. Make sure you discuss any questions you have with your health care provider. Document Revised: 04/03/2019 Document Reviewed: 05/02/2016 Elsevier Patient Education  2020 [employer].   Considering [place]? Guide for patients at Center for [employer] [facility]) Why consider waterbirth? Gentle birth for babies  Less pain medicine used in labor  May allow for passive descent/less pushing  May reduce perineal tears  More mobility and instinctive maternal position changes  Increased maternal relaxation   Is waterbirth safe? What are the risks of infection, drowning or other complications? Infection:  Very low risk (3.7 % for tub vs 4.8% for bed)  7 in 8000 waterbirths with documented infection  Poorly cleaned equipment most common cause  Slightly lower group B strep transmission rate  Drowning  Maternal:  Very low risk  Related to seizures or fainting  Newborn:  Very low risk. No evidence of increased risk of respiratory problems in multiple large  studies  Physiological protection from breathing under water  Avoid underwater birth if there are any fetal complications  Once baby's head is out of the water, keep it out.  Birth complication  Some reports of cord trauma, but risk decreased by bringing baby to surface gradually  No evidence of increased risk of shoulder dystocia. Mothers can usually change positions faster in water than in a bed, possibly aiding the maneuvers to free the shoulder.   There are 2 things you MUST do to have a waterbirth with [facility]: Attend a waterbirth class at [employer] [facility] at [facility]   3rd Wednesday of every month from 7-9 pm (virtual during COVID) [employer] at [email or web address] or [email or web address] or by calling [phone] Bring [place] the certificate from the class to your prenatal appointment or send via MyChart Meet with a midwife at 36 weeks* to see if you can still plan a waterbirth  and to sign the consent.   *We also recommend that you schedule as many of your prenatal visits with a midwife as possible.    Helpful information: You may want to bring a bathing suit top to the hospital to wear during labor but this is optional.  All other supplies are provided by the hospital. Please arrive at the hospital with signs of active labor, and do not wait at home until late in labor. It takes 45 min- 1 hour for fetal monitoring, and check in to your room to take place, plus transport and filling of the waterbirth tub.    Things that would prevent you from having a waterbirth: Premature, <37wks  Previous cesarean birth  Presence of thick meconium-stained fluid  Multiple gestation (Twins, triplets, etc.)  Uncontrolled diabetes or gestational diabetes requiring medication  Hypertension diagnosed in pregnancy or preexisting hypertension (gestational hypertension, preeclampsia, or chronic hypertension) Fetal growth restriction (your baby measures less than 10th percentile  on ultrasound) Heavy vaginal bleeding  Non-reassuring fetal heart rate  Active infection (MRSA, etc.). Group B Strep is NOT a contraindication for waterbirth.  If your labor has to be induced and induction method requires continuous monitoring of the baby's heart rate  Other risks/issues identified by your obstetrical provider   Please remember that birth is unpredictable. Under certain unforeseeable circumstances your provider may advise against giving birth in the tub. These decisions will be made on a case-by-case basis and with the safety of you and your baby as our highest priority.    Updated 03/28/22

## 2024-01-21 NOTE — Progress Notes (Signed)
LOW-RISK PREGNANCY VISIT [name] MRN [id number]  Date of birth: 02-12-1996 Chief Complaint:   Routine Prenatal Visit  History of Present Illness:   Morgan [name] is a 28 y.o. G58P0010 female at [redacted]w[redacted]d with an Estimated Date of Delivery: 03/05/24 being seen today for ongoing management of a low-risk pregnancy.   Today she reports  just finished metrogel last night, some white d/c, no itching/odor/irritation . 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 Contractions: Irregular.  .  Movement: Present. denies leaking of fluid.     08/28/2023    3:17 PM 06/25/2023    2:08 PM 03/26/2023    2:07 PM 01/29/2023   11:56 AM 02/04/2019    3:35 PM  Depression screen PHQ 2/9  Decreased Interest 1 2 1 3  0  Down, Depressed, Hopeless 0 0 1 1 0  PHQ - 2 Score 1 2 2 4  0  Altered sleeping 1 2 3 1    Tired, decreased energy 3 2 2 3    Change in appetite 2 2 3 2    Feeling bad or failure about yourself  0 0 0 3   Trouble concentrating 0 2 0 2   Moving slowly or fidgety/restless 0 0 0 1   Suicidal thoughts 0 0 0 0   PHQ-9 Score 7 10 10 16          08/28/2023    3:18 PM 06/25/2023    2:09 PM 03/26/2023    2:08 PM 01/29/2023   11:57 AM  GAD 7 : Generalized Anxiety Score  Nervous, Anxious, on Edge 1 2 0 1  Control/stop worrying 1 2 0 1  Worry too much - different things 1 2 1 1   Trouble relaxing 1 2 2 2   Restless 2 2 2 1   Easily annoyed or irritable 3 2 0 1  Afraid - awful might happen 0 0 0 0  Total GAD 7 Score 9 12 5 7       Review of Systems:   Pertinent items are noted in HPI Denies abnormal vaginal discharge w/ itching/odor/irritation, headaches, visual changes, shortness of breath, chest pain, abdominal pain, severe nausea/vomiting, or problems with urination or bowel movements unless otherwise stated above. Pertinent History Reviewed:  Reviewed past medical,surgical, social, obstetrical and family history.  Reviewed problem list, medications and  allergies. Physical Assessment:   Vitals:   01/21/24 1425  BP: 115/71  Pulse: 100  Weight: 160 lb (72.6 kg)  Body mass index is 29.26 kg/m.        Physical Examination:   General appearance: Well appearing, and in no distress  Mental status: Alert, oriented to person, place, and time  Skin: Warm & dry  Cardiovascular: Normal heart rate noted  Respiratory: Normal respiratory effort, no distress  Abdomen: Soft, gravid, nontender  Pelvic: Cervical exam deferred         Extremities: Edema: None  Fetal Status: Fetal Heart Rate (bpm): 143 Fundal Height: 31 cm Movement: Present    Chaperone: N/A   No results found for this or any previous visit (from the past 24 hours).  Assessment & Plan:  1) Low-risk pregnancy G2P0010 at [redacted]w[redacted]d with an Estimated Date of Delivery: 03/05/24   2) Plans waterbirth> class 2/4, to send certificate via MyChart, gave printed info, understands providers at hospital during admission will determine eligibility   Meds: No orders of the defined types were placed in this encounter.  Labs/procedures today: Tdap and declines RSV vaccine  today, maybe next visit  Plan:  Continue routine obstetrical care  Next visit: prefers in person    Reviewed: Preterm labor symptoms and general obstetric precautions including but not limited to vaginal bleeding, contractions, leaking of fluid and fetal movement were reviewed in detail with the patient.  All questions were answered. Does have home bp cuff. Office bp cuff given: not applicable. Check bp weekly, let us know if consistently >140 and/or >90.  Follow-up: Return in about 2 weeks (around 02/04/2024) for LROB, CNM, in person; then weekly therafter w/ CNM.  No future appointments.  No orders of the defined types were placed in this encounter.  Cheral Marker CNM, [facility] 01/21/2024 2:50 PM

## 2024-01-27 DIAGNOSIS — Z3493 Encounter for supervision of normal pregnancy, unspecified, third trimester: Secondary | ICD-10-CM | POA: Diagnosis not present

## 2024-01-27 DIAGNOSIS — J101 Influenza due to other identified influenza virus with other respiratory manifestations: Secondary | ICD-10-CM | POA: Diagnosis not present

## 2024-01-27 DIAGNOSIS — R519 Headache, unspecified: Secondary | ICD-10-CM | POA: Diagnosis not present

## 2024-01-27 DIAGNOSIS — Z20822 Contact with and (suspected) exposure to covid-19: Secondary | ICD-10-CM | POA: Diagnosis not present

## 2024-02-01 ENCOUNTER — Encounter: Payer: Self-pay | Admitting: Obstetrics & Gynecology

## 2024-02-02 ENCOUNTER — Ambulatory Visit: Payer: Self-pay

## 2024-02-05 ENCOUNTER — Encounter: Payer: Medicaid Other | Admitting: Women's Health

## 2024-02-06 ENCOUNTER — Ambulatory Visit (INDEPENDENT_AMBULATORY_CARE_PROVIDER_SITE_OTHER): Payer: Medicaid Other | Admitting: Advanced Practice Midwife

## 2024-02-06 ENCOUNTER — Other Ambulatory Visit (HOSPITAL_COMMUNITY)
Admission: RE | Admit: 2024-02-06 | Discharge: 2024-02-06 | Disposition: A | Discharge status: Home / Self Care | Payer: BC Managed Care – PPO | Source: Ambulatory Visit | Attending: Women's Health | Admitting: Women's Health

## 2024-02-06 ENCOUNTER — Encounter: Payer: Self-pay | Admitting: Advanced Practice Midwife

## 2024-02-06 VITALS — BP 122/74 | HR 104 | Wt 163.0 lb

## 2024-02-06 DIAGNOSIS — Z3A36 36 weeks gestation of pregnancy: Secondary | ICD-10-CM | POA: Diagnosis not present

## 2024-02-06 DIAGNOSIS — Z3480 Encounter for supervision of other normal pregnancy, unspecified trimester: Secondary | ICD-10-CM | POA: Diagnosis not present

## 2024-02-06 DIAGNOSIS — Z3483 Encounter for supervision of other normal pregnancy, third trimester: Secondary | ICD-10-CM | POA: Diagnosis not present

## 2024-02-06 NOTE — Progress Notes (Signed)
   LOW-RISK PREGNANCY VISIT Patient name: JOSELIN CRANDELL MRN [id number]  Date of birth: [birth year] Chief Complaint:   Routine Prenatal Visit  History of Present Illness:   SHEMECA LUKASIK is a 28 y.o. G30P0010 female at [redacted]w[redacted]d with an Estimated Date of Delivery: 03/05/24 being seen today for ongoing management of a low-risk pregnancy.  Today she reports normal pregnancy complaints. Contractions: Not present. Vag. Bleeding: None.  Movement: Present. denies leaking of fluid. Review of Systems:   Pertinent items are noted in HPI Denies abnormal vaginal discharge w/ itching/odor/irritation, headaches, visual changes, shortness of breath, chest pain, abdominal pain, severe nausea/vomiting, or problems with urination or bowel movements unless otherwise stated above. Pertinent History Reviewed:  Reviewed past medical,surgical, social, obstetrical and family history.  Reviewed problem list, medications and allergies. Physical Assessment:   Vitals:   02/06/24 1512  BP: 122/74  Pulse: (!) 104  Weight: 163 lb (73.9 kg)  Body mass index is 29.81 kg/m.        Physical Examination:   General appearance: Well appearing, and in no distress  Mental status: Alert, oriented to person, place, and time  Skin: Warm & dry  Cardiovascular: Normal heart rate noted  Respiratory: Normal respiratory effort, no distress  Abdomen: Soft, gravid, nontender  Pelvic: Cervical exam performed  Dilation: 1.5 Effacement (%): 20 Station: -2  Extremities:   Chaperone:  Engineer, site   Fetal Status: Fetal Heart Rate (bpm): 140 Fundal Height: 35 cm Movement: Present Presentation: Vertex    No results found for this or any previous visit (from the past 24 hours).  Assessment & Plan:    Pregnancy: G2P0010 at [redacted]w[redacted]d 1. [redacted] weeks gestation of pregnancy (Primary)  - Cervicovaginal ancillary only( [place]) - Culture, beta strep (group b only)  2. Supervision of other normal pregnancy, antepartum  - Cervicovaginal  ancillary only( [place]) - Culture, beta strep (group b only)     Meds: No orders of the defined types were placed in this encounter.  Labs/procedures today: see above  Plan:  Continue routine obstetrical care  Next visit: prefers in person    Reviewed: Term labor symptoms and general obstetric precautions including but not limited to vaginal bleeding, contractions, leaking of fluid and fetal movement were reviewed in detail with the patient.  All questions were answered. Has home bp cuff.. Check bp weekly, let us know if >140/90.   Follow-up: No follow-ups on file.  Future Appointments  Date Time Provider Department Center  02/12/2024 11:50 AM Cheral Marker, CNM [facility]  02/19/2024 11:50 AM Cheral Marker, CNM [facility]  02/26/2024 11:50 AM Cheral Marker, CNM [facility]  03/04/2024 11:30 AM Cheral Marker, CNM [facility]    Orders Placed This Encounter  Procedures   Culture, beta strep (group b only)   Jacklyn Shell DNP, CNM 02/06/2024 3:36 PM

## 2024-02-10 LAB — CERVICOVAGINAL ANCILLARY ONLY
Chlamydia: NEGATIVE
Comment: NEGATIVE
Comment: NORMAL
Neisseria Gonorrhea: NEGATIVE

## 2024-02-10 LAB — CULTURE, BETA STREP (GROUP B ONLY): Strep Gp B Culture: NEGATIVE

## 2024-02-12 ENCOUNTER — Ambulatory Visit (INDEPENDENT_AMBULATORY_CARE_PROVIDER_SITE_OTHER): Payer: Medicaid Other | Admitting: Women's Health

## 2024-02-12 ENCOUNTER — Encounter: Payer: Self-pay | Admitting: Women's Health

## 2024-02-12 VITALS — BP 109/67 | HR 98 | Wt 163.6 lb

## 2024-02-12 DIAGNOSIS — Z3483 Encounter for supervision of other normal pregnancy, third trimester: Secondary | ICD-10-CM

## 2024-02-12 DIAGNOSIS — Z3A36 36 weeks gestation of pregnancy: Secondary | ICD-10-CM | POA: Diagnosis not present

## 2024-02-12 NOTE — Patient Instructions (Signed)
Morgan Morgan [name] Morgan [name], thank you for choosing our office today! We appreciate the opportunity to meet your healthcare needs. You may receive Morgan [name] short survey by mail, e-mail, or through [employer]. If you are happy with your care we would appreciate if you could take just Morgan [name] to complete the survey questions. We read all of your comments and take your feedback very seriously. Thank you again for choosing our office.  Center for [employer] Team at [facility]  [facility] at [facility] ([address] [place], [place] [address]) Entrance C, located off of [address] Free 24/7 valet parking   CLASSES: Go to [email or web address] to register for classes (childbirth, breastfeeding, waterbirth, infant CPR, daddy bootcamp, etc.)  Call the office [phone]) or go to [facility] if: You begin to have strong, frequent contractions Your water breaks.  Sometimes it is Morgan [name], sometimes it is just Morgan [name] that keeps getting your panties wet or running down your legs You have vaginal bleeding.  It is normal to have Morgan [name] if your cervix was checked.  You don't feel your baby moving like normal.  If you don't, get you something to eat and drink and lay down and focus on feeling your baby move.   If your baby is still not moving like normal, you should call the office or go to [facility].  Call the office [phone]) or go to [facility] hospital for these signs of pre-eclampsia: Severe headache that does not go away with Tylenol Visual changes- seeing spots, double, blurred vision Pain under your right breast or upper abdomen that does not go away with Tums or heartburn medicine Nausea and/or vomiting Severe swelling in your hands, feet, and face   [facility] Pediatricians/Family Doctors [name]): [name], [phone]           [name]: [address] Morgan [name], [phone]                 [name]): [address], [phone] (call to ask if accepting patients) [facility] Department: [address], [place], [phone]    [name] Pediatricians/Family Doctors [name]): [address], [phone] [name]: [address] [place], [phone] [name]: [address], [phone]  [name] Doctors  [name]): [phone] [name]: [address], [phone]   [name] Doctors Morgan Morgan [name] [facility]: [address] [phone]  [name] Doctors  [name]: [phone], [phone]  Home Blood Pressure Monitoring for Patients   Your provider has recommended that you check your blood pressure (BP) at least once Morgan [name] week at home. If you do not have Morgan [name] blood pressure cuff at home, one will be provided for you. Contact your provider if you have not received your monitor within 1 week.   Helpful Tips for Accurate Home Blood Pressure Checks  Don't smoke, exercise, or drink caffeine 30 minutes before checking your BP Use the restroom before checking your BP (Morgan [name]) Relax in Morgan [name] Feet on the ground Left arm resting comfortably on Morgan [name] at the level of your heart Legs uncrossed Back supported Sit quietly and don't talk Place the cuff on your bare arm Adjust snuggly, so that only two  fingertips can fit between your skin and the top of the cuff Check 2 readings separated by at least one minute Keep Morgan [name] For Morgan [name] visual, please reference this diagram: [email or web address]/bpdiagram  Provider Name: [name]     Phone: [phone]  Zone 1: ALL CLEAR  Continue to monitor your symptoms:  BP reading is less than 140 (top number) or less than 90 (bottom number)  No right  upper stomach pain No headaches or seeing spots No feeling nauseated or throwing up No swelling in face and hands  Zone 2: CAUTION Call your doctor's office for any of the following:  BP reading is greater than 140 (top number) or greater than 90 (bottom number)  Stomach pain under your ribs in the middle or right side Headaches or seeing spots Feeling nauseated or throwing up Swelling in face and hands  Zone 3: EMERGENCY  Seek immediate medical care if you have any of the following:  BP reading is greater than160 (top number) or greater than 110 (bottom number) Severe headaches not improving with Tylenol Serious difficulty catching your breath Any worsening symptoms from Zone 2   Braxton Hicks Contractions Contractions of the uterus can occur throughout pregnancy, but they are not always Morgan [name] sign that you are in labor. You may have practice contractions called Braxton Hicks contractions. These false labor contractions are sometimes confused with true labor. What are Deberah Pelton contractions? Braxton Hicks contractions are tightening movements that occur in the muscles of the uterus before labor. Unlike true labor contractions, these contractions do not result in opening (dilation) and thinning of the cervix. Toward the end of pregnancy (32-34 weeks), Braxton Hicks contractions can happen more often and may become stronger. These contractions are sometimes difficult to tell apart from true labor because they can be very uncomfortable. You should not feel embarrassed if you go to the hospital with false labor. Sometimes, the only way to tell if you are in true labor is for your health care provider to look for changes in the cervix. The health care provider will do Morgan [name] physical exam and may monitor your contractions. If you are not in true labor, the exam should show that your cervix is not dilating and your water has not broken. If there are no other health problems associated with your  pregnancy, it is completely safe for you to be sent home with false labor. You may continue to have Braxton Hicks contractions until you go into true labor. How to tell the difference between true labor and false labor True labor Contractions last 30-70 seconds. Contractions become very regular. Discomfort is usually felt in the top of the uterus, and it spreads to the lower abdomen and low back. Contractions do not go away with walking. Contractions usually become more intense and increase in frequency. The cervix dilates and gets thinner. False labor Contractions are usually shorter and not as strong as true labor contractions. Contractions are usually irregular. Contractions are often felt in the front of the lower abdomen and in the groin. Contractions may go away when you walk around or change positions while lying down. Contractions get weaker and are shorter-lasting as time goes on. The cervix usually does not dilate or become thin. Follow these instructions at home:  Take over-the-counter and prescription medicines only as told by your health care provider. Keep up with your usual exercises and follow other instructions from your health care provider. Eat and drink lightly if you  think you are going into labor. If Braxton Hicks contractions are making you uncomfortable: Change your position from lying down or resting to walking, or change from walking to resting. Sit and rest in Morgan [name] tub of warm water. Drink enough fluid to keep your urine pale yellow. Dehydration may cause these contractions. Do slow and deep breathing several times an hour. Keep all follow-up prenatal visits as told by your health care provider. This is important. Contact Morgan [name] health care provider if: You have Morgan [name] fever. You have continuous pain in your abdomen. Get help right away if: Your contractions become stronger, more regular, and closer together. You have fluid leaking or gushing from your vagina. You pass  blood-tinged mucus (bloody show). You have bleeding from your vagina. You have low back pain that you never had before. You feel your baby's head pushing down and causing pelvic pressure. Your baby is not moving inside you as much as it used to. Summary Contractions that occur before labor are called Braxton Hicks contractions, false labor, or practice contractions. Braxton Hicks contractions are usually shorter, weaker, farther apart, and less regular than true labor contractions. True labor contractions usually become progressively stronger and regular, and they become more frequent. Manage discomfort from [facility] contractions by changing position, resting in Morgan [name] warm bath, drinking plenty of water, or practicing deep breathing. This information is not intended to replace advice given to you by your health care provider. Make sure you discuss any questions you have with your health care provider. Document Revised: 11/22/2017 Document Reviewed: 04/25/2017 Elsevier Patient Education  2020 [employer].

## 2024-02-12 NOTE — Progress Notes (Signed)
LOW-RISK PREGNANCY VISIT [name] MRN [id number]  Date of birth: 1996/07/13 Chief Complaint:   Routine Prenatal Visit  History of Present Illness:   Morgan [name] is a 28 y.o. G74P0010 female at [redacted]w[redacted]d with an Estimated Date of Delivery: 03/05/24 being seen today for ongoing management of a low-risk pregnancy.   Today she reports occasional contractions. Woke up twice w/ bed wet, was dreaming she was having baby. No leaking during day at all-discussed likely was pee, if ROM would continue.  Contractions: Irregular. Vag. Bleeding: None.  Movement: Present.      08/28/2023    3:17 PM 06/25/2023    2:08 PM 03/26/2023    2:07 PM 01/29/2023   11:56 AM 02/04/2019    3:35 PM  Depression screen PHQ 2/9  Decreased Interest 1 2 1 3  0  Down, Depressed, Hopeless 0 0 1 1 0  PHQ - 2 Score 1 2 2 4  0  Altered sleeping 1 2 3 1    Tired, decreased energy 3 2 2 3    Change in appetite 2 2 3 2    Feeling bad or failure about yourself  0 0 0 3   Trouble concentrating 0 2 0 2   Moving slowly or fidgety/restless 0 0 0 1   Suicidal thoughts 0 0 0 0   PHQ-9 Score 7 10 10 16          08/28/2023    3:18 PM 06/25/2023    2:09 PM 03/26/2023    2:08 PM 01/29/2023   11:57 AM  GAD 7 : Generalized Anxiety Score  Nervous, Anxious, on Edge 1 2 0 1  Control/stop worrying 1 2 0 1  Worry too much - different things 1 2 1 1   Trouble relaxing 1 2 2 2   Restless 2 2 2 1   Easily annoyed or irritable 3 2 0 1  Afraid - awful might happen 0 0 0 0  Total GAD 7 Score 9 12 5 7       Review of Systems:   Pertinent items are noted in HPI Denies abnormal vaginal discharge w/ itching/odor/irritation, headaches, visual changes, shortness of breath, chest pain, abdominal pain, severe nausea/vomiting, or problems with urination or bowel movements unless otherwise stated above. Pertinent History Reviewed:  Reviewed past medical,surgical, social, obstetrical and family history.  Reviewed problem list, medications and  allergies. Physical Assessment:   Vitals:   02/12/24 0854  BP: 109/67  Pulse: 98  Weight: 163 lb 9.6 oz (74.2 kg)  Body mass index is 29.92 kg/m.        Physical Examination:   General appearance: Well appearing, and in no distress  Mental status: Alert, oriented to person, place, and time  Skin: Warm & dry  Cardiovascular: Normal heart rate noted  Respiratory: Normal respiratory effort, no distress  Abdomen: Soft, gravid, nontender  Pelvic: Cervical exam deferred         Extremities: Edema: None  Fetal Status: Fetal Heart Rate (bpm): 144 Fundal Height: 35 cm Movement: Present    Chaperone: N/A   No results found for this or any previous visit (from the past 24 hours).  Assessment & Plan:  1) Low-risk pregnancy G2P0010 at [redacted]w[redacted]d with an Estimated Date of Delivery: 03/05/24   2) Plans waterbirth> went to class and sent certificate, understands providers at hospital during admission will determine eligibility   Meds: No orders of the defined types were placed in this encounter.  Labs/procedures today: none  Plan:  Continue routine obstetrical care  Next visit: prefers in person    Reviewed: Term labor symptoms and general obstetric precautions including but not limited to vaginal bleeding, contractions, leaking of fluid and fetal movement were reviewed in detail with the patient.  All questions were answered. Does have home bp cuff. Office bp cuff given: not applicable. Check bp weekly, let us know if consistently >140 and/or >90.  Follow-up: Return for As scheduled.  Future Appointments  Date Time Provider Department Center  02/19/2024 11:50 AM Cheral Marker, CNM [facility]  02/26/2024 11:50 AM Cheral Marker, CNM [facility]  03/04/2024 11:30 AM Cheral Marker, CNM [facility]    No orders of the defined types were placed in this encounter.  Cheral Marker CNM, [facility] 02/12/2024 9:10 AM

## 2024-02-19 ENCOUNTER — Encounter: Payer: Self-pay | Admitting: Women's Health

## 2024-02-19 ENCOUNTER — Ambulatory Visit (INDEPENDENT_AMBULATORY_CARE_PROVIDER_SITE_OTHER): Payer: Medicaid Other | Admitting: Women's Health

## 2024-02-19 VITALS — BP 106/75 | HR 90 | Wt 168.0 lb

## 2024-02-19 DIAGNOSIS — Z3480 Encounter for supervision of other normal pregnancy, unspecified trimester: Secondary | ICD-10-CM

## 2024-02-19 DIAGNOSIS — Z3483 Encounter for supervision of other normal pregnancy, third trimester: Secondary | ICD-10-CM

## 2024-02-19 DIAGNOSIS — Z3A37 37 weeks gestation of pregnancy: Secondary | ICD-10-CM

## 2024-02-19 NOTE — Progress Notes (Signed)
 LOW-RISK PREGNANCY VISIT [name] MRN [id number]  Date of birth: 11/01/1996 Chief Complaint:   Routine Prenatal Visit  History of Present Illness:   Morgan [name] is a 28 y.o. G15P0010 female at [redacted]w[redacted]d with an Estimated Date of Delivery: 03/05/24 being seen today for ongoing management of a low-risk pregnancy.   Today she reports irregular contractions. Contractions: Irritability. Vag. Bleeding: None.  Movement: Present. denies leaking of fluid.     08/28/2023    3:17 PM 06/25/2023    2:08 PM 03/26/2023    2:07 PM 01/29/2023   11:56 AM 02/04/2019    3:35 PM  Depression screen PHQ 2/9  Decreased Interest 1 2 1 3  0  Down, Depressed, Hopeless 0 0 1 1 0  PHQ - 2 Score 1 2 2 4  0  Altered sleeping 1 2 3 1    Tired, decreased energy 3 2 2 3    Change in appetite 2 2 3 2    Feeling bad or failure about yourself  0 0 0 3   Trouble concentrating 0 2 0 2   Moving slowly or fidgety/restless 0 0 0 1   Suicidal thoughts 0 0 0 0   PHQ-9 Score 7 10 10 16          08/28/2023    3:18 PM 06/25/2023    2:09 PM 03/26/2023    2:08 PM 01/29/2023   11:57 AM  GAD 7 : Generalized Anxiety Score  Nervous, Anxious, on Edge 1 2 0 1  Control/stop worrying 1 2 0 1  Worry too much - different things 1 2 1 1   Trouble relaxing 1 2 2 2   Restless 2 2 2 1   Easily annoyed or irritable 3 2 0 1  Afraid - awful might happen 0 0 0 0  Total GAD 7 Score 9 12 5 7       Review of Systems:   Pertinent items are noted in HPI Denies abnormal vaginal discharge w/ itching/odor/irritation, headaches, visual changes, shortness of breath, chest pain, abdominal pain, severe nausea/vomiting, or problems with urination or bowel movements unless otherwise stated above. Pertinent History Reviewed:  Reviewed past medical,surgical, social, obstetrical and family history.  Reviewed problem list, medications and allergies. Physical Assessment:   Vitals:   02/19/24 1205  BP: 106/75  Pulse: 90  Weight: 168 lb (76.2 kg)   Body mass index is 30.73 kg/m.        Physical Examination:   General appearance: Well appearing, and in no distress  Mental status: Alert, oriented to person, place, and time  Skin: Warm & dry  Cardiovascular: Normal heart rate noted  Respiratory: Normal respiratory effort, no distress  Abdomen: Soft, gravid, nontender  Pelvic: Cervical exam deferred         Extremities: Edema: None  Fetal Status: Fetal Heart Rate (bpm): 135 Fundal Height: 37 cm Movement: Present    Chaperone: N/A   No results found for this or any previous visit (from the past 24 hours).  Assessment & Plan:  1) Low-risk pregnancy G2P0010 at [redacted]w[redacted]d with an Estimated Date of Delivery: 03/05/24   2) Plans waterbirth> went to class and sent certificate, understands providers at hospital during admission will determine eligibility    Meds: No orders of the defined types were placed in this encounter.  Labs/procedures today: none  Plan:  Continue routine obstetrical care  Next visit: prefers in person    Reviewed: Term labor symptoms and general obstetric precautions including but  not limited to vaginal bleeding, contractions, leaking of fluid and fetal movement were reviewed in detail with the patient.  All questions were answered. Does have home bp cuff. Office bp cuff given: not applicable. Check bp weekly, let us know if consistently >140 and/or >90.  Follow-up: Return for As scheduled.  Future Appointments  Date Time Provider Department Center  02/26/2024 11:50 AM Cheral Marker, CNM [facility]  03/04/2024 11:30 AM Cheral Marker, CNM [facility]    No orders of the defined types were placed in this encounter.  Cheral Marker CNM, [facility] 02/19/2024 12:22 PM

## 2024-02-19 NOTE — Patient Instructions (Addendum)
 [name] A, thank you for choosing our office today! We appreciate the opportunity to meet your healthcare needs. You may receive a short survey by mail, e-mail, or through [employer]. If you are happy with your care we would appreciate if you could take just a few minutes to complete the survey questions. We read all of your comments and take your feedback very seriously. Thank you again for choosing our office.  Center for [facility] Healthcare Team at [facility]  [facility] at [facility] ([address] [place], [place] [address]) Entrance C, located off of [facility] Free 24/7 valet parking   Primary Care Providers Dr. Dwana Melena [phone] [facility] Primary Care [phone] [facility] [phone] [facility] Medicine [place]) [phone] [facility] [place]) [phone] [facility] [place]) [phone] [facility] [phone] [facility] [phone]   CLASSES: Go to [email or web address] to register for classes (childbirth, breastfeeding, waterbirth, infant CPR, daddy bootcamp, etc.)  Call the office [phone]) or go to [facility] if: You begin to have strong, frequent contractions Your water breaks.  Sometimes it is a big gush of fluid, sometimes it is just a trickle that keeps getting your panties wet or running down your legs You have vaginal bleeding.  It is normal to have a small amount of spotting if your cervix was checked.  You don't feel your baby moving like normal.  If you don't, get you something to eat and drink and lay down and focus on feeling your baby move.   If your baby is still not moving like normal, you should call the office or go to [facility].  Call the office [phone]) or go to [facility] hospital for these signs of pre-eclampsia: Severe headache that does not go away with Tylenol Visual changes- seeing spots, double, blurred vision Pain under your right  breast or upper abdomen that does not go away with Tums or heartburn medicine Nausea and/or vomiting Severe swelling in your hands, feet, and face   [facility] Pediatricians/Family Doctors [facility]): [name], [phone]           [name]: [address], [phone]                [facility] Family Medicine [facility]): [address], [phone] (call to ask if accepting patients) [facility] Department: [address], [place], [phone]    [facility] Pediatricians/Family Doctors [facility]): [address], [phone] [name]: [address] [place], [phone] [facility]: [address], [phone]  [facility] Doctors  [facility]): [phone] [name]: [address] [phone]   [facility] Doctors [facility]: [address] [phone]  [facility] Doctors  [name]: [phone], [phone]  Home Blood Pressure Monitoring for Patients   Your provider has recommended that you check your blood pressure (BP) at least once a week at home. If you do not have a blood pressure cuff at home, one will be provided for you. Contact your provider if you have not received your monitor within 1 week.   Helpful Tips for Accurate Home Blood Pressure Checks  Don't smoke, exercise, or drink caffeine 30 minutes before checking your BP Use the restroom before checking your BP (a full bladder can raise your pressure) Relax in a comfortable upright  chair Feet on the ground Left arm resting comfortably on a flat surface at the level of your heart Legs uncrossed Back supported Sit quietly and don't talk Place the cuff on your bare arm Adjust snuggly, so that only two fingertips can fit between your skin and the top of the  cuff Check 2 readings separated by at least one minute Keep a log of your BP readings For a visual, please reference this diagram: http://ccnc.care/bpdiagram  Provider Name: [name]     Phone: [phone]  Zone 1: ALL CLEAR  Continue to monitor your symptoms:  BP reading is less than 140 (top number) or less than 90 (bottom number)  No right upper stomach pain No headaches or seeing spots No feeling nauseated or throwing up No swelling in face and hands  Zone 2: CAUTION Call your doctor's office for any of the following:  BP reading is greater than 140 (top number) or greater than 90 (bottom number)  Stomach pain under your ribs in the middle or right side Headaches or seeing spots Feeling nauseated or throwing up Swelling in face and hands  Zone 3: EMERGENCY  Seek immediate medical care if you have any of the following:  BP reading is greater than160 (top number) or greater than 110 (bottom number) Severe headaches not improving with Tylenol Serious difficulty catching your breath Any worsening symptoms from Zone 2   Braxton Hicks Contractions Contractions of the uterus can occur throughout pregnancy, but they are not always a sign that you are in labor. You may have practice contractions called Braxton Hicks contractions. These false labor contractions are sometimes confused with true labor. What are Deberah Pelton contractions? Braxton Hicks contractions are tightening movements that occur in the muscles of the uterus before labor. Unlike true labor contractions, these contractions do not result in opening (dilation) and thinning of the cervix. Toward the end of pregnancy (32-34 weeks), Braxton Hicks contractions can happen more often and may become stronger. These contractions are sometimes difficult to tell apart from true labor because they can be very uncomfortable. You should not feel embarrassed if you go to the hospital with false labor. Sometimes, the only way  to tell if you are in true labor is for your health care provider to look for changes in the cervix. The health care provider will do a physical exam and may monitor your contractions. If you are not in true labor, the exam should show that your cervix is not dilating and your water has not broken. If there are no other health problems associated with your pregnancy, it is completely safe for you to be sent home with false labor. You may continue to have Braxton Hicks contractions until you go into true labor. How to tell the difference between true labor and false labor True labor Contractions last 30-70 seconds. Contractions become very regular. Discomfort is usually felt in the top of the uterus, and it spreads to the lower abdomen and low back. Contractions do not go away with walking. Contractions usually become more intense and increase in frequency. The cervix dilates and gets thinner. False labor Contractions are usually shorter and not as strong as true labor contractions. Contractions are usually irregular. Contractions are often felt in the front of the lower abdomen and in the groin. Contractions may go away when you walk around or change positions while lying down. Contractions get weaker and are shorter-lasting as time goes on. The cervix usually does not dilate or become  thin. Follow these instructions at home:  Take over-the-counter and prescription medicines only as told by your health care provider. Keep up with your usual exercises and follow other instructions from your health care provider. Eat and drink lightly if you think you are going into labor. If Braxton Hicks contractions are making you uncomfortable: Change your position from lying down or resting to walking, or change from walking to resting. Sit and rest in a tub of warm water. Drink enough fluid to keep your urine pale yellow. Dehydration may cause these contractions. Do slow and deep breathing several times  an hour. Keep all follow-up prenatal visits as told by your health care provider. This is important. Contact a health care provider if: You have a fever. You have continuous pain in your abdomen. Get help right away if: Your contractions become stronger, more regular, and closer together. You have fluid leaking or gushing from your vagina. You pass blood-tinged mucus (bloody show). You have bleeding from your vagina. You have low back pain that you never had before. You feel your baby's head pushing down and causing pelvic pressure. Your baby is not moving inside you as much as it used to. Summary Contractions that occur before labor are called Braxton Hicks contractions, false labor, or practice contractions. Braxton Hicks contractions are usually shorter, weaker, farther apart, and less regular than true labor contractions. True labor contractions usually become progressively stronger and regular, and they become more frequent. Manage discomfort from [facility] contractions by changing position, resting in a warm bath, drinking plenty of water, or practicing deep breathing. This information is not intended to replace advice given to you by your health care provider. Make sure you discuss any questions you have with your health care provider. Document Revised: 11/22/2017 Document Reviewed: 04/25/2017 Elsevier Patient Education  2020 [employer].

## 2024-02-26 ENCOUNTER — Encounter: Payer: Self-pay | Admitting: Obstetrics & Gynecology

## 2024-02-26 ENCOUNTER — Encounter: Payer: Self-pay | Admitting: Women's Health

## 2024-02-26 ENCOUNTER — Inpatient Hospital Stay (HOSPITAL_COMMUNITY)
Admission: AD | Admit: 2024-02-26 | Discharge: 2024-02-27 | Disposition: A | Discharge status: Home / Self Care | Attending: Obstetrics and Gynecology | Admitting: Obstetrics and Gynecology

## 2024-02-26 ENCOUNTER — Encounter (HOSPITAL_COMMUNITY): Payer: Self-pay | Admitting: Obstetrics and Gynecology

## 2024-02-26 ENCOUNTER — Ambulatory Visit (INDEPENDENT_AMBULATORY_CARE_PROVIDER_SITE_OTHER): Payer: Medicaid Other | Admitting: Women's Health

## 2024-02-26 VITALS — BP 110/73 | HR 89 | Wt 167.0 lb

## 2024-02-26 DIAGNOSIS — Z3483 Encounter for supervision of other normal pregnancy, third trimester: Secondary | ICD-10-CM

## 2024-02-26 DIAGNOSIS — O26893 Other specified pregnancy related conditions, third trimester: Secondary | ICD-10-CM

## 2024-02-26 DIAGNOSIS — N898 Other specified noninflammatory disorders of vagina: Secondary | ICD-10-CM

## 2024-02-26 DIAGNOSIS — Z3A38 38 weeks gestation of pregnancy: Secondary | ICD-10-CM

## 2024-02-26 DIAGNOSIS — Z0371 Encounter for suspected problem with amniotic cavity and membrane ruled out: Secondary | ICD-10-CM | POA: Insufficient documentation

## 2024-02-26 DIAGNOSIS — Z3A39 39 weeks gestation of pregnancy: Secondary | ICD-10-CM | POA: Insufficient documentation

## 2024-02-26 LAB — POCT FERN TEST: POCT Fern Test: NEGATIVE

## 2024-02-26 NOTE — Patient Instructions (Signed)
 Morgan Morgan [name], thank you for choosing our office today! We appreciate the opportunity to meet your healthcare needs. You may receive Morgan [name] short survey by mail, e-mail, or through [employer]. If you are happy with your care we would appreciate if you could take just Morgan [name] to complete the survey questions. We read all of your comments and take your feedback very seriously. Thank you again for choosing our office.  Center for [employer] Team at [facility]  [facility] at [facility] ([address] [place], [place] [address]) Entrance C, located off of [address] Free 24/7 valet parking   CLASSES: Go to [email or web address] to register for classes (childbirth, breastfeeding, waterbirth, infant CPR, daddy bootcamp, etc.)  Call the office [phone]) or go to [facility] if: You begin to have strong, frequent contractions Your water breaks.  Sometimes it is Morgan [name], sometimes it is just Morgan [name] that keeps getting your panties wet or running down your legs You have vaginal bleeding.  It is normal to have Morgan [name] if your cervix was checked.  You don't feel your baby moving like normal.  If you don't, get you something to eat and drink and lay down and focus on feeling your baby move.   If your baby is still not moving like normal, you should call the office or go to [facility].  Call the office [phone]) or go to [facility] hospital for these signs of pre-eclampsia: Severe headache that does not go away with Tylenol Visual changes- seeing spots, double, blurred vision Pain under your right breast or upper abdomen that does not go away with Tums or heartburn medicine Nausea and/or vomiting Severe swelling in your hands, feet, and face   [facility] Pediatricians/Family Doctors [name]): [address] Colette [name], [phone]           [name]: [address] Morgan [name], [phone]                 [name] Family Medicine [name]): [address], [phone] (call to ask if accepting patients) [facility] Department: [address], [place], [phone]    [name] Pediatricians/Family Doctors [name]): [address], [phone] [name]: [address] [place], [phone] [name]: [address], [phone]  [name] Doctors  [name]): [phone] [name]: [address], [phone]   [name] Doctors [facility]: [address] [phone]  [name] Doctors  [name]: [phone], [phone]  Home Blood Pressure Monitoring for Patients   Your provider has recommended that you check your blood pressure (BP) at least once Morgan [name] week at home. If you do not have Morgan [name] blood pressure cuff at home, one will be provided for you. Contact your provider if you have not received your monitor within 1 week.   Helpful Tips for Accurate Home Blood Pressure Checks  Don't smoke, exercise, or drink caffeine 30 minutes before checking your BP Use the restroom before checking your BP (Morgan [name]) Relax in Morgan [name] Feet on the ground Left arm resting comfortably on Morgan [name] at the level of your heart Legs uncrossed Back supported Sit quietly and don't talk Place the cuff on your bare arm Adjust snuggly, so that only two  fingertips can fit between your skin and the top of the cuff Check 2 readings separated by at least one minute Keep Morgan [name] For Morgan [name] visual, please reference this diagram: http://ccnc.care/bpdiagram  Provider Name: [name]     Phone: [phone]  Zone 1: ALL CLEAR  Continue to monitor your symptoms:  BP reading is less than 140 (top number) or less than 90 (bottom number)  No right  upper stomach pain No headaches or seeing spots No feeling nauseated or throwing up No swelling in face and hands  Zone 2: CAUTION Call your doctor's office for any of the following:  BP reading is greater than 140 (top number) or greater than 90 (bottom number)  Stomach pain under your ribs in the middle or right side Headaches or seeing spots Feeling nauseated or throwing up Swelling in face and hands  Zone 3: EMERGENCY  Seek immediate medical care if you have any of the following:  BP reading is greater than160 (top number) or greater than 110 (bottom number) Severe headaches not improving with Tylenol Serious difficulty catching your breath Any worsening symptoms from Zone 2   Braxton Hicks Contractions Contractions of the uterus can occur throughout pregnancy, but they are not always Morgan [name] sign that you are in labor. You may have practice contractions called Braxton Hicks contractions. These false labor contractions are sometimes confused with true labor. What are Morgan Morgan [name] contractions? Braxton Hicks contractions are tightening movements that occur in the muscles of the uterus before labor. Unlike true labor contractions, these contractions do not result in opening (dilation) and thinning of the cervix. Toward the end of pregnancy (32-34 weeks), Braxton Hicks contractions can happen more often and may become stronger. These contractions are sometimes difficult to tell apart from true labor because they can be very uncomfortable. You should not feel embarrassed if you go to the hospital with false labor. Sometimes, the only way to tell if you are in true labor is for your health care provider to look for changes in the cervix. The health care provider will do Morgan [name] physical exam and may monitor your contractions. If you are not in true labor, the exam should show that your cervix is not dilating and your water has not broken. If there are no other health problems associated with your  pregnancy, it is completely safe for you to be sent home with false labor. You may continue to have Braxton Hicks contractions until you go into true labor. How to tell the difference between true labor and false labor True labor Contractions last 30-70 seconds. Contractions become very regular. Discomfort is usually felt in the top of the uterus, and it spreads to the lower abdomen and low back. Contractions do not go away with walking. Contractions usually become more intense and increase in frequency. The cervix dilates and gets thinner. False labor Contractions are usually shorter and not as strong as true labor contractions. Contractions are usually irregular. Contractions are often felt in the front of the lower abdomen and in the groin. Contractions may go away when you walk around or change positions while lying down. Contractions get weaker and are shorter-lasting as time goes on. The cervix usually does not dilate or become thin. Follow these instructions at home:  Take over-the-counter and prescription medicines only as told by your health care provider. Keep up with your usual exercises and follow other instructions from your health care provider. Eat and drink lightly if you  think you are going into labor. If Braxton Hicks contractions are making you uncomfortable: Change your position from lying down or resting to walking, or change from walking to resting. Sit and rest in Morgan [name] tub of warm water. Drink enough fluid to keep your urine pale yellow. Dehydration may cause these contractions. Do slow and deep breathing several times an hour. Keep all follow-up prenatal visits as told by your health care provider. This is important. Contact Morgan [name] health care provider if: You have Morgan [name] fever. You have continuous pain in your abdomen. Get help right away if: Your contractions become stronger, more regular, and closer together. You have fluid leaking or gushing from your vagina. You pass  blood-tinged mucus (bloody show). You have bleeding from your vagina. You have low back pain that you never had before. You feel your baby's head pushing down and causing pelvic pressure. Your baby is not moving inside you as much as it used to. Summary Contractions that occur before labor are called Braxton Hicks contractions, false labor, or practice contractions. Braxton Hicks contractions are usually shorter, weaker, farther apart, and less regular than true labor contractions. True labor contractions usually become progressively stronger and regular, and they become more frequent. Manage discomfort from [facility] contractions by changing position, resting in Morgan [name] warm bath, drinking plenty of water, or practicing deep breathing. This information is not intended to replace advice given to you by your health care provider. Make sure you discuss any questions you have with your health care provider. Document Revised: 11/22/2017 Document Reviewed: 04/25/2017 Elsevier Patient Education  2020 [employer].

## 2024-02-26 NOTE — Progress Notes (Signed)
 LOW-RISK PREGNANCY VISIT [name] MRN [id number]  Date of birth: 12/06/1996 Chief Complaint:   Routine Prenatal Visit  History of Present Illness:   Morgan [name] is a 28 y.o. G74P0010 female at [redacted]w[redacted]d with an Estimated Date of Delivery: 03/05/24 being seen today for ongoing management of a low-risk pregnancy.   Today she reports 3 contractions back to back this am, then stopped. Contractions: Irregular. Vag. Bleeding: None.  Movement: Present. denies leaking of fluid.     08/28/2023    3:17 PM 06/25/2023    2:08 PM 03/26/2023    2:07 PM 01/29/2023   11:56 AM 02/04/2019    3:35 PM  Depression screen PHQ 2/9  Decreased Interest 1 2 1 3  0  Down, Depressed, Hopeless 0 0 1 1 0  PHQ - 2 Score 1 2 2 4  0  Altered sleeping 1 2 3 1    Tired, decreased energy 3 2 2 3    Change in appetite 2 2 3 2    Feeling bad or failure about yourself  0 0 0 3   Trouble concentrating 0 2 0 2   Moving slowly or fidgety/restless 0 0 0 1   Suicidal thoughts 0 0 0 0   PHQ-9 Score 7 10 10 16          08/28/2023    3:18 PM 06/25/2023    2:09 PM 03/26/2023    2:08 PM 01/29/2023   11:57 AM  GAD 7 : Generalized Anxiety Score  Nervous, Anxious, on Edge 1 2 0 1  Control/stop worrying 1 2 0 1  Worry too much - different things 1 2 1 1   Trouble relaxing 1 2 2 2   Restless 2 2 2 1   Easily annoyed or irritable 3 2 0 1  Afraid - awful might happen 0 0 0 0  Total GAD 7 Score 9 12 5 7       Review of Systems:   Pertinent items are noted in HPI Denies abnormal vaginal discharge w/ itching/odor/irritation, headaches, visual changes, shortness of breath, chest pain, abdominal pain, severe nausea/vomiting, or problems with urination or bowel movements unless otherwise stated above. Pertinent History Reviewed:  Reviewed past medical,surgical, social, obstetrical and family history.  Reviewed problem list, medications and allergies. Physical Assessment:   Vitals:   02/26/24 1206  BP: 110/73  Pulse: 89   Weight: 167 lb (75.8 kg)  Body mass index is 30.54 kg/m.        Physical Examination:   General appearance: Well appearing, and in no distress  Mental status: Alert, oriented to person, place, and time  Skin: Warm & dry  Cardiovascular: Normal heart rate noted  Respiratory: Normal respiratory effort, no distress  Abdomen: Soft, gravid, nontender  Pelvic: Cervical exam performed  Dilation: 1.5 Effacement (%): 80 Station: -2  Extremities: Edema: Trace  Fetal Status: Fetal Heart Rate (bpm): 142 Fundal Height: 35 cm Movement: Present Presentation: Vertex  Chaperone: N/A No results found for this or any previous visit (from the past 24 hours).  Assessment & Plan:  1) Low-risk pregnancy G2P0010 at [redacted]w[redacted]d with an Estimated Date of Delivery: 03/05/24   2) Size <dates, measured 37 last week, peeing more and breathing easier so likely d/t baby dropping some- will monitor  3) Plans waterbirth> went to class and sent certificate, understands providers at hospital during admission will determine eligibility    Meds: No orders of the defined types were placed in this encounter.  Labs/procedures today:  SVE  Plan:  Continue routine obstetrical care  Next visit: prefers in person    Reviewed: Term labor symptoms and general obstetric precautions including but not limited to vaginal bleeding, contractions, leaking of fluid and fetal movement were reviewed in detail with the patient.  All questions were answered. Does have home bp cuff. Office bp cuff given: not applicable. Check bp weekly, let us know if consistently >140 and/or >90.  Follow-up: Return for As scheduled.  Future Appointments  Date Time Provider Department Center  03/04/2024 11:30 AM Cheral Marker, CNM [facility]    No orders of the defined types were placed in this encounter.  Cheral Marker CNM, [facility] 02/26/2024 12:17 PM

## 2024-02-26 NOTE — MAU Note (Signed)
.  Morgan [name] is a 28 y.o. at [redacted]w[redacted]d here in MAU reporting: leaking of fluid since 5pm  Contractions every: 5+ minutes Onset of ctx: today Pain score: 8/10  ROM: clear gush of fluid at 5pm and continues to leak now Vaginal Bleeding: no Last SVE: 2cm Labor Pain Management Plan: Planning Waterbirth  Fetal Movement: Reports positive FM FHT:  145  Vitals:   02/26/24 2134  BP: 109/65  Pulse: 88  Resp: 19  Temp: (!) 97.4 F (36.3 C)  SpO2: 100%     OB Office: Faculty GBS: Negative Lab orders placed from triage: MAU Labor Eval

## 2024-02-26 NOTE — MAU Provider Note (Signed)
  History     CSN: [id number]  Arrival date and time: 02/26/24 2048   None     Chief Complaint  Patient presents with  . Rupture of Membranes  . Contractions    5   HPI  {GYN/OB [id number]}  Past Medical History:  Diagnosis Date  . Anxiety   . Chlamydia 01/28/2020   Treated 01/28/20, POC___________  . Depression   . Dizziness and giddiness   . GERD (gastroesophageal reflux disease)   . Miscarriage     Past Surgical History:  Procedure Laterality Date  . ROOT CANAL  03/11/2017    Family History  Problem Relation Age of Onset  . Diabetes Father   . Diabetes Paternal Aunt   . Cancer Paternal Aunt   . Alzheimer's disease Paternal Aunt   . Diabetes Maternal Grandmother   . Cancer Paternal Grandmother   . Stroke Paternal Grandmother   . Heart attack Paternal Grandmother   . Diabetes Paternal Grandmother   . Diabetes Other     Social History   Tobacco Use  . Smoking status: Former    Types: Cigarettes, Cigars  . Smokeless tobacco: Never  Vaping Use  . Vaping status: Former  Substance Use Topics  . Alcohol use: Not Currently    Comment: occ  . Drug use: Not Currently    Types: Marijuana    Allergies: No Known Allergies  Medications Prior to Admission  Medication Sig Dispense Refill Last Dose/Taking  . Prenatal Vit-Fe Fumarate-FA (PRENATAL VITAMIN) 27-0.8 MG TABS Take 1 tablet by mouth daily. 30 tablet 12 02/26/2024 at 11:25 PM  . Blood Pressure Monitor MISC For regular home bp monitoring during pregnancy 1 each 0     Review of Systems Physical Exam   Blood pressure 109/65, pulse 88, temperature (!) 97.4 F (36.3 C), temperature source Oral, resp. rate 19, height 5\' 2"  (1.575 m), weight 75.8 kg, last menstrual period 05/30/2023, SpO2 100%.  Physical Exam Constitutional:      Appearance: Normal appearance.  HENT:     Head: Normocephalic.     Mouth/Throat:     Mouth: Mucous membranes are dry.  Cardiovascular:     Rate and Rhythm: Normal rate.   Pulmonary:     Effort: Pulmonary effort is normal.  Abdominal:     Comments: NST: baseline 140s, +  Genitourinary:    General: Normal vulva.  Musculoskeletal:        General: Normal range of motion.     Cervical back: Normal range of motion.  Skin:    General: Skin is warm and dry.  Neurological:     Mental Status: She is alert.  Psychiatric:        Mood and Affect: Mood normal.    MAU Course  Procedures  MDM ***  Assessment and Plan  ***  Morgan [name] 02/26/2024, 11:56 PM

## 2024-02-27 DIAGNOSIS — O26893 Other specified pregnancy related conditions, third trimester: Secondary | ICD-10-CM | POA: Diagnosis not present

## 2024-02-27 DIAGNOSIS — N898 Other specified noninflammatory disorders of vagina: Secondary | ICD-10-CM | POA: Diagnosis not present

## 2024-02-27 DIAGNOSIS — Z3A39 39 weeks gestation of pregnancy: Secondary | ICD-10-CM | POA: Diagnosis not present

## 2024-02-27 DIAGNOSIS — Z0371 Encounter for suspected problem with amniotic cavity and membrane ruled out: Secondary | ICD-10-CM | POA: Diagnosis present

## 2024-02-27 DIAGNOSIS — Z3A38 38 weeks gestation of pregnancy: Secondary | ICD-10-CM | POA: Diagnosis not present

## 2024-02-27 NOTE — MAU Provider Note (Incomplete)
  History     CSN: [id number]  Arrival date and time: 02/26/24 2048   None     Chief Complaint  Patient presents with  . Rupture of Membranes  . Contractions    5   HPI  {GYN/OB [id number]}  Past Medical History:  Diagnosis Date  . Anxiety   . Chlamydia 01/28/2020   Treated 01/28/20, POC___________  . Depression   . Dizziness and giddiness   . GERD (gastroesophageal reflux disease)   . Miscarriage     Past Surgical History:  Procedure Laterality Date  . ROOT CANAL  03/11/2017    Family History  Problem Relation Age of Onset  . Diabetes Father   . Diabetes Paternal Aunt   . Cancer Paternal Aunt   . Alzheimer's disease Paternal Aunt   . Diabetes Maternal Grandmother   . Cancer Paternal Grandmother   . Stroke Paternal Grandmother   . Heart attack Paternal Grandmother   . Diabetes Paternal Grandmother   . Diabetes Other     Social History   Tobacco Use  . Smoking status: Former    Types: Cigarettes, Cigars  . Smokeless tobacco: Never  Vaping Use  . Vaping status: Former  Substance Use Topics  . Alcohol use: Not Currently    Comment: occ  . Drug use: Not Currently    Types: Marijuana    Allergies: No Known Allergies  Medications Prior to Admission  Medication Sig Dispense Refill Last Dose/Taking  . Prenatal Vit-Fe Fumarate-FA (PRENATAL VITAMIN) 27-0.8 MG TABS Take 1 tablet by mouth daily. 30 tablet 12 02/26/2024 at 11:25 PM  . Blood Pressure Monitor MISC For regular home bp monitoring during pregnancy 1 each 0     Review of Systems Physical Exam   Blood pressure 109/65, pulse 88, temperature (!) 97.4 F (36.3 C), temperature source Oral, resp. rate 19, height 5\' 2"  (1.575 m), weight 75.8 kg, last menstrual period 05/30/2023, SpO2 100%.  Physical Exam Constitutional:      Appearance: Normal appearance.  HENT:     Head: Normocephalic.     Mouth/Throat:     Mouth: Mucous membranes are dry.  Cardiovascular:     Rate and Rhythm: Normal rate.   Pulmonary:     Effort: Pulmonary effort is normal.  Abdominal:     Comments: NST: baseline 140s, +accels, no decels, Cat 1 Ctx irreg  Genitourinary:    General: Normal vulva.     Comments: SSE: neg pool/neg fern Cx 1+/70/vtx -2 Musculoskeletal:        General: Normal range of motion.     Cervical back: Normal range of motion.  Skin:    General: Skin is warm and dry.  Neurological:     Mental Status: She is alert.  Psychiatric:        Mood and Affect: Mood normal.     MAU Course  Procedures  MDM ***  Assessment and Plan  ***  Morgan [name] 02/26/2024, 11:56 PM

## 2024-03-02 DIAGNOSIS — Z3490 Encounter for supervision of normal pregnancy, unspecified, unspecified trimester: Secondary | ICD-10-CM | POA: Diagnosis not present

## 2024-03-02 DIAGNOSIS — O99344 Other mental disorders complicating childbirth: Secondary | ICD-10-CM | POA: Diagnosis not present

## 2024-03-02 DIAGNOSIS — F32A Depression, unspecified: Secondary | ICD-10-CM | POA: Diagnosis not present

## 2024-03-02 DIAGNOSIS — Z3A39 39 weeks gestation of pregnancy: Secondary | ICD-10-CM | POA: Diagnosis not present

## 2024-03-02 DIAGNOSIS — F419 Anxiety disorder, unspecified: Secondary | ICD-10-CM | POA: Diagnosis not present

## 2024-03-03 DIAGNOSIS — Z3A39 39 weeks gestation of pregnancy: Secondary | ICD-10-CM | POA: Diagnosis not present

## 2024-03-03 DIAGNOSIS — O368130 Decreased fetal movements, third trimester, not applicable or unspecified: Secondary | ICD-10-CM | POA: Diagnosis not present

## 2024-03-04 ENCOUNTER — Encounter: Payer: Medicaid Other | Admitting: Women's Health

## 2024-04-08 ENCOUNTER — Encounter: Payer: Self-pay | Admitting: *Deleted

## 2024-04-08 ENCOUNTER — Encounter: Payer: Self-pay | Admitting: Women's Health

## 2024-04-08 ENCOUNTER — Ambulatory Visit: Admitting: Women's Health

## 2024-04-08 DIAGNOSIS — F32A Depression, unspecified: Secondary | ICD-10-CM | POA: Diagnosis not present

## 2024-04-08 DIAGNOSIS — F419 Anxiety disorder, unspecified: Secondary | ICD-10-CM | POA: Diagnosis not present

## 2024-04-08 DIAGNOSIS — F53 Postpartum depression: Secondary | ICD-10-CM | POA: Diagnosis not present

## 2024-04-08 DIAGNOSIS — K5900 Constipation, unspecified: Secondary | ICD-10-CM

## 2024-04-08 DIAGNOSIS — Z8659 Personal history of other mental and behavioral disorders: Secondary | ICD-10-CM | POA: Insufficient documentation

## 2024-04-08 MED ORDER — PHEXXI 1.8-1-0.4 % VA GEL: 1.0000 | Freq: Once | VAGINAL | 11 refills | Status: AC

## 2024-04-08 NOTE — Patient Instructions (Signed)
Tips To Increase Milk Supply Lots of water! Enough so that your urine is clear Plenty of calories, if you're not getting enough calories, your milk supply can decrease Breastfeed/pump often, every 2-3 hours x 20-68mins Fenugreek 3 pills 3 times a day, this may make your urine smell like maple syrup Mother's Milk Tea Lactation cookies, google for the recipe Real oatmeal Body Armor sports drinks Liquid Gold Greater Than hydration drink   Constipation Drink plenty of fluid, preferably water, throughout the day Eat foods high in fiber such as fruits, vegetables, and grains Exercise, such as walking, is a good way to keep your bowels regular Drink warm fluids, especially warm prune juice, or decaf coffee Eat a 1/2 cup of real oatmeal (not instant), 1/2 cup applesauce, and 1/2-1 cup warm prune juice every day If needed, you may take Colace (docusate sodium) stool softener once or twice a day to help keep the stool soft. If you are pregnant, wait until you are out of your first trimester (12-14 weeks of pregnancy) If you still are having problems with constipation, you may take Miralax once daily as needed to help keep your bowels regular.  If you are pregnant, wait until you are out of your first trimester (12-14 weeks of pregnancy)

## 2024-04-08 NOTE — Progress Notes (Signed)
 POSTPARTUM VISIT [name] MRN [id number]  Date of birth: 12/05/1996 Chief Complaint:   Postpartum Care (Will use condoms; don't want birth control; had rectal bleeding yesterday with BM)  History of Present Illness:   Morgan [name] is a 28 y.o. G76P1011 female being seen today for a postpartum visit. She is 5 weeks postpartum following a spontaneous vaginal delivery at 39.5 gestational weeks. IOL: yes, for latent labor/DFM at [facility] (was at work). Anesthesia: epidural.  Laceration: 1st degree.  Complications: none. Inpatient contraception: no.   Pregnancy uncomplicated. Tobacco use: no. Substance use disorder: no. Last pap smear: 12/27/21 and results were NILM w/ HRHPV not done. Next pap smear due: 2026 Patient's last menstrual period was 05/30/2023.  Postpartum course has been uncomplicated. Bleeding spotting. Bowel function is constipation- had small amt BRB w/ BM yesterday. Bladder function is normal. Urinary incontinence? no, fecal incontinence? no Patient is not sexually active. Last sexual activity: prior to birth of baby. Desired contraception: condoms, offered Phexxi-would like to try. Patient does want a pregnancy in the future.  Desired family size is 2 children.   The pregnancy intention screening data noted above was reviewed. Potential methods of contraception were discussed. The patient elected to proceed with No data recorded.  Edinburgh Postpartum Depression Screening: negative, does report h/o dep/anx- on celexa in past. Some dep/anx now, partner helps a lot. Denies SI/HI/II. Some decreased appetite. Declines meds. Would like therapy.   Edinburgh Postnatal Depression Scale - 04/08/24 1046       Edinburgh Postnatal Depression Scale:  In the Past 7 Days   I have been able to laugh and see the funny side of things. 0    I have looked forward with enjoyment to things. 1    I have blamed myself unnecessarily when things went wrong. 2    I have been anxious or  worried for no good reason. 0    I have felt scared or panicky for no good reason. 0    Things have been getting on top of me. 2    I have been so unhappy that I have had difficulty sleeping. 0    I have felt sad or miserable. 0    I have been so unhappy that I have been crying. 0    The thought of harming myself has occurred to me. 0    Edinburgh Postnatal Depression Scale Total 5                08/28/2023    3:18 PM 06/25/2023    2:09 PM 03/26/2023    2:08 PM 01/29/2023   11:57 AM  GAD 7 : Generalized Anxiety Score  Nervous, Anxious, on Edge 1 2 0 1  Control/stop worrying 1 2 0 1  Worry too much - different things 1 2 1 1   Trouble relaxing 1 2 2 2   Restless 2 2 2 1   Easily annoyed or irritable 3 2 0 1  Afraid - awful might happen 0 0 0 0  Total GAD 7 Score 9 12 5 7      Baby's course has been uncomplicated. Baby is feeding by breast and bottle: milk supply inadequate . Infant has a pediatrician/family doctor? Yes.  Childcare strategy if returning to work/school:  did not discuss .  Pt has material needs met for her and baby: Yes.   Review of Systems:   Pertinent items are noted in HPI Denies Abnormal vaginal discharge w/ itching/odor/irritation, headaches, visual  changes, shortness of breath, chest pain, abdominal pain, severe nausea/vomiting, or problems with urination or bowel movements. Pertinent History Reviewed:  Reviewed past medical,surgical, obstetrical and family history.  Reviewed problem list, medications and allergies. OB History  Gravida Para Term Preterm AB Living  2 1 1  0 1 1  SAB IAB Ectopic Multiple Live Births  1 0 0 0 1    # Outcome Date GA Lbr Len/2nd Weight Sex Type Anes PTL Lv  2 Term 03/03/24 [redacted]w[redacted]d  7 lb 1.8 oz (3.226 kg) M Vag-Spont   LIV  1 SAB 03/02/23           Physical Assessment:   Vitals:   04/08/24 1049  BP: 115/71  Pulse: 88  Weight: 158 lb 8 oz (71.9 kg)  Height: 5' 1.5" (1.562 m)  Body mass index is 29.46 kg/m.       Physical  Examination:   General appearance: alert, well appearing, and in no distress  Mental status: alert, oriented to person, place, and time  Skin: warm & dry   Cardiovascular: normal heart rate noted   Respiratory: normal respiratory effort, no distress   Breasts: deferred, no complaints   Abdomen: soft, non-tender   Pelvic: lac healing well. Thin prep pap obtained: No  Extremities: Edema: none   Chaperone: Malachy Mood       No results found for this or any previous visit (from the past 24 hours).  Assessment & Plan:  1) Postpartum exam 2) 5 wks s/p spontaneous vaginal delivery after IOL for latent labor/DFM 3) breast & bottle feeding> milk tips given 4) Depression screening 5) Contraception management: rx Phexxi to [facility], discussed use, plans condoms as well 6) Mild ppd/anxiety> declines meds, IBH referral ordered, declined f/u in 4wks- will let us know if needs anything 7) Constipation/likely internal hemorrhoids> gave printed info, stay unconstipated, can use otc hemorrhoids meds Essential components of care per ACOG recommendations:  1.  Mood and well being:  If positive depression screen, discussed and plan developed.  If using tobacco we discussed reduction/cessation and risk of relapse If current substance abuse, we discussed and referral to local resources was offered.   2. Infant care and feeding:  If breastfeeding, discussed returning to work, pumping, breastfeeding-associated pain, guidance regarding return to fertility while lactating if not using another method. If needed, patient was provided with a letter to be allowed to pump q 2-3hrs to support lactation in a private location with access to a refrigerator to store breastmilk.   Recommended that all caregivers be immunized for flu, pertussis and other preventable communicable diseases If pt does not have material needs met for her/baby, referred to local resources for help obtaining these.  3. Sexuality,  contraception and birth spacing Provided guidance regarding sexuality, management of dyspareunia, and resumption of intercourse Discussed avoiding interpregnancy interval <47mths and recommended birth spacing of 18 months  4. Sleep and fatigue Discussed coping options for fatigue and sleep disruption Encouraged family/partner/community support of 4 hrs of uninterrupted sleep to help with mood and fatigue  5. Physical recovery  If pt had a C/S, assessed incisional pain and providing guidance on normal vs prolonged recovery If pt had a laceration, perineal healing and pain reviewed.  If urinary or fecal incontinence, discussed management and referred to PT or uro/gyn if indicated  Patient is safe to resume physical activity. Discussed attainment of healthy weight.  6.  Chronic disease management Discussed pregnancy complications if any, and their implications for future childbearing  and long-term maternal health. Review recommendations for prevention of recurrent pregnancy complications, such as 17 hydroxyprogesterone caproate to reduce risk for recurrent PTB not applicable, or aspirin to reduce risk of preeclampsia not applicable. Pt had GDM: no. If yes, 2hr GTT scheduled: not applicable. Reviewed medications and non-pregnant dosing including consideration of whether pt is breastfeeding using a reliable resource such as LactMed: yes Referred for f/u w/ PCP or subspecialist providers as indicated: not applicable  7. Health maintenance Mammogram at 27yo or earlier if indicated Pap smears as indicated  Meds:  Meds ordered this encounter  Medications   Lactic Ac-Citric Ac-Pot Bitart (PHEXXI) 1.8-1-0.4 % GEL    Sig: Place 1 Applicatorful vaginally once for 1 dose. Up to 1 hour before sex    Dispense:  180 g    Refill:  11    Follow-up: Return in about 1 year (around 04/08/2025) for Pap & physical.   Orders Placed This Encounter  Procedures   Amb ref to [facility]     Ferd Householder CNM, [facility] 04/08/2024 11:19 AM

## 2024-04-21 ENCOUNTER — Ambulatory Visit: Admitting: Women's Health

## 2024-05-08 NOTE — BH Specialist Note (Signed)
 Integrated Behavioral Health via Telemedicine Visit  05/13/2024 Morgan [name] [id number]  Number of Integrated Behavioral Health Clinician visits: 1- Initial Visit  Session Start time: 1028   Session End time: 1100  Total time in minutes: 32   Referring Provider: Glinda [name], CNM Patient/Family location: Home [facility] Provider location: [facility]  All persons participating in visit: [name] and [facility] Morgan [name]   Types of Service: Individual psychotherapy and Telephone visit  I connected with Morgan [name] via  Telephone or Video Enabled Telemedicine Application  (Video is Caregility application) and verified that I am speaking with the correct person using two identifiers. Discussed confidentiality: Yes   I discussed the limitations of telemedicine and the availability of in person appointments.  Discussed there is a possibility of technology failure and discussed alternative modes of communication if that failure occurs.  I discussed that engaging in this telemedicine visit, they consent to the provision of behavioral healthcare and the services will be billed under their insurance.  Patient and/or legal guardian expressed understanding and consented to Telemedicine visit: Yes   Presenting Concerns: Patient and/or family reports the following symptoms/concerns: Adjusting to new motherhood with low motivation, poor appetite, lack of quality sleep (4-5 hours at night), fatigue, poor concentration, anxiety, worry, restlessness, difficulty relaxing, dread. Pt is coping with good support at home and having a clear plan; pt's plan is to manage anxiety while obtaining a healthy work/school/life balance. Duration of problem: Postpartum increase; Severity of problem: severe  Patient and/or Family's Strengths/Protective Factors: Concrete supports in place (healthy food, safe environments,  etc.) and Sense of purpose  Goals Addressed: Patient will:  Reduce symptoms of: anxiety, depression, and stress   Increase knowledge and/or ability of: healthy habits, self-management skills, and stress reduction   Demonstrate ability to: Increase healthy adjustment to current life circumstances, Increase adequate support systems for patient/family, and Increase motivation to adhere to plan of care  Progress towards Goals: Ongoing  Interventions: Interventions utilized:  Mindfulness or Management consultant, Psychoeducation and/or Health Education, Link to [employer], and Supportive Reflection Standardized Assessments completed: GAD-7 and PHQ 9  Patient and/or Family Response: Patient agrees with treatment plan.    Assessment: Patient currently experiencing Major depressive disorder, recurrent, severe without psychotic features; Generalized anxiety disorder; Psychosocial stress.   Patient may benefit from psychoeducation and brief therapeutic interventions regarding coping with symptoms of anxiety, depression, life stress .  Plan: Follow up with behavioral health clinician on : Three weeks Behavioral recommendations:  -Continue prioritizing healthy self-care (regular meals, adequate rest; allowing practical help from supportive friends and family)  -Consider new mom support group as needed at either www.[email or web address].net or [email or web address]  -CALM relaxation breathing exercise twice daily (morning; at bedtime with sleep sounds); as needed throughout the day. 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 They were provided an opportunity to ask questions and all were answered. They agreed with the plan and demonstrated an understanding of the instructions.   They were advised to call back  or seek an in-person evaluation if the symptoms worsen or if the condition fails to improve as anticipated.  Morgan Kipper, LCSW     05/12/2024   10:44 AM 08/28/2023    3:17 PM 06/25/2023    2:08 PM 03/26/2023    2:07 PM 01/29/2023   11:56 AM  Depression screen PHQ 2/9  Decreased Interest 3 1 2 1 3   Down, Depressed, Hopeless 0 0 0 1 1  PHQ - 2 Score 3 1 2 2 4   Altered sleeping 3 1 2 3 1   Tired, decreased energy 3 3 2 2 3   Change in appetite 3 2 2 3 2   Feeling bad or failure about yourself  1 0 0 0 3  Trouble concentrating 3 0 2 0 2  Moving slowly or fidgety/restless 1 0 0 0 1  Suicidal thoughts 0 0 0 0 0  PHQ-9 Score 17 7 10 10 16       05/12/2024   10:47 AM 08/28/2023    3:18 PM 06/25/2023    2:09 PM 03/26/2023    2:08 PM  GAD 7 : Generalized Anxiety Score  Nervous, Anxious, on Edge 3 1 2  0  Control/stop worrying 3 1 2  0  Worry too much - different things 3 1 2 1   Trouble relaxing 3 1 2 2   Restless 3 2 2 2   Easily annoyed or irritable 2 3 2  0  Afraid - awful might happen 3 0 0 0  Total GAD 7 Score 20 9 12  5

## 2024-05-12 ENCOUNTER — Ambulatory Visit (INDEPENDENT_AMBULATORY_CARE_PROVIDER_SITE_OTHER): Payer: Self-pay | Admitting: Clinical

## 2024-05-12 DIAGNOSIS — F439 Reaction to severe stress, unspecified: Secondary | ICD-10-CM

## 2024-05-12 DIAGNOSIS — F332 Major depressive disorder, recurrent severe without psychotic features: Secondary | ICD-10-CM

## 2024-05-12 DIAGNOSIS — F411 Generalized anxiety disorder: Secondary | ICD-10-CM

## 2024-05-13 NOTE — Patient Instructions (Signed)
 [facility] at [facility] [address] [place], [place] [address] [phone] (main office) [phone] [facility] office)  New Parent Support Groups www.[email or web address].net [email or web address]  [place] [employer]  (Childcare options, Early childcare development, etc.) www.[email or web address].org  [employer] Child Care Facility Search Engine  [email or web address]

## 2024-05-25 ENCOUNTER — Encounter: Payer: Self-pay | Admitting: Obstetrics & Gynecology

## 2024-05-30 ENCOUNTER — Encounter: Payer: Self-pay | Admitting: Obstetrics & Gynecology

## 2024-06-05 ENCOUNTER — Ambulatory Visit: Admitting: Clinical

## 2024-06-05 DIAGNOSIS — Z91199 Patient's noncompliance with other medical treatment and regimen due to unspecified reason: Secondary | ICD-10-CM

## 2024-06-05 NOTE — BH Specialist Note (Signed)
 Pt did not arrive to video visit and did not answer the phone; Left HIPPA-compliant message to call back Carolyn Cisco from [employer] for [employer] at [facility] at  [phone] [facility] office).  ?; left MyChart message for patient.  ? ?

## 2024-06-25 ENCOUNTER — Encounter

## 2024-06-29 ENCOUNTER — Ambulatory Visit: Admitting: Obstetrics & Gynecology

## 2024-07-01 ENCOUNTER — Ambulatory Visit: Admitting: Adult Health

## 2024-07-02 ENCOUNTER — Ambulatory Visit: Admitting: Adult Health

## 2024-07-03 ENCOUNTER — Encounter: Payer: Self-pay | Admitting: Obstetrics & Gynecology

## 2024-07-03 ENCOUNTER — Telehealth: Admitting: Physician Assistant

## 2024-07-03 DIAGNOSIS — M545 Low back pain, unspecified: Secondary | ICD-10-CM | POA: Diagnosis not present

## 2024-07-03 MED ORDER — NAPROXEN 500 MG PO TABS: 500.0000 mg | ORAL_TABLET | Freq: Two times a day (BID) | ORAL | 0 refills | Status: DC

## 2024-07-03 MED ORDER — BACLOFEN 10 MG PO TABS: 10.0000 mg | ORAL_TABLET | Freq: Three times a day (TID) | ORAL | 0 refills | Status: AC

## 2024-07-03 NOTE — Progress Notes (Signed)
 E-Visit for Back Pain   We are sorry that you are not feeling well.  Here is how we plan to help!  Based on what you have shared with me it looks like you mostly have acute back pain.  Acute back pain is defined as musculoskeletal pain that can resolve in 1-3 weeks with conservative treatment.  I have prescribed Naprosyn  500 mg take one by mouth twice a day non-steroid anti-inflammatory (NSAID) as well as Baclofen  10 mg every eight hours as needed which is a muscle relaxer (Do not take if breastfeeding).  Some patients experience stomach irritation or in increased heartburn with anti-inflammatory drugs.  Please keep in mind that muscle relaxer's can cause fatigue and should not be taken while at work or driving.  Back pain is very common.  The pain often gets better over time.  The cause of back pain is usually not dangerous.  Most people can learn to manage their back pain on their own.  You will need to be seen in person at your Primary Care office for the PT orders, STD screen and postpartum check up.  Home Care Stay active.  Start with short walks on flat ground if you can.  Try to walk farther each day. Do not sit, drive or stand in one place for more than 30 minutes.  Do not stay in bed. Do not avoid exercise or work.  Activity can help your back heal faster. Be careful when you bend or lift an object.  Bend at your knees, keep the object close to you, and do not twist. Sleep on a firm mattress.  Lie on your side, and bend your knees.  If you lie on your back, put a pillow under your knees. Only take medicines as told by your doctor. Put ice on the injured area. Put ice in a plastic bag Place a towel between your skin and the bag Leave the ice on for 15-20 minutes, 3-4 times a day for the first 2-3 days. 210 After that, you can switch between ice and heat packs. Ask your doctor about back exercises or massage. Avoid feeling anxious or stressed.  Find good ways to deal with stress, such  as exercise.  Get Help Right Way If: Your pain does not go away with rest or medicine. Your pain does not go away in 1 week. You have new problems. You do not feel well. The pain spreads into your legs. You cannot control when you poop (bowel movement) or pee (urinate) You feel sick to your stomach (nauseous) or throw up (vomit) You have belly (abdominal) pain. You feel like you may pass out (faint). If you develop a fever.  Make Sure you: Understand these instructions. Will watch your condition Will get help right away if you are not doing well or get worse.  Your e-visit answers were reviewed by a board certified advanced clinical practitioner to complete your personal care plan.  Depending on the condition, your plan could have included both over the counter or prescription medications.  If there is a problem please reply  once you have received a response from your provider.  Your safety is important to us .  If you have drug allergies check your prescription carefully.    You can use MyChart to ask questions about today's visit, request a non-urgent call back, or ask for a work or school excuse for 24 hours related to this e-Visit. If it has been greater than 24 hours you will  need to follow up with your provider, or enter a new e-Visit to address those concerns.  You will get an e-mail in the next two days asking about your experience.  I hope that your e-visit has been valuable and will speed your recovery. Thank you for using e-visits.   I have spent 5 minutes in review of e-visit questionnaire, review and updating patient chart, medical decision making and response to patient.   Delon CHRISTELLA Dickinson, PA-C

## 2024-07-07 ENCOUNTER — Ambulatory Visit

## 2024-07-07 ENCOUNTER — Other Ambulatory Visit (HOSPITAL_COMMUNITY)
Admission: RE | Admit: 2024-07-07 | Discharge: 2024-07-07 | Disposition: A | Discharge status: Home / Self Care | Source: Ambulatory Visit | Attending: Obstetrics & Gynecology | Admitting: Obstetrics & Gynecology

## 2024-07-07 DIAGNOSIS — N898 Other specified noninflammatory disorders of vagina: Secondary | ICD-10-CM | POA: Insufficient documentation

## 2024-07-07 NOTE — Progress Notes (Signed)
   NURSE VISIT- VAGINITIS/STD  SUBJECTIVE:  Morgan [name] is a 28 y.o. G2P1011 GYN patientfemale here for a vaginal swab for vaginitis screening, STD screen.  She reports the following symptoms: discharge described as white and curd-like and vulvar itching for 1 month. Denies abnormal vaginal bleeding, significant pelvic pain, fever, or UTI symptoms. Patient also reports having a bump.   OBJECTIVE:  There were no vitals taken for this visit.  Appears well, in no apparent distress  ASSESSMENT: Vaginal swab for STD screen  PLAN: Self-collected vaginal probe for Gonorrhea, Chlamydia, Trichomonas, Bacterial Vaginosis, Yeast sent to lab patient also wanted blood std testing as well labs were placed Regarding bump spoke with Delon Lewis about bump recommended to schedule appointment with provider.  Treatment: to be determined once results are received Follow-up as needed if symptoms persist/worsen, or new symptoms develop  Aleck FORBES Blase  07/07/2024 4:07 PM

## 2024-07-08 ENCOUNTER — Ambulatory Visit: Payer: Self-pay | Admitting: Adult Health

## 2024-07-08 LAB — CERVICOVAGINAL ANCILLARY ONLY
Bacterial Vaginitis (gardnerella): POSITIVE — AB
Candida Glabrata: NEGATIVE
Candida Vaginitis: NEGATIVE
Chlamydia: NEGATIVE
Comment: NEGATIVE
Comment: NEGATIVE
Comment: NEGATIVE
Comment: NEGATIVE
Comment: NEGATIVE
Comment: NORMAL
Neisseria Gonorrhea: NEGATIVE
Trichomonas: NEGATIVE

## 2024-07-08 LAB — RPR: RPR Ser Ql: NONREACTIVE

## 2024-07-08 LAB — HIV ANTIBODY (ROUTINE TESTING W REFLEX): HIV Screen 4th Generation wRfx: NONREACTIVE

## 2024-07-09 ENCOUNTER — Ambulatory Visit: Admitting: Adult Health

## 2024-07-09 MED ORDER — METRONIDAZOLE 500 MG PO TABS: 500.0000 mg | ORAL_TABLET | Freq: Two times a day (BID) | ORAL | 0 refills | Status: DC

## 2024-07-24 ENCOUNTER — Encounter: Payer: Self-pay | Admitting: Obstetrics & Gynecology

## 2024-08-12 ENCOUNTER — Encounter: Payer: Self-pay | Admitting: Obstetrics & Gynecology

## 2024-08-13 ENCOUNTER — Ambulatory Visit: Admitting: *Deleted

## 2024-08-13 ENCOUNTER — Other Ambulatory Visit (HOSPITAL_COMMUNITY)
Admission: RE | Admit: 2024-08-13 | Discharge: 2024-08-13 | Disposition: A | Discharge status: Home / Self Care | Source: Ambulatory Visit | Attending: Obstetrics & Gynecology | Admitting: Obstetrics & Gynecology

## 2024-08-13 DIAGNOSIS — R109 Unspecified abdominal pain: Secondary | ICD-10-CM

## 2024-08-13 DIAGNOSIS — R8290 Unspecified abnormal findings in urine: Secondary | ICD-10-CM

## 2024-08-13 DIAGNOSIS — R3915 Urgency of urination: Secondary | ICD-10-CM | POA: Diagnosis not present

## 2024-08-13 LAB — POCT URINALYSIS DIPSTICK OB
Glucose, UA: NEGATIVE
Ketones, UA: NEGATIVE
Leukocytes, UA: NEGATIVE
Nitrite, UA: NEGATIVE
POC,PROTEIN,UA: NEGATIVE

## 2024-08-13 NOTE — Progress Notes (Signed)
   NURSE VISIT- UTI SYMPTOMS   SUBJECTIVE:  Morgan [name] is a 28 y.o. G21P1011 female here for UTI symptoms. She is a GYN patient. She reports lower abdominal pain, urinary urgency, and abnormal urine odor. Recently had BV in July and wants to recheck that it has gone.   OBJECTIVE:  There were no vitals taken for this visit.  Appears well, in no apparent distress  Results for orders placed or performed in visit on 08/13/24 (from the past 24 hours)  POC Urinalysis Dipstick OB   Collection Time: 08/13/24 11:59 AM  Result Value Ref Range   Color, UA     Clarity, UA     Glucose, UA Negative Negative   Bilirubin, UA     Ketones, UA neg    Spec Grav, UA     Blood, UA moderate    pH, UA     POC,PROTEIN,UA Negative Negative, Trace, Small (1+), Moderate (2+), Large (3+), 4+   Urobilinogen, UA     Nitrite, UA neg    Leukocytes, UA Negative Negative   Appearance     Odor      ASSESSMENT: GYN patient with UTI symptoms and negative nitrites  PLAN: Note routed to Delon Lewis, AGNP   Rx sent by provider today: No Urine culture sent CV swab sent to BV, yeast only Call or return to clinic prn if these symptoms worsen or fail to improve as anticipated. Follow-up: as needed   Morgan [name]  08/13/2024 12:04 PM

## 2024-08-14 ENCOUNTER — Ambulatory Visit: Payer: Self-pay | Admitting: Adult Health

## 2024-08-14 LAB — MICROSCOPIC EXAMINATION: Casts: NONE SEEN /LPF

## 2024-08-14 LAB — URINALYSIS, ROUTINE W REFLEX MICROSCOPIC
Bilirubin, UA: NEGATIVE
Glucose, UA: NEGATIVE
Ketones, UA: NEGATIVE
Leukocytes,UA: NEGATIVE
Nitrite, UA: POSITIVE — AB
Protein,UA: NEGATIVE
Specific Gravity, UA: 1.013 (ref 1.005–1.030)
Urobilinogen, Ur: 0.2 mg/dL (ref 0.2–1.0)
pH, UA: 6 (ref 5.0–7.5)

## 2024-08-14 LAB — CERVICOVAGINAL ANCILLARY ONLY
Bacterial Vaginitis (gardnerella): NEGATIVE
Candida Glabrata: NEGATIVE
Candida Vaginitis: NEGATIVE
Comment: NEGATIVE
Comment: NEGATIVE
Comment: NEGATIVE

## 2024-08-17 LAB — URINE CULTURE

## 2024-08-17 MED ORDER — NITROFURANTOIN MONOHYD MACRO 100 MG PO CAPS: 100.0000 mg | ORAL_CAPSULE | Freq: Two times a day (BID) | ORAL | 0 refills | Status: DC

## 2024-10-27 ENCOUNTER — Encounter: Payer: Self-pay | Admitting: Obstetrics & Gynecology

## 2024-10-28 ENCOUNTER — Ambulatory Visit (INDEPENDENT_AMBULATORY_CARE_PROVIDER_SITE_OTHER): Admitting: Advanced Practice Midwife

## 2024-10-28 ENCOUNTER — Other Ambulatory Visit (HOSPITAL_COMMUNITY)
Admission: RE | Admit: 2024-10-28 | Discharge: 2024-10-28 | Disposition: A | Discharge status: Home / Self Care | Source: Ambulatory Visit | Attending: Advanced Practice Midwife | Admitting: Advanced Practice Midwife

## 2024-10-28 ENCOUNTER — Encounter: Payer: Self-pay | Admitting: Advanced Practice Midwife

## 2024-10-28 VITALS — BP 113/61 | HR 88 | Ht 61.5 in | Wt 146.0 lb

## 2024-10-28 DIAGNOSIS — R531 Weakness: Secondary | ICD-10-CM

## 2024-10-28 DIAGNOSIS — R102 Pelvic and perineal pain unspecified side: Secondary | ICD-10-CM | POA: Insufficient documentation

## 2024-10-28 LAB — POCT URINALYSIS DIPSTICK OB
Glucose, UA: NEGATIVE
Ketones, UA: NEGATIVE
Leukocytes, UA: NEGATIVE
Nitrite, UA: NEGATIVE

## 2024-10-28 NOTE — Progress Notes (Addendum)
 GYN VISIT [name] MRN [id number]  Date of birth: Oct 23, 1996 Chief Complaint:   Vaginal Discharge (Also having pain and weakness)  History of Present Illness:   Morgan [name] is a 28 y.o. G43P1011 mixed race female being seen today for onset of bilat pelvic pain x 3 weeks.  Has been getting progressively worse. Noted mucousy vag d/c recently but no odor; starting cycle today with previous LMP 9/23 (irreg cycles since delivery in March 2025). Also reports diarrhea; no fever or dysuria. Having back pain around epidural site that radiates around and also reports generalized weakness. Doesn't have PCP.  No LMP recorded. The current method of family planning is Phexx.  Last pap Jan 2023. Results were: NILM w/ HRHPV not done     05/12/2024   10:44 AM 08/28/2023    3:17 PM 06/25/2023    2:08 PM 03/26/2023    2:07 PM 01/29/2023   11:56 AM  Depression screen PHQ 2/9  Decreased Interest 3 1 2 1 3   Down, Depressed, Hopeless 0 0 0 1 1  PHQ - 2 Score 3 1 2 2 4   Altered sleeping 3 1 2 3 1   Tired, decreased energy 3 3 2 2 3   Change in appetite 3 2 2 3 2   Feeling bad or failure about yourself  1 0 0 0 3  Trouble concentrating 3 0 2 0 2  Moving slowly or fidgety/restless 1 0 0 0 1  Suicidal thoughts 0 0 0 0 0  PHQ-9 Score 17 7 10 10 16         05/12/2024   10:47 AM 08/28/2023    3:18 PM 06/25/2023    2:09 PM 03/26/2023    2:08 PM  GAD 7 : Generalized Anxiety Score  Nervous, Anxious, on Edge 3 1 2  0  Control/stop worrying 3 1 2  0  Worry too much - different things 3 1 2 1   Trouble relaxing 3 1 2 2   Restless 3 2 2 2   Easily annoyed or irritable 2 3 2  0  Afraid - awful might happen 3 0 0 0  Total GAD 7 Score 20 9 12 5      Review of Systems:   Pertinent items are noted in HPI Denies fever/chills, dizziness, headaches, visual disturbances, fatigue, shortness of breath, chest pain, abdominal pain, vomiting, urination, or intercourse unless otherwise stated above.  Pertinent  History Reviewed:  Reviewed past medical,surgical, social, obstetrical and family history.  Reviewed problem list, medications and allergies. Physical Assessment:   Vitals:   10/28/24 1425  BP: 113/61  Pulse: 88  Weight: 146 lb (66.2 kg)  Height: 5' 1.5 (1.562 m)  Body mass index is 27.14 kg/m.       Physical Examination:   General appearance: alert, well appearing, and in no distress  Mental status: alert, oriented to person, place, and time  Skin: warm & dry   Cardiovascular: normal heart rate noted  Respiratory: normal respiratory effort, no distress  Abdomen: soft, non-tender   Pelvic: normal external genitalia, vulva, vagina, cervix, uterus and adnexa; sm amt of red blood in vault; reports L sided tenderness on bimanual exam- none noted on R  Extremities: no edema   Chaperone: Alan Fischer  Results for orders placed or performed in visit on 10/28/24 (from the past 24 hours)  POC Urinalysis Dipstick OB   Collection Time: 10/28/24  2:58 PM  Result Value Ref Range   Color, UA     Clarity,  UA     Glucose, UA Negative Negative   Bilirubin, UA     Ketones, UA neg    Spec Grav, UA     Blood, UA large    pH, UA     POC,PROTEIN,UA Small (1+) Negative, Trace, Small (1+), Moderate (2+), Large (3+), 4+   Urobilinogen, UA     Nitrite, UA neg    Leukocytes, UA Negative Negative   Appearance     Odor      Assessment & Plan:  1) L pelvic pain> CV swab today; UA with blood and protein but no s/s infection; pelvic u/s ordered  2) Back pain and generalized weakness> referral for PCP ordered  Meds: No orders of the defined types were placed in this encounter.   Orders Placed This Encounter  Procedures   US  PELVIC COMPLETE WITH TRANSVAGINAL   Ambulatory referral to Family Practice   POC Urinalysis Dipstick OB    Return for first avail pelvic u/s.  Suzen JONETTA Gentry CNM 10/28/2024 3:11 PM

## 2024-10-30 LAB — CERVICOVAGINAL ANCILLARY ONLY
Bacterial Vaginitis (gardnerella): NEGATIVE
Candida Glabrata: NEGATIVE
Candida Vaginitis: NEGATIVE
Chlamydia: NEGATIVE
Comment: NEGATIVE
Comment: NEGATIVE
Comment: NEGATIVE
Comment: NEGATIVE
Comment: NEGATIVE
Comment: NORMAL
Neisseria Gonorrhea: NEGATIVE
Trichomonas: NEGATIVE

## 2024-11-01 ENCOUNTER — Ambulatory Visit: Payer: Self-pay | Admitting: Advanced Practice Midwife

## 2024-11-04 ENCOUNTER — Ambulatory Visit (INDEPENDENT_AMBULATORY_CARE_PROVIDER_SITE_OTHER)

## 2024-11-04 DIAGNOSIS — R102 Pelvic and perineal pain unspecified side: Secondary | ICD-10-CM | POA: Diagnosis not present

## 2024-11-04 NOTE — Progress Notes (Signed)
 PELVIC TA/TV: homogeneous anteverted uterus,WNL,EEC 7.84 mm,normal ovaries,no free fluid,pelvic pain during ultrasound,ovaries appear mobile

## 2024-12-22 ENCOUNTER — Ambulatory Visit: Payer: Self-pay

## 2025-01-07 ENCOUNTER — Ambulatory Visit

## 2025-01-07 ENCOUNTER — Ambulatory Visit: Admitting: Adult Health

## 2025-01-07 ENCOUNTER — Other Ambulatory Visit (HOSPITAL_COMMUNITY)
Admission: RE | Admit: 2025-01-07 | Discharge: 2025-01-07 | Disposition: A | Source: Ambulatory Visit | Attending: Obstetrics & Gynecology | Admitting: Obstetrics & Gynecology

## 2025-01-07 ENCOUNTER — Encounter: Payer: Self-pay | Admitting: Adult Health

## 2025-01-07 VITALS — BP 110/67 | HR 82 | Ht 62.0 in | Wt 139.0 lb

## 2025-01-07 VITALS — BP 105/75 | HR 72 | Temp 98.1°F | Ht 62.0 in | Wt 140.6 lb

## 2025-01-07 DIAGNOSIS — G8929 Other chronic pain: Secondary | ICD-10-CM

## 2025-01-07 DIAGNOSIS — Z3202 Encounter for pregnancy test, result negative: Secondary | ICD-10-CM | POA: Diagnosis not present

## 2025-01-07 DIAGNOSIS — F32A Depression, unspecified: Secondary | ICD-10-CM

## 2025-01-07 DIAGNOSIS — F419 Anxiety disorder, unspecified: Secondary | ICD-10-CM | POA: Diagnosis not present

## 2025-01-07 DIAGNOSIS — M5441 Lumbago with sciatica, right side: Secondary | ICD-10-CM | POA: Diagnosis not present

## 2025-01-07 DIAGNOSIS — Z0000 Encounter for general adult medical examination without abnormal findings: Secondary | ICD-10-CM | POA: Insufficient documentation

## 2025-01-07 DIAGNOSIS — Z13220 Encounter for screening for lipoid disorders: Secondary | ICD-10-CM

## 2025-01-07 DIAGNOSIS — R350 Frequency of micturition: Secondary | ICD-10-CM | POA: Diagnosis not present

## 2025-01-07 DIAGNOSIS — N898 Other specified noninflammatory disorders of vagina: Secondary | ICD-10-CM | POA: Diagnosis not present

## 2025-01-07 DIAGNOSIS — M5442 Lumbago with sciatica, left side: Secondary | ICD-10-CM | POA: Diagnosis not present

## 2025-01-07 DIAGNOSIS — F418 Other specified anxiety disorders: Secondary | ICD-10-CM

## 2025-01-07 DIAGNOSIS — M549 Dorsalgia, unspecified: Secondary | ICD-10-CM | POA: Diagnosis not present

## 2025-01-07 DIAGNOSIS — R058 Other specified cough: Secondary | ICD-10-CM | POA: Diagnosis not present

## 2025-01-07 DIAGNOSIS — M545 Low back pain, unspecified: Secondary | ICD-10-CM | POA: Diagnosis not present

## 2025-01-07 DIAGNOSIS — Z01419 Encounter for gynecological examination (general) (routine) without abnormal findings: Secondary | ICD-10-CM

## 2025-01-07 DIAGNOSIS — J069 Acute upper respiratory infection, unspecified: Secondary | ICD-10-CM | POA: Diagnosis not present

## 2025-01-07 DIAGNOSIS — R5383 Other fatigue: Secondary | ICD-10-CM

## 2025-01-07 DIAGNOSIS — R102 Pelvic and perineal pain unspecified side: Secondary | ICD-10-CM

## 2025-01-07 LAB — POCT URINALYSIS DIPSTICK
Blood, UA: NEGATIVE
Glucose, UA: NEGATIVE
Ketones, UA: NEGATIVE
Leukocytes, UA: NEGATIVE
Nitrite, UA: NEGATIVE
Protein, UA: NEGATIVE

## 2025-01-07 LAB — POCT URINE PREGNANCY: Preg Test, Ur: NEGATIVE

## 2025-01-07 MED ORDER — ALBUTEROL SULFATE HFA 108 (90 BASE) MCG/ACT IN AERS: 2.0000 | INHALATION_SPRAY | Freq: Four times a day (QID) | RESPIRATORY_TRACT | 2 refills | Status: AC | PRN

## 2025-01-07 MED ORDER — AMOXICILLIN-POT CLAVULANATE 875-125 MG PO TABS: 1.0000 | ORAL_TABLET | Freq: Two times a day (BID) | ORAL | 0 refills | Status: AC

## 2025-01-07 MED ORDER — SERTRALINE HCL 50 MG PO TABS: 50.0000 mg | ORAL_TABLET | Freq: Every day | ORAL | 1 refills | Status: AC

## 2025-01-07 NOTE — Patient Instructions (Signed)
 [facility] [phone]

## 2025-01-07 NOTE — Progress Notes (Signed)
 "  New Patient Office Visit  Subjective    Patient ID: Morgan [name], female    DOB: [birth year]  Age: 29 y.o. MRN: [id number]  HPI Morgan [name] presents to establish care.   Has a history of anxiety and depression.  Is 10 months postpartum and has had ongoing issues with anxiety and depression.  Is currently living with her mom, due to renovations at her house.  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  Currently driving 3 hours a day for her commute.  Having a decreased appetite due to anxiety, and is struggling gaining weight.  Said that she used to be on 160 pounds and was good with that weight.  Is not currently breastfeeding.  Has tried journaling to manage her anxiety.  Has also tried Lexapro  in the past and it affected her appetite.  Endorses fatigue.  Says that sleep is okay but she does work night shift.  Has had increased back pain since having her son and having an epidural.  Reports that her back sometimes locks up while she is lifting patients at work.  Pain is mainly in the mid back and runs down to her lower back and hip area.  Describes the pain that runs down her bilateral hips as shock-like. Endorses numbness and tingling in the lower back.  Denies loss of bowel or bladder function.   Also has a history of sinus congestion, cough, increased pressure and chest for 3 weeks now.  Says that son had rhinovirus 3 weeks ago.  Was seen in urgent care and diagnosed with a upper respiratory virus.  Denies any nasal drainage.  Cough is productive with yellow to green mucus.  Reports that cough does increase at nighttime.  Denies fever, body aches, ear pain or body chills.  Reports that she has been in contact with the flu, as she works in the emergency department.  Past Medical History:  Diagnosis Date   Anxiety    Chlamydia 01/28/2020   Treated 01/28/20, POC___________   Depression     Dizziness and giddiness    GERD (gastroesophageal reflux disease)    Miscarriage     Past Surgical History:  Procedure Laterality Date   ROOT CANAL  03/11/2017    Family History  Problem Relation Age of Onset   Cancer Paternal Grandmother    Stroke Paternal Grandmother    Heart attack Paternal Grandmother    Diabetes Paternal Grandmother    Diabetes Maternal Grandmother    Diabetes Father    Diabetes Paternal Aunt    Cancer Paternal Aunt    Alzheimer's disease Paternal Aunt    Diabetes Other     Social History   Socioeconomic History   Marital status: Significant Other    Spouse name: Not on file   Number of children: Not on file   Years of education: Not on file   Highest education level: Not on file  Occupational History   Not on file  Tobacco Use   Smoking status: Former    Types: Cigarettes, Cigars   Smokeless tobacco: Never  Vaping Use   Vaping status: Former  Substance and Sexual Activity   Alcohol use: Not Currently    Comment: occ   Drug use: Not Currently    Types: Marijuana   Sexual activity: Not Currently    Birth control/protection: None, Condom  Other Topics Concern  Not on file  Social History Narrative   No caffeine   Social Drivers of Health   Tobacco Use: Medium Risk (01/07/2025)   Patient History    Smoking Tobacco Use: Former    Smokeless Tobacco Use: Never    Passive Exposure: Not on file  Financial Resource Strain: Medium Risk (01/07/2025)   Overall Financial Resource Strain (CARDIA)    Difficulty of Paying Living Expenses: Somewhat hard  Food Insecurity: Food Insecurity Present (01/07/2025)   Epic    Worried About Programme Researcher, Broadcasting/film/video in the Last Year: Sometimes true    Ran Out of Food in the Last Year: Sometimes true  Transportation Needs: No Transportation Needs (01/07/2025)   Epic    Lack of Transportation (Medical): No    Lack of Transportation (Non-Medical): No  Physical Activity: Insufficiently Active (01/07/2025)    Exercise Vital Sign    Days of Exercise per Week: 1 day    Minutes of Exercise per Session: 30 min  Stress: Stress Concern Present (01/07/2025)   [employer] of Occupational Health - Occupational Stress Questionnaire    Feeling of Stress: Very much  Social Connections: Moderately Isolated (01/07/2025)   Social Connection and Isolation Panel    Frequency of Communication with Friends and Family: Twice a week    Frequency of Social Gatherings with Friends and Family: Never    Attends Religious Services: 1 to 4 times per year    Active Member of Clubs or Organizations: Yes    Attends Banker Meetings: 1 to 4 times per year    Marital Status: Separated  Intimate Partner Violence: Not At Risk (01/07/2025)   Epic    Fear of Current or Ex-Partner: No    Emotionally Abused: No    Physically Abused: No    Sexually Abused: No  Depression (PHQ2-9): High Risk (01/07/2025)   Depression (PHQ2-9)    PHQ-2 Score: 25  Alcohol Screen: Low Risk (01/07/2025)   Alcohol Screen    Last Alcohol Screening Score (AUDIT): 1  Housing: High Risk (01/07/2025)   Epic    Unable to Pay for Housing in the Last Year: Yes    Number of Times Moved in the Last Year: 0    Homeless in the Last Year: Yes  Utilities: At Risk (01/07/2025)   Epic    Threatened with loss of utilities: Already shut off  Health Literacy: Adequate Health Literacy (01/07/2025)   B1300 Health Literacy    Frequency of need for help with medical instructions: Rarely    Review of Systems   Objective    BP 105/75   Pulse 72   Temp 98.1 F (36.7 C)   Ht 5' 2 (1.575 m)   Wt 140 lb 9.6 oz (63.8 kg)   LMP 12/19/2024 (Approximate)   SpO2 100%   BMI 25.72 kg/m   Physical Exam Vitals and nursing note reviewed.  Constitutional:      General: She is not in acute distress.    Appearance: Normal appearance. She is not ill-appearing.  HENT:     Right Ear: Tympanic membrane, ear canal and external ear normal.     Left Ear:  Tympanic membrane, ear canal and external ear normal.     Nose: Congestion present. No rhinorrhea.     Mouth/Throat:     Mouth: Mucous membranes are moist.     Pharynx: No oropharyngeal exudate or posterior oropharyngeal erythema.  Cardiovascular:     Rate and Rhythm: Normal rate and regular  rhythm.     Heart sounds: Normal heart sounds, S1 normal and S2 normal. No murmur heard. Pulmonary:     Effort: Pulmonary effort is normal. No respiratory distress.     Breath sounds: Normal breath sounds. No wheezing.  Musculoskeletal:     Lumbar back: Negative right straight leg raise test and negative left straight leg raise test.     Right lower leg: No edema.     Left lower leg: No edema.     Comments: Thoracic spine/mid-back: No ecchymosis, edema, or open wounds noted. Direct spine and paraspinous tenderness palpated on exam. Limited ROM with flexion, extension, lateral flexion, and rotation of lumbar spine with pain elicited.  Lumbar spine/lower back: No ecchymosis, edema, or open wounds noted. Direct spine and paraspinous tenderness palpated on exam. Limited ROM with flexion, extension, lateral flexion, and rotation of lumbar spine with pain elicited. Strength 5+.  Gait antalgic due to pain. BLE: Full ROM and strength of BLE. Sensation intact.    Neurological:     Mental Status: She is alert.  Psychiatric:        Mood and Affect: Mood normal.        Behavior: Behavior normal.        Thought Content: Thought content normal.        Judgment: Judgment normal.       01/07/2025    3:19 PM 01/07/2025   10:47 AM 05/12/2024   10:44 AM 08/28/2023    3:17 PM 06/25/2023    2:08 PM  Depression screen PHQ 2/9  Decreased Interest 3 3 3 1 2   Down, Depressed, Hopeless 3 3 0 0 0  PHQ - 2 Score 6 6 3 1 2   Altered sleeping 2 2 3 1 2   Tired, decreased energy 3 3 3 3 2   Change in appetite 3 3 3 2 2   Feeling bad or failure about yourself  3 3 1  0 0  Trouble concentrating 3 3 3  0 2  Moving slowly or  fidgety/restless 2 2 1  0 0  Suicidal thoughts 3 0 0 0 0  PHQ-9 Score 25 22 17  7  10    Difficult doing work/chores Extremely dIfficult         Data saved with a previous flowsheet row definition      01/07/2025    3:19 PM 01/07/2025   10:48 AM 05/12/2024   10:47 AM 08/28/2023    3:18 PM  GAD 7 : Generalized Anxiety Score  Nervous, Anxious, on Edge 3 3 3 1   Control/stop worrying 3 3 3 1   Worry too much - different things 3 3 3 1   Trouble relaxing 3 3 3 1   Restless 3 3 3 2   Easily annoyed or irritable 3 3 2 3   Afraid - awful might happen 3 2 3  0  Total GAD 7 Score 21 20 20 9   Anxiety Difficulty Extremely difficult      Assessment & Plan:  1. Anxiety and depression (Primary) -Recommended starting antidepressant at this time due to high anxiety and depression scores.  Patient agreed for me to initiate Zoloft . -Recommended counseling at this time and provided patient with contact information for [facility] in [place]. -Advised patient that this may take up to 4-6 weeks for the medication to start working.  -Educated patient on appropriate use and potential side effects such as mild GI effects and headache, which should resolve after two weeks.  -Gave box warning of suicidal ideations, and advised patient  that if he experiences this to stop the medication immediately and seek care. Patient agrees.   - Comprehensive metabolic panel with GFR - sertraline  (ZOLOFT ) 50 MG tablet; Take 1 tablet (50 mg total) by mouth daily. Take half tab (25 mg) for 7 days, then increase to 1 tab (50 mg) by mouth daily.  Dispense: 30 tablet; Refill: 1  2. Fatigue, unspecified type - Comprehensive metabolic panel with GFR - TSH - T4, free - CBC with Differential/Platelet  3. Acute bilateral low back pain with bilateral sciatica -Patient has limited range of motion of lumbar spine due to pain on exam.  Straight leg test was negative.  Due to persistence of symptoms, direct spine tenderness and  presence of sciatica, advised patient that a lumbar x-ray is warranted at this time. - DG Lumbar Spine Complete  4. Mid back pain, chronic -Patient has limited range of motion of thoracic spine due to pain on exam.  Due to persistence of symptoms and direct spine tenderness, advised patient that a lumbar x-ray is warranted at this time. - DG Thoracic Spine 2 View  5. Screening for lipid disorders - Lipid panel  6. Upper respiratory tract infection, unspecified type -Due to duration of symptoms being for over 3 weeks will treat for bacterial upper respiratory infection at this time with antibiotics.  -Supportive care discussed (hydration, nasal saline, analgesics as appropriate). -Patient advised to complete full antibiotic course and return if symptoms worsen or fail to improve. -ER precautions reviewed.   -Advised patient that if she experiences double worsening, fever, body aches or chills, to let us  know.   - amoxicillin -clavulanate (AUGMENTIN ) 875-125 MG tablet; Take 1 tablet by mouth 2 (two) times daily.  Dispense: 14 tablet; Refill: 0  7. Spasmodic cough -Patient looks good on exam today, with normal lung sounds. Advised patient on the use of albuterol  for spasmodic cough at night.  -Advised patient to use albuterol  nebulizer only as needed for acute shortness of breath or wheezing. Discussed that daily or excessive use may lead to decreased bronchodilator effectiveness over time.  -ED precautions reviewed including continuous shortness of breath or wheezing despite albuterol  inhaler use or cyanosis, and to seek immediate care if experiencing any of these symptoms.   - albuterol  (VENTOLIN  HFA) 108 (90 Base) MCG/ACT inhaler; Inhale 2 puffs into the lungs every 6 (six) hours as needed for wheezing or shortness of breath.  Dispense: 8 g; Refill: 2   Return in about 6 weeks (around 02/18/2025).   Damien KATHEE Pringle, FNP  "

## 2025-01-07 NOTE — Progress Notes (Addendum)
 Patient ID: Morgan [name], female   DOB: 11/25/1996, 29 y.o.   MRN: [id number] History of Present Illness: Morgan [name] is a 29 year old female, with SO, G2P1011, in for well woman gyn exam and pap. 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   PCP is Morgan Pringle NP   Current Medications, Allergies, Past Medical History, Past Surgical History, Family History and Social History were reviewed in [employer] record.     Review of Systems:  Patient denies any headaches, hearing loss, fatigue, blurred vision, shortness of breath, chest pain, abdominal pain, problems with bowel movements,  or intercourse(not currently active). No joint pain or mood swings.  Has urinary frequency x 1 week  +vaginal discharge Has low back pain and it moves, to see Morgan [name] today +stress, has had to move out of her house, it has mold, living with her mom   Physical Exam:BP 110/67 (BP Location: Right Arm, Patient Position: Sitting, Cuff Size: Normal)   Pulse 82   Ht 5' 2 (1.575 m)   Wt 139 lb (63 kg)   LMP 12/19/2024 (Approximate)   Breastfeeding No   BMI 25.42 kg/m  UPT is negative, urine dipstick is negative  General:  Well developed, well nourished, no acute distress Skin:  Warm and dry Neck:  Midline trachea, normal thyroid, good ROM, no lymphadenopathy Lungs; Clear to auscultation bilaterally Breast:  No dominant palpable mass, retraction, or nipple discharge Cardiovascular: Regular rate and rhythm Abdomen:  Soft, mildly tender, to right of navel, no hepatosplenomegaly Pelvic:  External genitalia is normal in appearance, no lesions.  The vagina is normal in appearance, scant white discharge, had tenderness posterior vaginal wall.  Urethra has no lesions or masses. The cervix is bulbous, pap with GC/CHL and HR HPV genotyping performed.  Uterus is felt to be normal size, shape, and contour.  No adnexal masses or tenderness noted.Bladder is non tender, no masses felt. Extremities/musculoskeletal:  No  swelling or varicosities noted, no clubbing or cyanosis Psych:  No mood changes, alert and cooperative,seems happy AA is 1 Fall risk is low    01/07/2025   10:47 AM 05/12/2024   10:44 AM 08/28/2023    3:17 PM  Depression screen PHQ 2/9  Decreased Interest 3 3 1   Down, Depressed, Hopeless 3 0 0  PHQ - 2 Score 6 3 1   Altered sleeping 2 3 1   Tired, decreased energy 3 3 3   Change in appetite 3 3 2   Feeling bad or failure about yourself  3 1 0  Trouble concentrating 3 3 0  Moving slowly or fidgety/restless 2 1 0  Suicidal thoughts 0 0 0  PHQ-9 Score 22 17  7       Data saved with a previous flowsheet row definition       01/07/2025   10:48 AM 05/12/2024   10:47 AM 08/28/2023    3:18 PM 06/25/2023    2:09 PM  GAD 7 : Generalized Anxiety Score  Nervous, Anxious, on Edge 3 3 1 2   Control/stop worrying 3 3 1 2   Worry too much - different things 3 3 1 2   Trouble relaxing 3 3 1 2   Restless 3 3 2 2   Easily annoyed or irritable 3 2 3 2   Afraid - awful might happen 2 3 0 0  Total GAD 7 Score 20 20 9 12     Upstream - 01/07/25 1042       Pregnancy Intention Screening   Does the patient  want to become pregnant in the next year? No    Does the patient's partner want to become pregnant in the next year? No    Would the patient like to discuss contraceptive options today? No      Contraception Wrap Up   Current Method Female Condom;Abstinence    End Method Female Condom           Examination chaperoned by Clarita Salt LPN   Impression and plan: 1. Urinary frequency Urine dipstick was negative  - POCT Urinalysis Dipstick  2. Negative pregnancy test - POCT urine pregnancy  3. Routine general medical examination at a health care facility (Primary) Pap sent Pap in 3 years if negative  Physical in 1 year  - Cytology - PAP( [place])  4. Anxiety and depression +stress at home, overwhelmed, discussed food banks at local churches  Denies SI or HI Talk more with PCP today  5.  Midline low back pain without sciatica, unspecified chronicity Has pain, low back but moves to higher location at times Has appt with Morgan [name] today at 3:20 pm   6. Vaginal discharge  7. Vaginal pain  Was tender posterior wall

## 2025-01-08 LAB — CYTOLOGY - PAP
Chlamydia: NEGATIVE
Comment: NEGATIVE
Comment: NEGATIVE
Comment: NORMAL
Diagnosis: NEGATIVE
High risk HPV: NEGATIVE
Neisseria Gonorrhea: NEGATIVE

## 2025-01-11 ENCOUNTER — Ambulatory Visit: Payer: Self-pay

## 2025-01-11 ENCOUNTER — Ambulatory Visit (HOSPITAL_COMMUNITY)
Admission: RE | Admit: 2025-01-11 | Discharge: 2025-01-11 | Disposition: A | Discharge status: Home / Self Care | Source: Ambulatory Visit

## 2025-01-11 ENCOUNTER — Ambulatory Visit: Payer: Self-pay | Admitting: Adult Health

## 2025-01-11 ENCOUNTER — Other Ambulatory Visit (HOSPITAL_COMMUNITY): Admission: RE | Admit: 2025-01-11 | Discharge: 2025-01-11 | Disposition: A | Source: Ambulatory Visit

## 2025-01-11 DIAGNOSIS — G8929 Other chronic pain: Secondary | ICD-10-CM | POA: Diagnosis present

## 2025-01-11 DIAGNOSIS — M5442 Lumbago with sciatica, left side: Secondary | ICD-10-CM | POA: Diagnosis present

## 2025-01-11 DIAGNOSIS — M549 Dorsalgia, unspecified: Secondary | ICD-10-CM | POA: Insufficient documentation

## 2025-01-11 DIAGNOSIS — M5441 Lumbago with sciatica, right side: Secondary | ICD-10-CM | POA: Insufficient documentation

## 2025-01-11 LAB — CBC WITH DIFFERENTIAL/PLATELET
Abs Immature Granulocytes: 0.02 K/uL (ref 0.00–0.07)
Basophils Absolute: 0.1 K/uL (ref 0.0–0.1)
Basophils Relative: 1 %
Eosinophils Absolute: 0.3 K/uL (ref 0.0–0.5)
Eosinophils Relative: 4 %
HCT: 40.4 % (ref 36.0–46.0)
Hemoglobin: 13.7 g/dL (ref 12.0–15.0)
Immature Granulocytes: 0 %
Lymphocytes Relative: 28 %
Lymphs Abs: 1.7 K/uL (ref 0.7–4.0)
MCH: 31.6 pg (ref 26.0–34.0)
MCHC: 33.9 g/dL (ref 30.0–36.0)
MCV: 93.3 fL (ref 80.0–100.0)
Monocytes Absolute: 0.5 K/uL (ref 0.1–1.0)
Monocytes Relative: 8 %
Neutro Abs: 3.7 K/uL (ref 1.7–7.7)
Neutrophils Relative %: 59 %
Platelets: 259 K/uL (ref 150–400)
RBC: 4.33 MIL/uL (ref 3.87–5.11)
RDW: 11.8 % (ref 11.5–15.5)
WBC: 6.3 K/uL (ref 4.0–10.5)
nRBC: 0 % (ref 0.0–0.2)

## 2025-01-11 LAB — COMPREHENSIVE METABOLIC PANEL WITH GFR
ALT: 7 U/L (ref 0–44)
AST: 19 U/L (ref 15–41)
Albumin: 4.6 g/dL (ref 3.5–5.0)
Alkaline Phosphatase: 98 U/L (ref 38–126)
Anion gap: 13 (ref 5–15)
BUN: 10 mg/dL (ref 6–20)
CO2: 23 mmol/L (ref 22–32)
Calcium: 9.3 mg/dL (ref 8.9–10.3)
Chloride: 103 mmol/L (ref 98–111)
Creatinine, Ser: 0.85 mg/dL (ref 0.44–1.00)
GFR, Estimated: >60 mL/min
Glucose, Bld: 93 mg/dL (ref 70–99)
Potassium: 3.7 mmol/L (ref 3.5–5.1)
Sodium: 138 mmol/L (ref 135–145)
Total Bilirubin: 0.4 mg/dL (ref 0.0–1.2)
Total Protein: 7.7 g/dL (ref 6.5–8.1)

## 2025-01-11 LAB — LIPID PANEL
Cholesterol: 120 mg/dL (ref 0–200)
HDL: 55 mg/dL
LDL Cholesterol: 57 mg/dL (ref 0–99)
Total CHOL/HDL Ratio: 2.2 ratio
Triglycerides: 40 mg/dL
VLDL: 8 mg/dL (ref 0–40)

## 2025-01-11 LAB — TSH: TSH: 1.21 u[IU]/mL (ref 0.350–4.500)

## 2025-01-11 LAB — T4, FREE: Free T4: 1.38 ng/dL (ref 0.80–2.00)

## 2025-02-18 ENCOUNTER — Ambulatory Visit
